# Patient Record
Sex: Female | Born: 1965 | Race: Black or African American | Hispanic: No | Marital: Single | Smoking: Current every day smoker
Health system: Southern US, Community
[De-identification: ages and names within clinical notes are randomized; demographics above are authoritative.]

## PROBLEM LIST (undated history)

## (undated) DIAGNOSIS — I219 Acute myocardial infarction, unspecified: Secondary | ICD-10-CM

## (undated) DIAGNOSIS — G5600 Carpal tunnel syndrome, unspecified upper limb: Secondary | ICD-10-CM

## (undated) DIAGNOSIS — G8929 Other chronic pain: Secondary | ICD-10-CM

## (undated) DIAGNOSIS — M549 Dorsalgia, unspecified: Secondary | ICD-10-CM

## (undated) DIAGNOSIS — R569 Unspecified convulsions: Secondary | ICD-10-CM

## (undated) DIAGNOSIS — K219 Gastro-esophageal reflux disease without esophagitis: Secondary | ICD-10-CM

## (undated) DIAGNOSIS — Z87442 Personal history of urinary calculi: Secondary | ICD-10-CM

## (undated) DIAGNOSIS — D649 Anemia, unspecified: Secondary | ICD-10-CM

## (undated) DIAGNOSIS — I10 Essential (primary) hypertension: Secondary | ICD-10-CM

## (undated) DIAGNOSIS — M1990 Unspecified osteoarthritis, unspecified site: Secondary | ICD-10-CM

## (undated) DIAGNOSIS — D219 Benign neoplasm of connective and other soft tissue, unspecified: Secondary | ICD-10-CM

## (undated) DIAGNOSIS — F17200 Nicotine dependence, unspecified, uncomplicated: Secondary | ICD-10-CM

## (undated) HISTORY — PX: TUBAL LIGATION: SHX77

## (undated) HISTORY — PX: MYOMECTOMY: SHX85

## (undated) HISTORY — PX: ABDOMINAL HYSTERECTOMY: SHX81

---

## 2007-11-20 ENCOUNTER — Emergency Department (HOSPITAL_COMMUNITY): Admission: EM | Admit: 2007-11-20 | Discharge: 2007-11-20 | Payer: Self-pay | Admitting: Emergency Medicine

## 2007-11-26 ENCOUNTER — Inpatient Hospital Stay (HOSPITAL_COMMUNITY): Admission: EM | Admit: 2007-11-26 | Discharge: 2007-11-29 | Payer: Self-pay | Admitting: Emergency Medicine

## 2007-11-26 ENCOUNTER — Ambulatory Visit: Payer: Self-pay | Admitting: Internal Medicine

## 2007-11-28 ENCOUNTER — Ambulatory Visit: Payer: Self-pay | Admitting: Vascular Surgery

## 2007-11-28 ENCOUNTER — Encounter (INDEPENDENT_AMBULATORY_CARE_PROVIDER_SITE_OTHER): Payer: Self-pay | Admitting: *Deleted

## 2007-11-30 ENCOUNTER — Inpatient Hospital Stay (HOSPITAL_COMMUNITY): Admission: EM | Admit: 2007-11-30 | Discharge: 2007-12-06 | Payer: Self-pay | Admitting: Emergency Medicine

## 2007-12-03 ENCOUNTER — Encounter (INDEPENDENT_AMBULATORY_CARE_PROVIDER_SITE_OTHER): Payer: Self-pay | Admitting: Gastroenterology

## 2008-03-31 ENCOUNTER — Emergency Department (HOSPITAL_COMMUNITY): Admission: EM | Admit: 2008-03-31 | Discharge: 2008-03-31 | Payer: Self-pay | Admitting: Emergency Medicine

## 2009-08-20 ENCOUNTER — Encounter: Admission: RE | Admit: 2009-08-20 | Discharge: 2009-08-20 | Payer: Self-pay | Admitting: Internal Medicine

## 2009-10-28 IMAGING — CR DG ABDOMEN ACUTE W/ 1V CHEST
3 series · 3 of 3 positions shown · non-contrast
Comparison: None

CLINICAL DATA: Pain, vomiting

ACUTE ABDOMEN SERIES (ABDOMEN 2 VIEW & CHEST 1 VIEW)

[w chest pa]
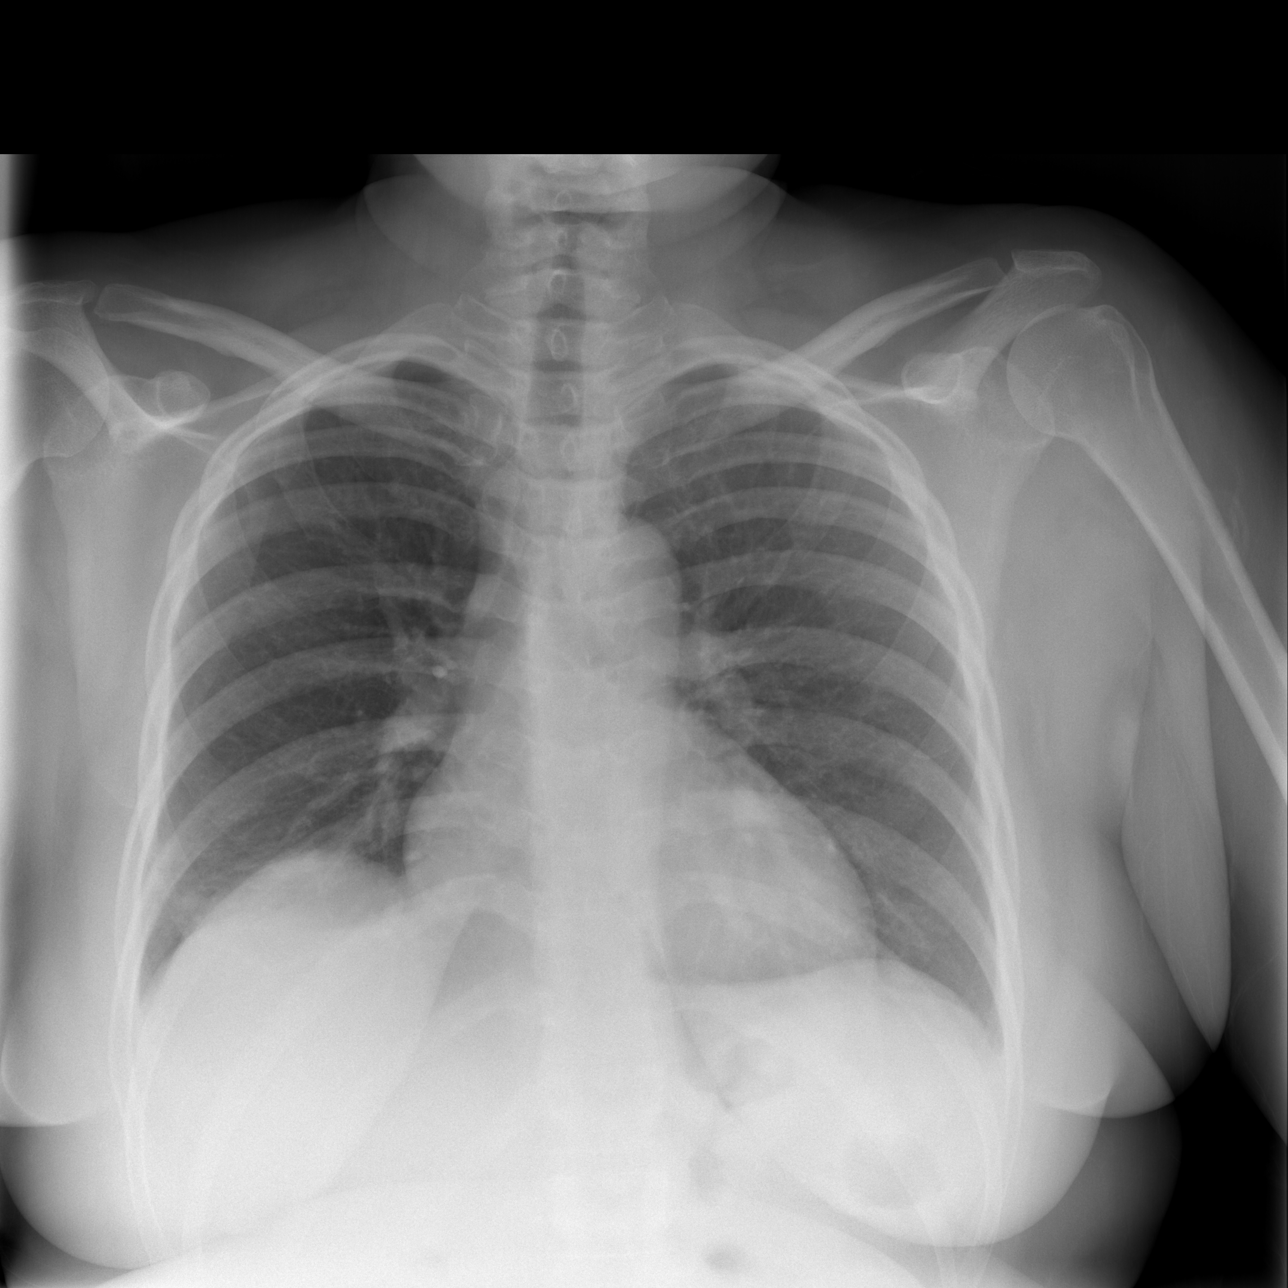

[w abdomen upright *]
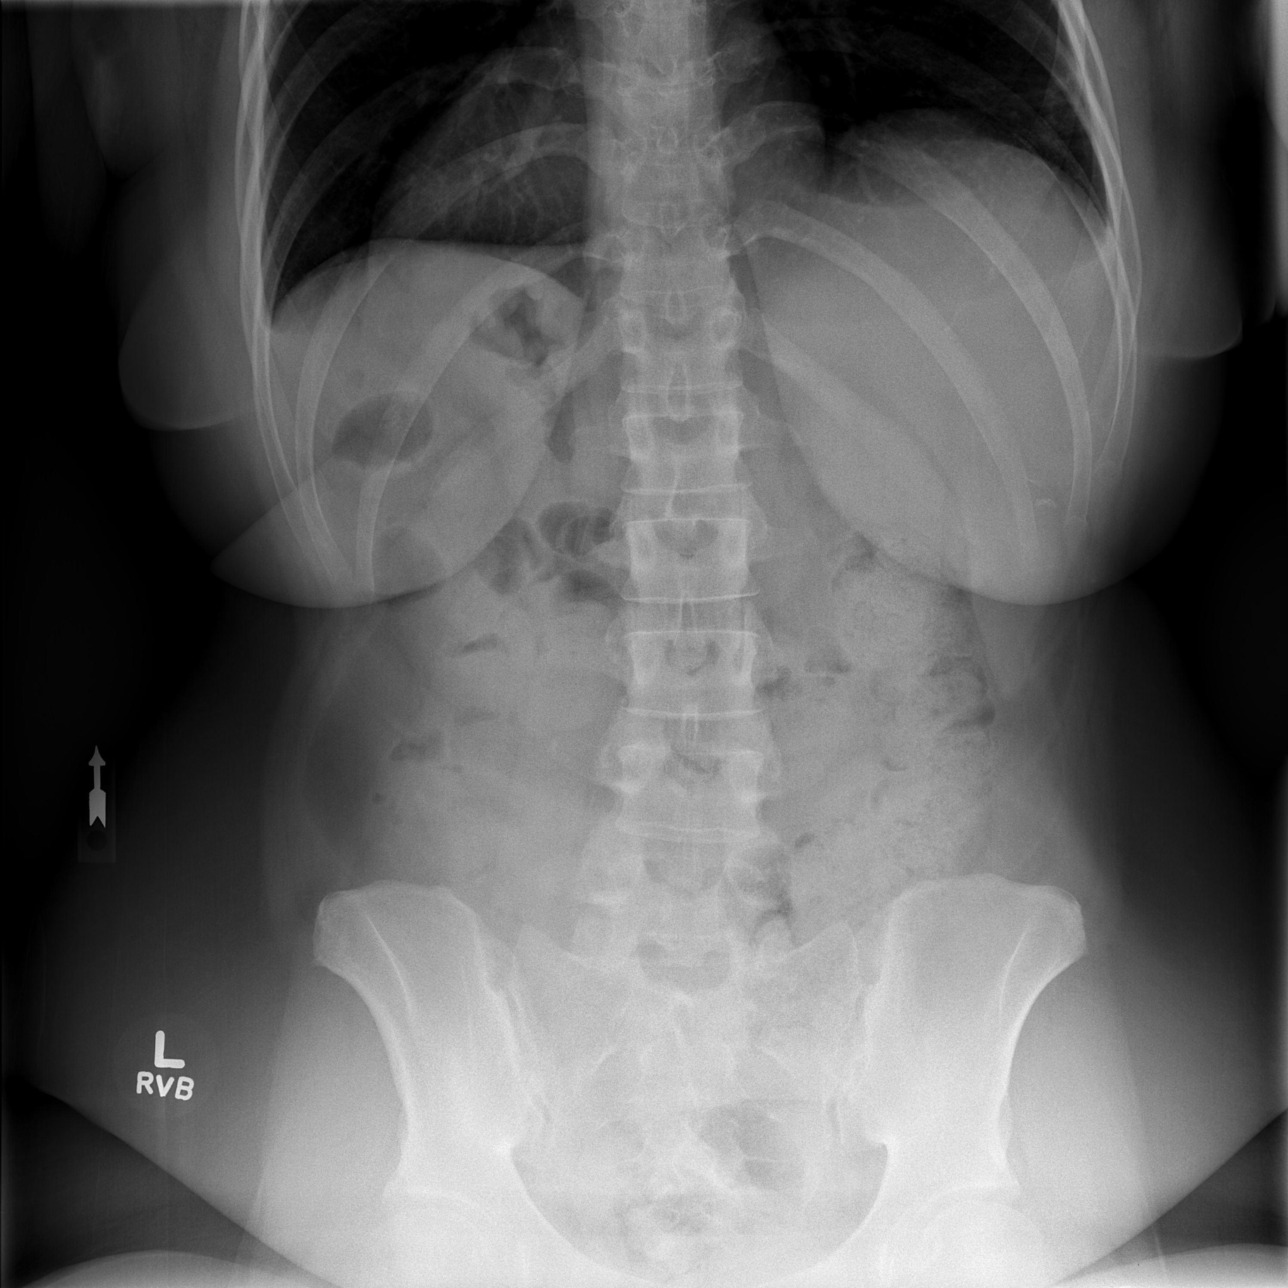

[t abdomen supine *]
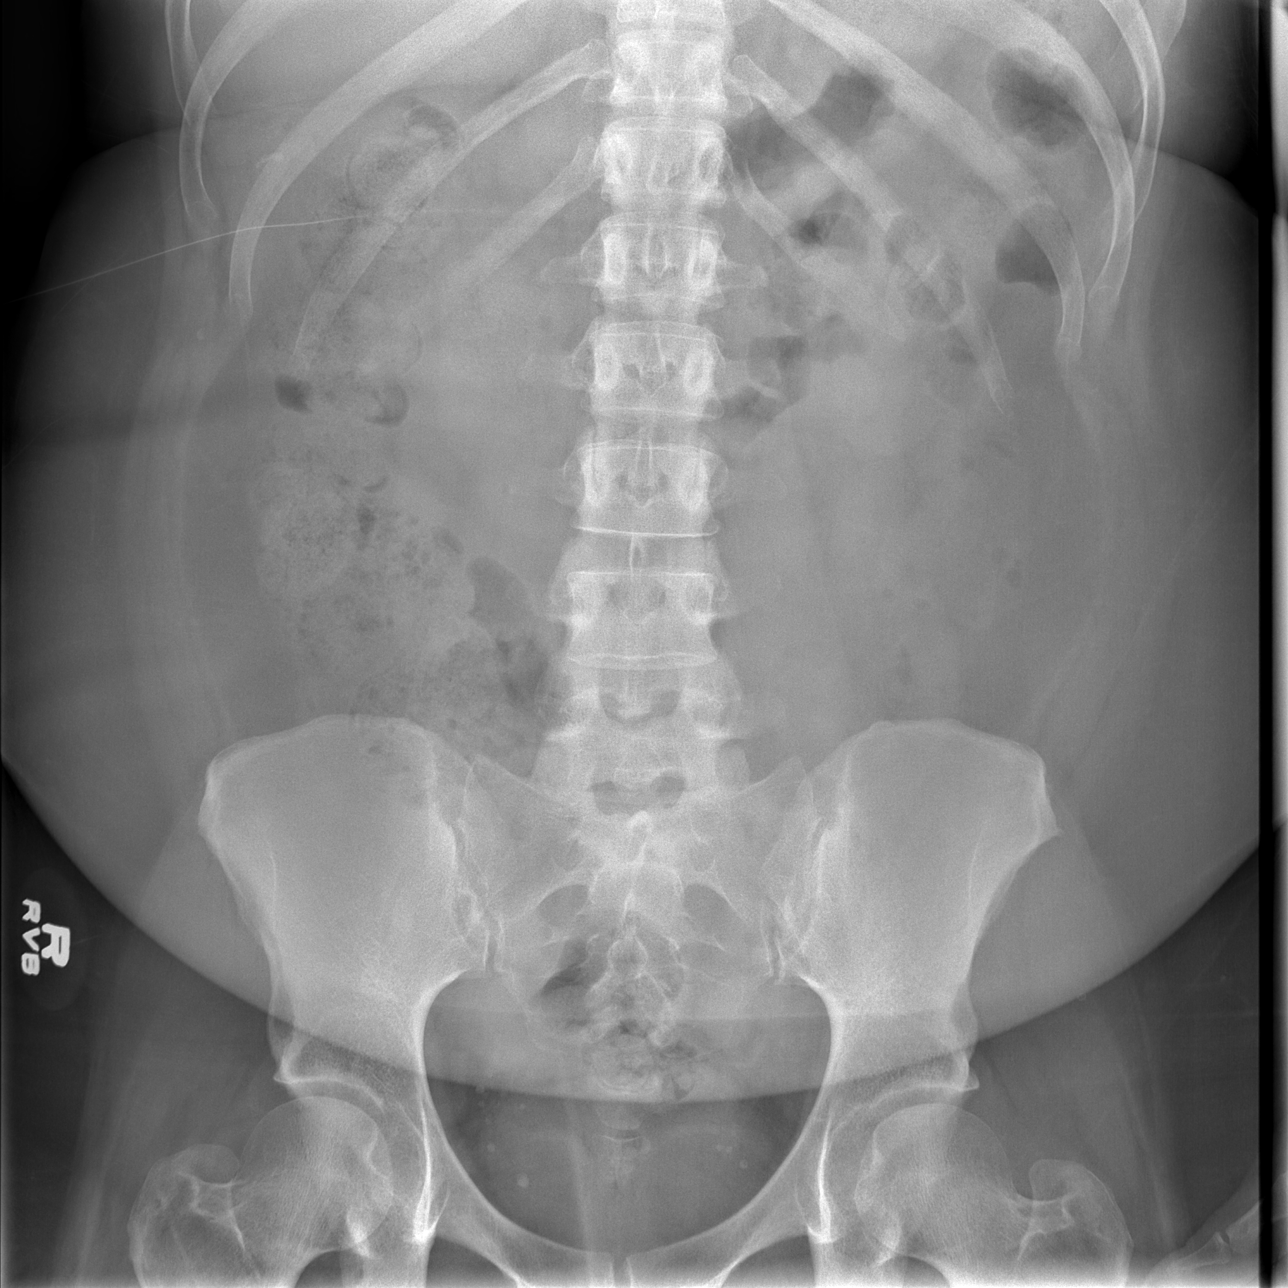

[3 of 3 positions shown; findings below may reference images not displayed]

FINDINGS: There is right lower lobe airspace disease, possibly
early pneumonia.  Left lung is clear.  No effusions.  Heart is
normal size.

There is a nonobstructive bowel gas pattern.  No free air,
organomegaly, or suspicious calcification.

No acute bony abnormality.
IMPRESSION: Right lower lobe opacity, possibly early pneumonia.

No obstruction or free air.

## 2009-10-28 IMAGING — US US ABDOMEN COMPLETE
1 series · 14 of 25 positions shown · non-contrast
Comparison: None

CLINICAL DATA: Vomiting

ABDOMEN ULTRASOUND
TECHNIQUE: Complete abdominal ultrasound examination was performed
including evaluation of the liver, gallbladder, bile ducts,
pancreas, kidneys, spleen, IVC, and abdominal aorta.

[Series 1: unknown · 0.32mm/px · 14 of 69 slices shown]
[im 1/69]
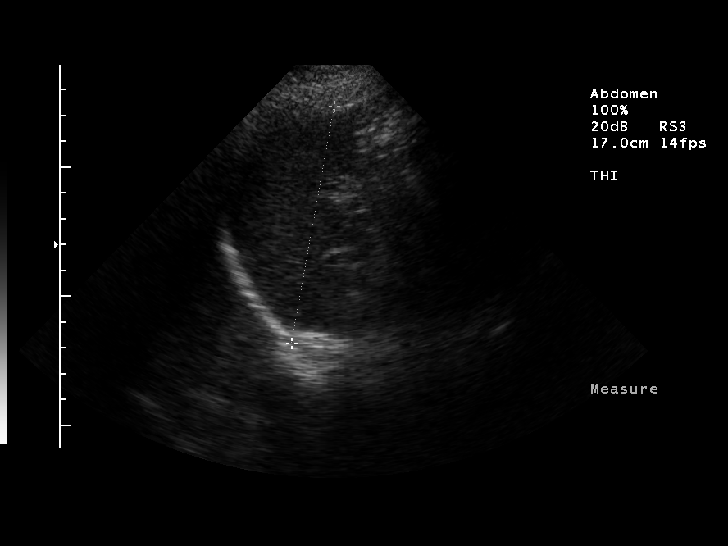
[im 6/69]
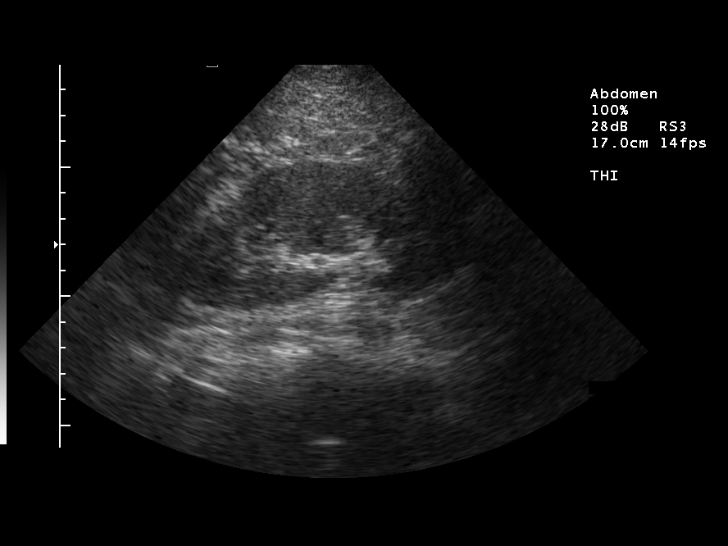
[im 12/69]
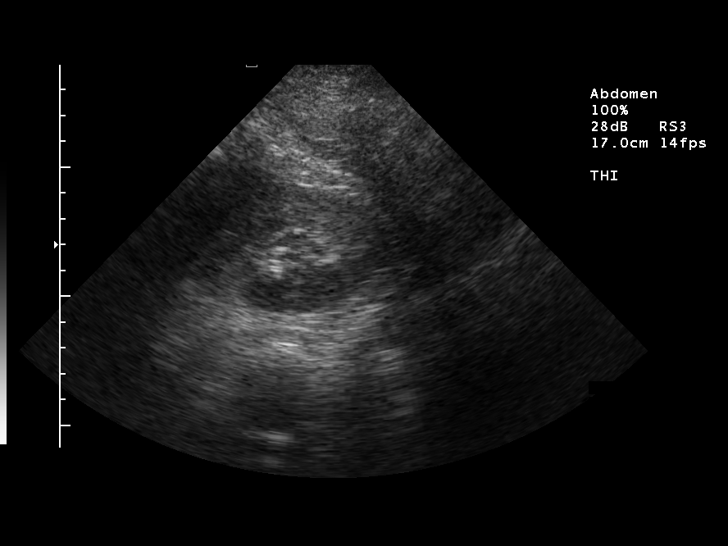
[im 18/69]
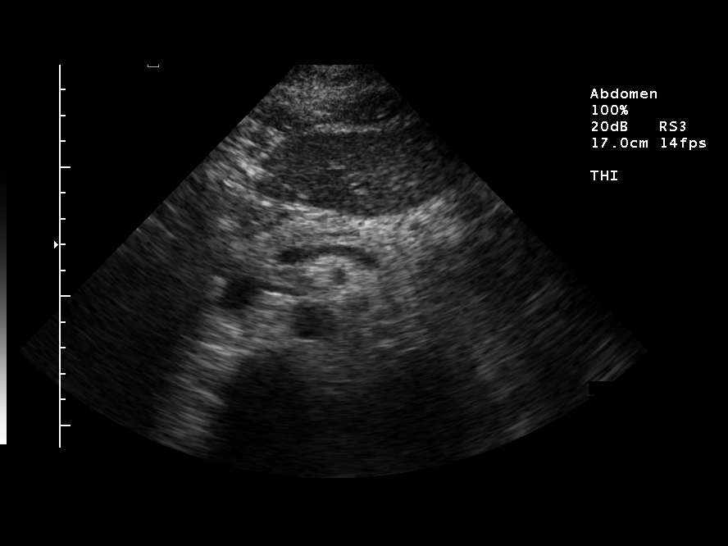
[im 23/69]
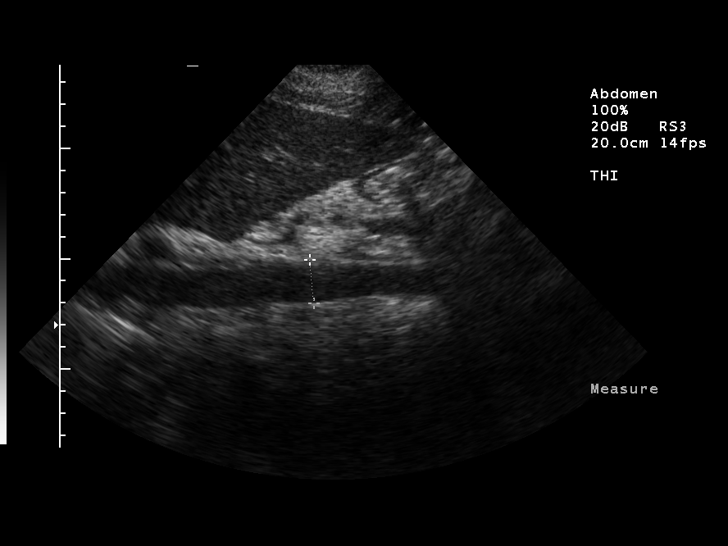
[im 26/69]
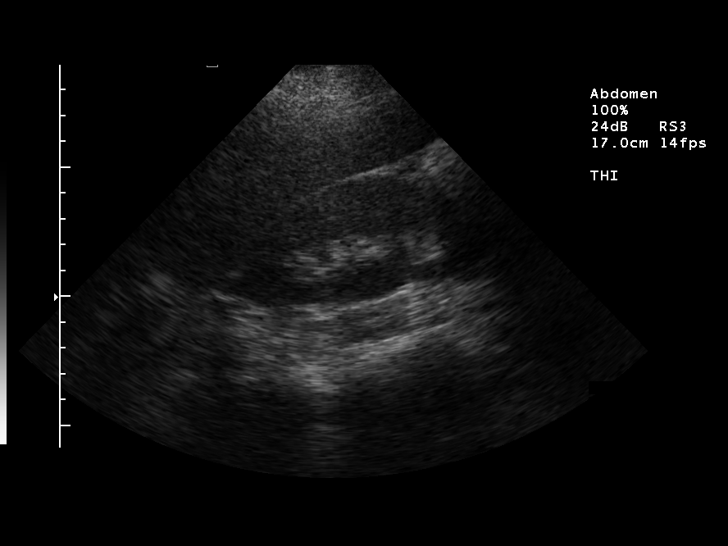
[im 32/69]
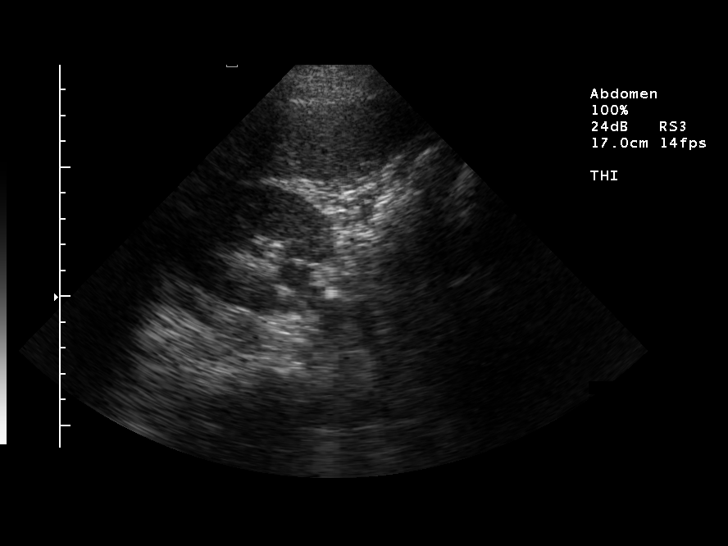
[im 37/69]
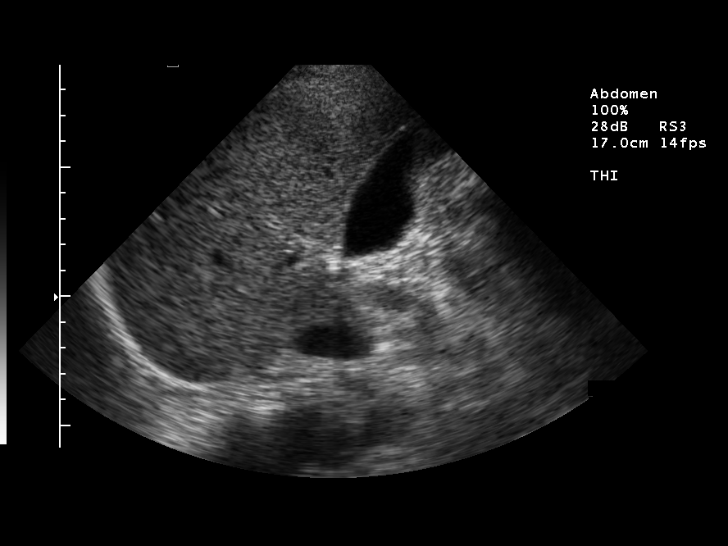
[im 43/69]
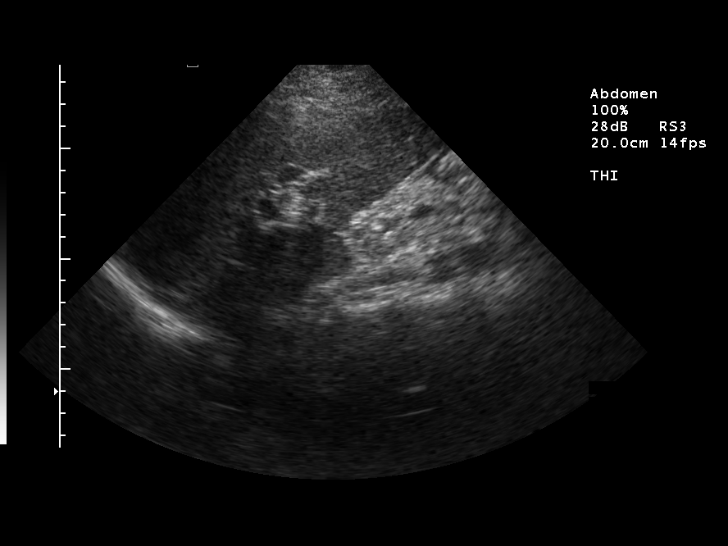
[im 46/69]
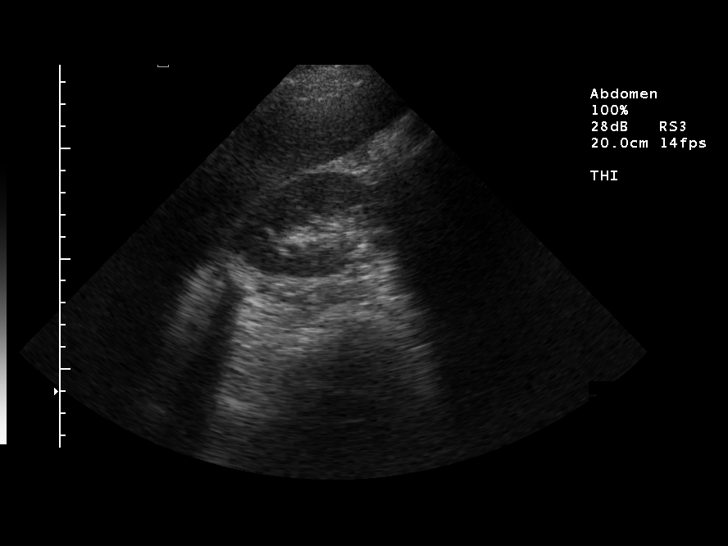
[im 52/69]
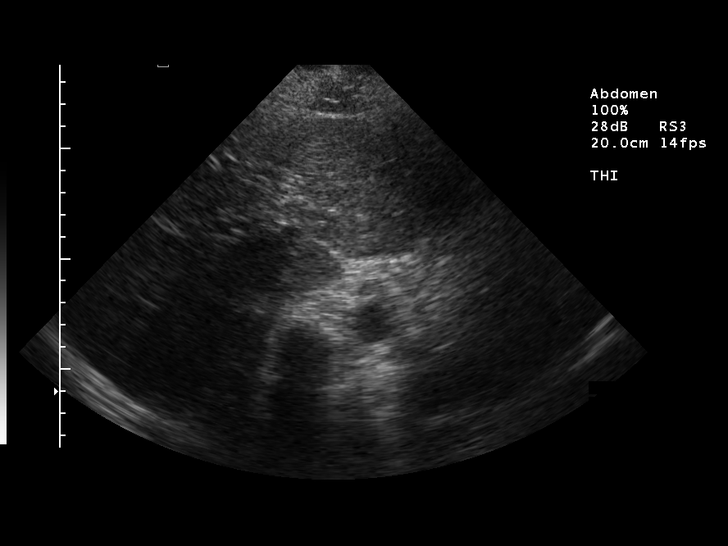
[im 57/69]
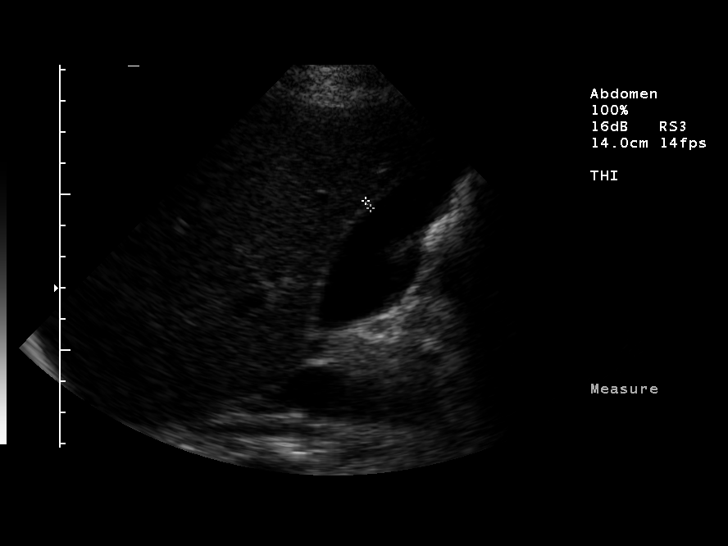
[im 63/69]
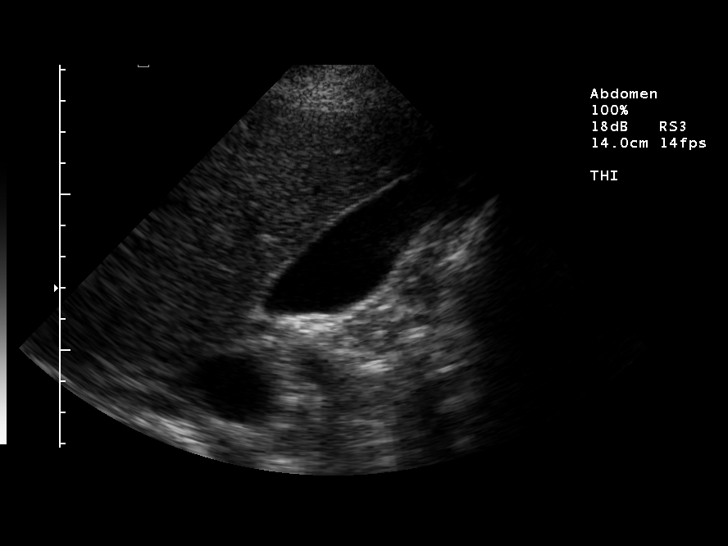
[im 69/69]
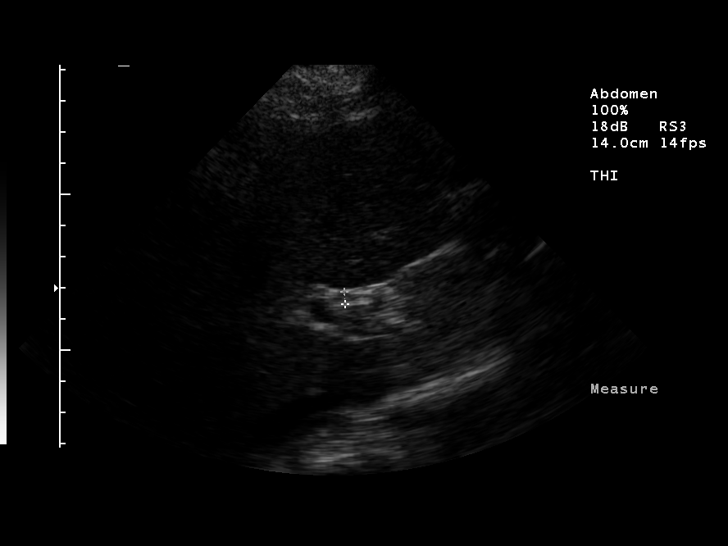

[14 of 25 positions shown; findings below may reference images not displayed]

FINDINGS: Gallbladder:  No gallstones, gallbladder wall thickening,
or pericholecystic fluid.

Common bile duct: Within normal limits in caliber, 4 mm.

Liver:  No focal parenchymal abnormalities.  Within normal limits
in parenchymal echogenicity.

Inferior vena cava:  Visualized portion unremarkable.

Pancreas:  Visualized portion unremarkable.

Spleen:  Within normal limits in size and echogenicity.

Right kidney:  Within normal limits in size and echogenicity. No
evidence of mass or hydronephrosis.

Left kidney:  Within normal limits in size and echogenicity. No
evidence of mass or hydronephrosis.

Abdominal aorta:  Within normal limits in caliber.
IMPRESSION: Unremarkable abdominal ultrasound.

## 2009-11-08 IMAGING — CT CT ABDOMEN W/ CM
1 of 4 series · 12 of 32 positions shown, 18 images · IV contrast (agent unspecified)
Comparison: Plain films of 11/30/2007

CLINICAL DATA: Nausea, vomiting, diarrhea for several days

CT ABDOMEN WITH CONTRAST,CT PELVIS WITH CONTRAST
TECHNIQUE: Multidetector CT imaging of the abdomen was performed
following the standard protocol during bolus administration of
intravenous contrast.,Technique:  Multidetector CT imaging of the
pelvis was performed following the standard protocol during
Contrast: 100 ml [id number]

[Series 2: abd_pel 5.0 b40f st · axial · 0.76mm/px · z∈[-466,-62]mm · 12 of 95 slices shown, 18 images]
[im 7/95  soft-tissue]
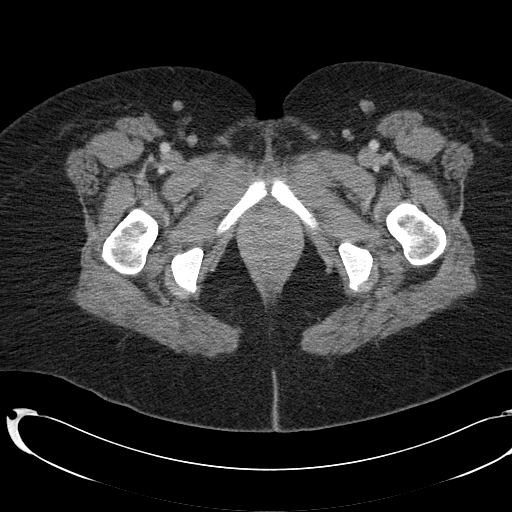
[im 7/95  bone]
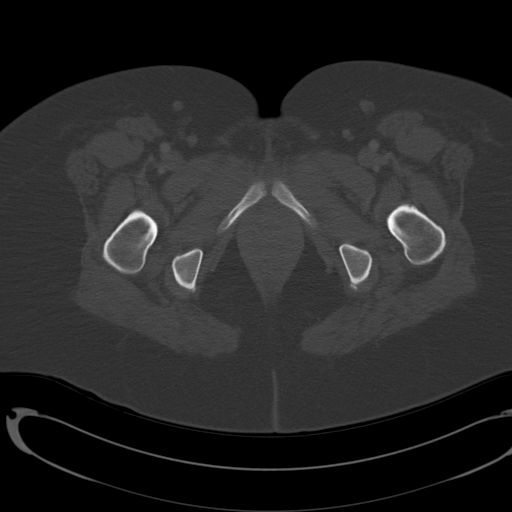
[im 14/95  soft-tissue]
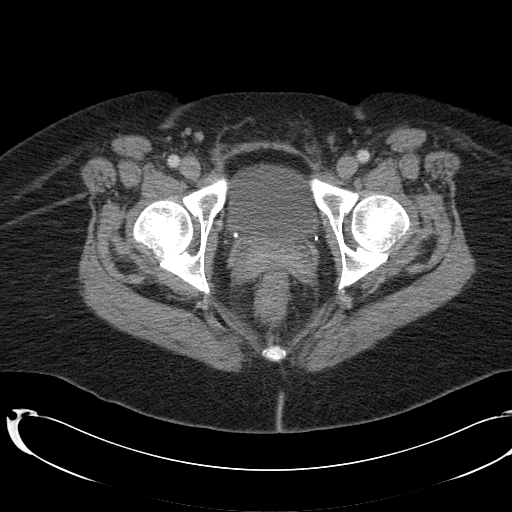
[im 21/95  soft-tissue]
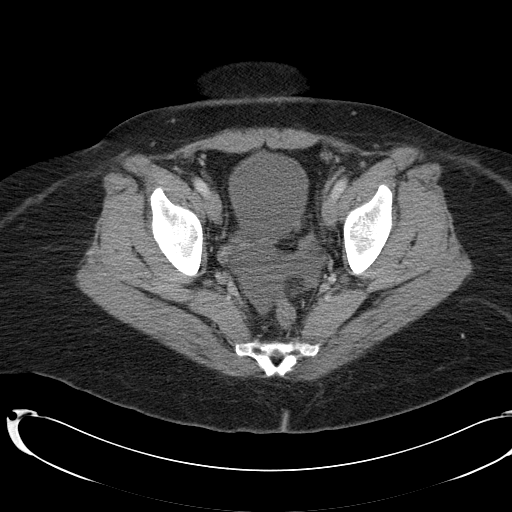
[im 27/95  soft-tissue]
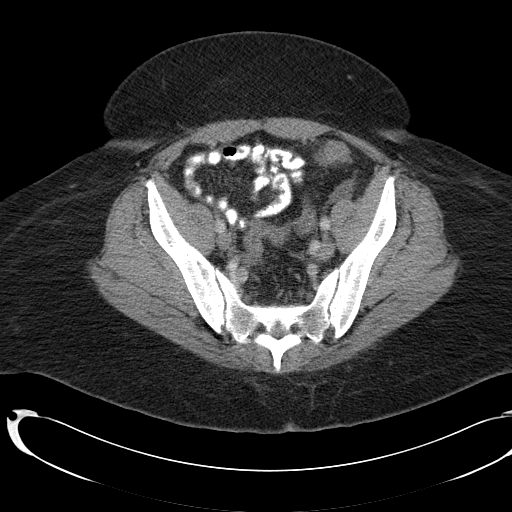
[im 34/95  soft-tissue]
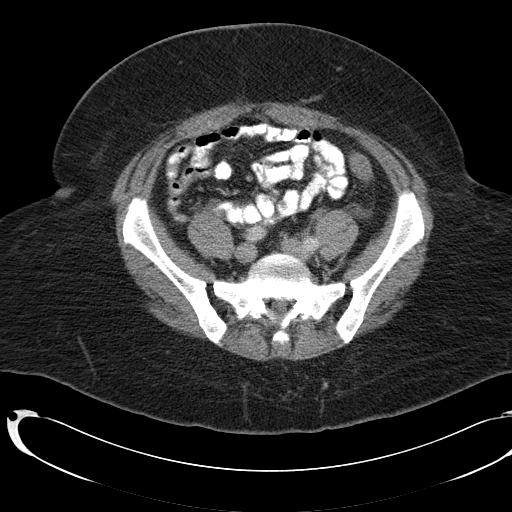
[im 41/95  soft-tissue]
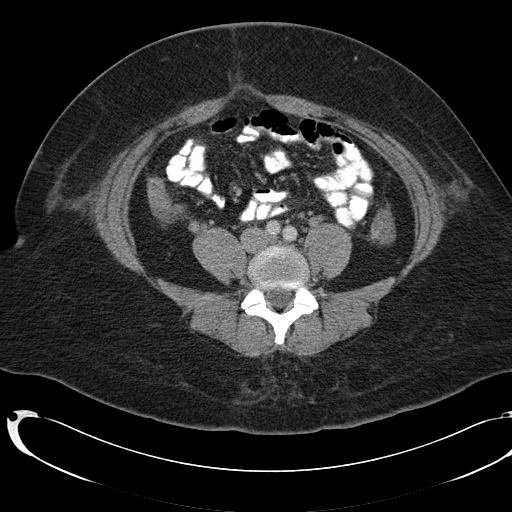
[im 54/95  soft-tissue]
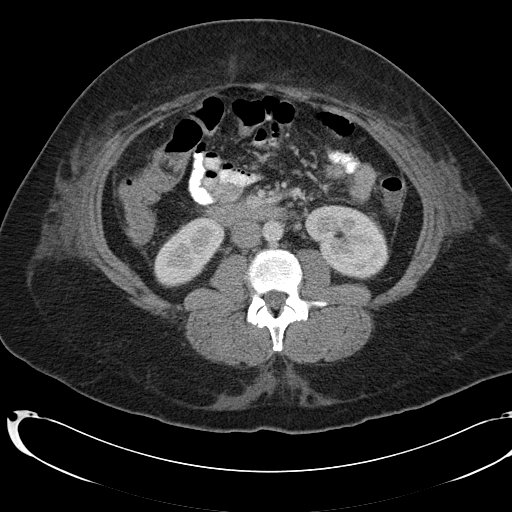
[im 61/95  soft-tissue]
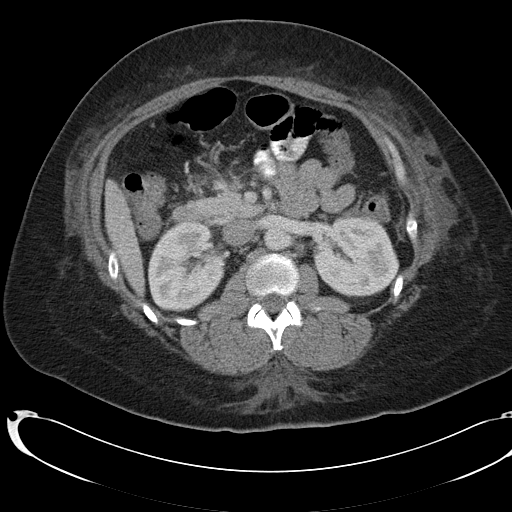
[im 68/95  soft-tissue]
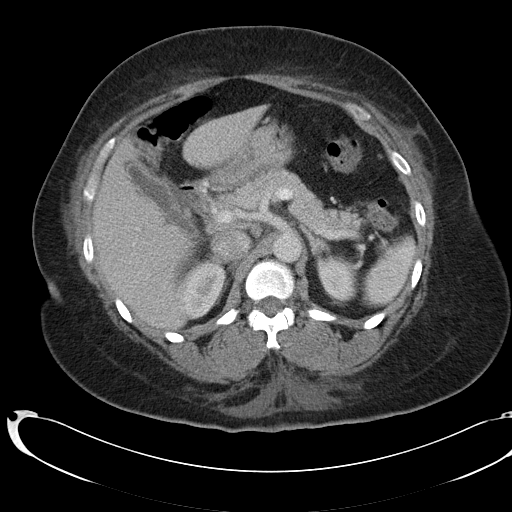
[im 68/95  lung]
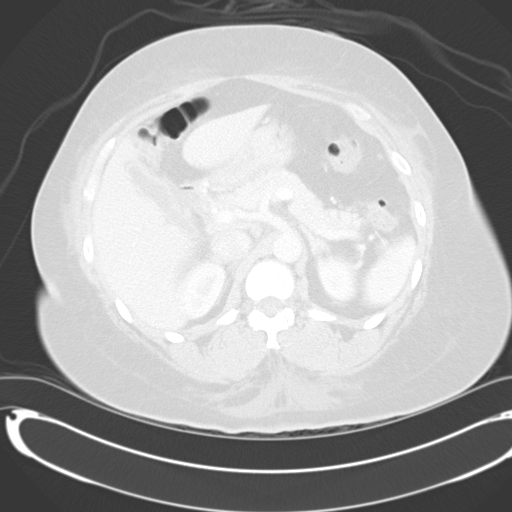
[im 68/95  bone]
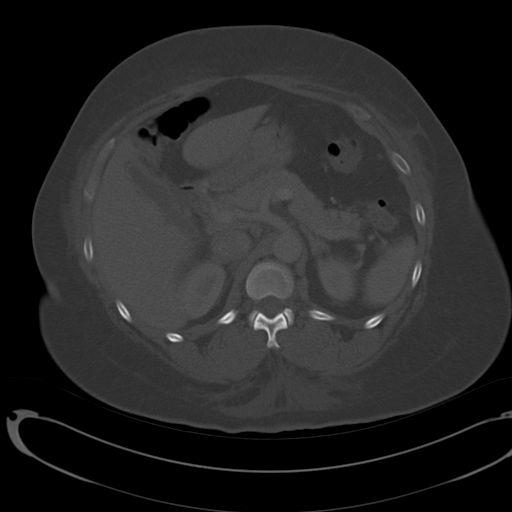
[im 74/95  soft-tissue]
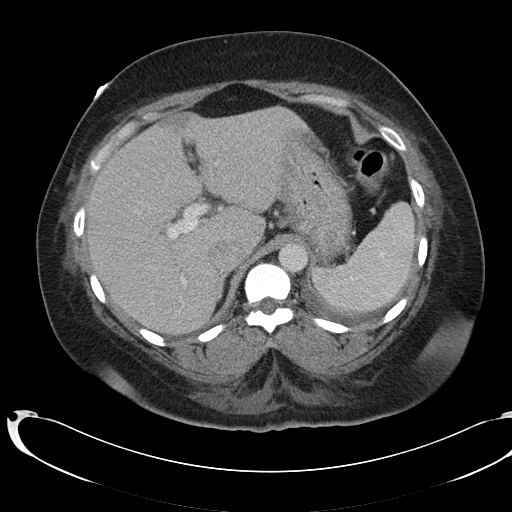
[im 74/95  lung]
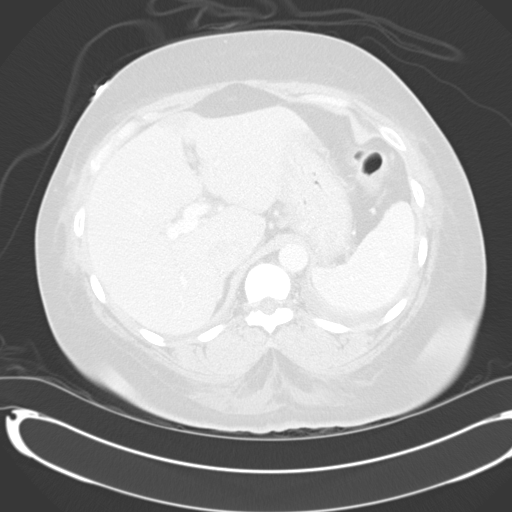
[im 81/95  soft-tissue]
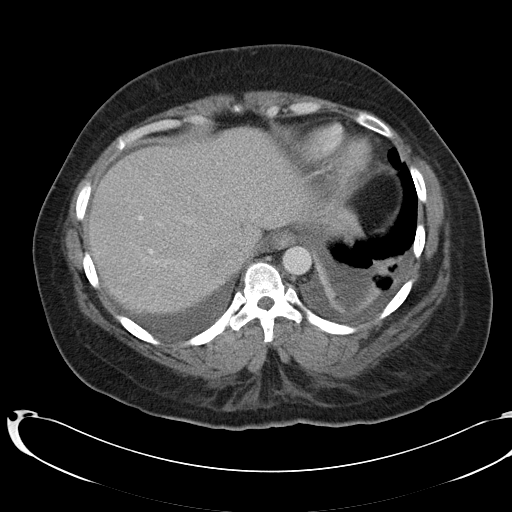
[im 81/95  lung]
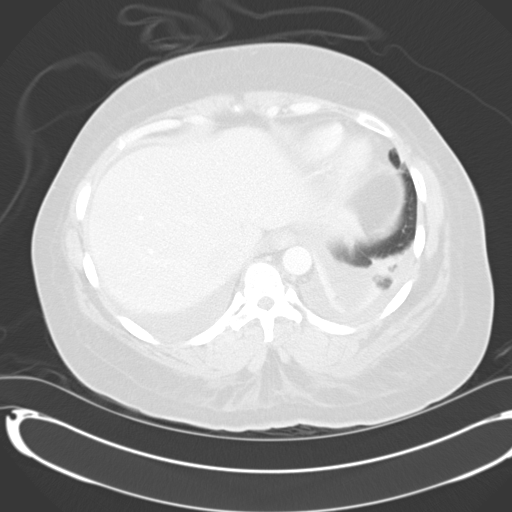
[im 88/95  soft-tissue]
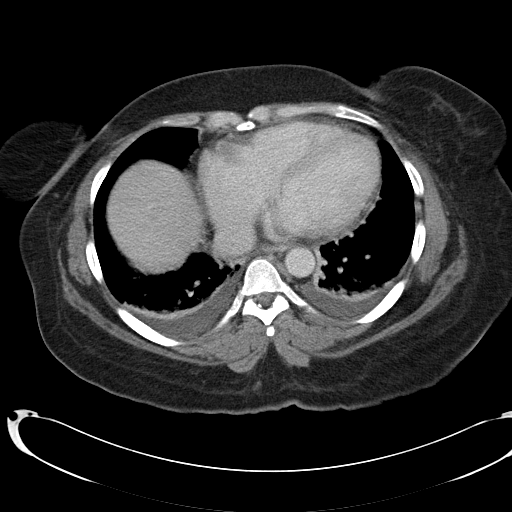
[im 88/95  lung]
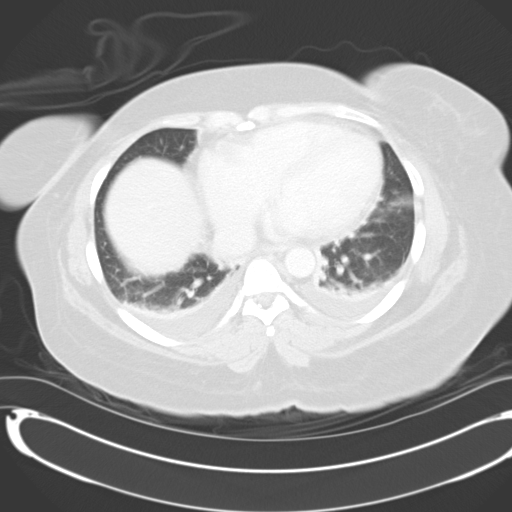

[12 of 32 positions shown; findings below may reference images not displayed]

FINDINGS: There are patchy opacities at both lung bases consistent
with atelectasis, pneumonia, as well as small bilateral pleural
effusions.  The liver enhances with no focal abnormality and no
ductal dilatation is seen.  The gallbladder is somewhat contracted
and elongate, but no calcified gallstones are seen.  The pancreas
is normal in size and the pancreatic duct is not dilated. There is
very slight indistinctness of the fat planes in the region of the
pancreatic clear.  This is nonspecific but could be due to a mild
focal pancreatitis.  Correlation with laboratory values is
recommended.  The adrenal glands and the spleen appear normal.  The
kidneys enhance and on delayed images the pelvocaliceal systems
appear normal. There is a small amount of fluid throughout the
peritoneal cavity, and there is indistinctness of the subcutaneous
soft tissues suggesting a degree of fluid overload.
IMPRESSION: 1.  Opacities in both lung bases with bilateral small pleural
effusions.  Consider atelectasis or pneumonia involving both lower
lobes.
2.  There is a small amount of fluid throughout the peritoneal
cavity with evidence of fluid overload as well.  There is
indistinctness of the fat planes of the pancreatic head and mild
focal pancreatitis cannot be excluded. Correlate with laboratory
values.

CT PELVIS WITH CONTRAST
FINDINGS: There is a moderate amount of free fluid layering
dependently within the pelvis.  The patient appears to have
undergone prior hysterectomy.  Neither ovary is visualized and no
pelvic mass or adenopathy is seen.  Urinary bladder is
unremarkable.  The appendix and terminal ileum appear normal.
IMPRESSION: Moderate amount of free fluid within the pelvis.  No pelvic mass or
adenopathy.  Appendix and terminal ileum appear normal.

## 2010-06-15 ENCOUNTER — Encounter: Payer: Self-pay | Admitting: Internal Medicine

## 2010-09-19 ENCOUNTER — Other Ambulatory Visit: Payer: Self-pay | Admitting: Internal Medicine

## 2010-09-19 DIAGNOSIS — Z1231 Encounter for screening mammogram for malignant neoplasm of breast: Secondary | ICD-10-CM

## 2010-09-25 ENCOUNTER — Ambulatory Visit: Payer: Self-pay

## 2010-10-01 ENCOUNTER — Ambulatory Visit
Admission: RE | Admit: 2010-10-01 | Discharge: 2010-10-01 | Disposition: A | Discharge status: Home / Self Care | Payer: Medicare Other | Source: Ambulatory Visit | Attending: Internal Medicine | Admitting: Internal Medicine

## 2010-10-01 DIAGNOSIS — Z1231 Encounter for screening mammogram for malignant neoplasm of breast: Secondary | ICD-10-CM

## 2010-10-07 NOTE — Op Note (Signed)
[name], [name]               ACCOUNT NO.:  [id number]   MEDICAL RECORD NO.:  [id number]          PATIENT TYPE:  INP   LOCATION:  1445                         FACILITY:  [facility]   PHYSICIAN:  Shirley Friar, MDDATE OF BIRTH:  [birth year]   DATE OF PROCEDURE:  DATE OF DISCHARGE:                               OPERATIVE REPORT   PROCEDURE:  Upper endoscopy.   INDICATIONS:  Nausea, vomiting, diarrhea and abdominal pain.   MEDICINES:  See preceding colonoscopy report.   FINDINGS:  Upper endoscope was inserted through the oropharynx and the  esophagus was intubated, which was normal in its entirety.  Endoscope  was advanced down to the stomach, which revealed normal stomach mucosa.  Retroflexion was done, which revealed a normal proximal stomach.  The  endoscope was straightened and advanced into the duodenal bulb and  second portion of the duodenum, which were both normal.  The endoscope  was withdrawn to confirm the above findings.   ASSESSMENT:  1. Normal upper endoscopy.  2. Suspect symptoms secondary to gastroenteritis.   PLAN:  1. Advance diet.  2. Hold off on small bowel evaluation if symptoms improve.      Shirley Friar, MD  Electronically Signed     VCS/MEDQ  D:  12/03/2007  T:  12/03/2007  Job:  [id number]

## 2010-10-07 NOTE — Discharge Summary (Signed)
[name], [name]               ACCOUNT NO.:  [id number]   MEDICAL RECORD NO.:  [id number]          PATIENT TYPE:  INP   LOCATION:  1232                         FACILITY:  [facility]   PHYSICIAN:  Hind I Elsaid, MD      DATE OF BIRTH:  1965/07/15   DATE OF ADMISSION:  11/30/2007  DATE OF DISCHARGE:  12/06/2007                               DISCHARGE SUMMARY   DISCHARGE DIAGNOSES:  1. Intractable nausea, vomiting and diarrhea felt to be secondary to      acute gastroenteritis, resolved.  2. Hypokalemia.  3. Tonic clonic seizure, in a patient with history of seizures.  4. 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  5. Obesity.  6. Hypertension.   SECONDARY DIAGNOSES:  1. History of chronic back pain.  2. History of carpal tunnel syndrome.  3. History of arthritis.   MEDICATIONS:  1. Carbamazepine 100 mg p.o. daily for 3 days, then carbamazepine 200      mg in the morning, 100 mg at midday and 200 mg at bedtime x4 days,      then 200 mg p.o. t.i.d.  2. Potassium chloride 10 mEq daily.  3. Clonidine 0.1 mg daily.  4. Potassium chloride 10 mEq p.o. daily.   PROCEDURES:  1. CT abdomen and pelvis with small bilateral effusion, consider      abscesses or pneumonia.  Small amount of fluid throughout the      peritoneal cavity with evidence of fluid overload.  Modest amount      of free fluid within the pelvis.  No pelvic mass or adenopathy.  2. Ultrasound of the pelvis negative for adnexal mass.  3. CT head without contrast:  No intracranial finding.  4. EEG done.  Result is pending.  Will be followed by Dr. Sharene Skeans      from [facility].   CONSULTATIONS:  1. Shirley Friar, MD, gastroenterology.  2. Deanna Artis. Sharene Skeans, M.D., [facility].   HISTORY OF PRESENT ILLNESS:  1. Please review the history done by Hind I Elsaid, MD.  In summary,      [name] is a 45 year old African American female who was      recently discharged  on November 29, 2007, after diagnosis of acute      diarrheal illness and hypovolemic shock, status post fluid      resuscitation with pressor and antibiotic with significant      improvement of her situation.  Patient discharged one day and came      back with the same symptoms, mainly nausea, vomiting and diarrhea.      Patient admitted to the hospital for evaluation.  CT abdomen and      pelvis, there is no acute abdominal finding.  Amylase and lipase      within normal limits.  Dr. Charlott Rakes consulted from      gastroenterology with his plan to go ahead with colonoscopy and      EGD, which was done on December 03, 2007.  The result was normal colon  and normal EGD.  The patient most probably has gastroenteritis and      advanced diet recommended.  During hospitalization, patient      continued to have less frequent diarrhea with less frequent bowel      movements but continued to complain of abdominal pain.  Because of      abdominal pain felt to be secondary to gastroenteritis and as we      mentioned, patient had CT abdomen and pelvis that did not show any      evidence of appendicitis or cholecystitis.  Patient's liver      function tests were within normal limits.  Advancing diet under gun      and patient tolerated regular diet very well.  Flagyl and Cipro was      discontinued as the patient has no evidence of infection.   On December 04, 2007, patient noticed to have tonic clonic seizure.  On-call  physician informed.  Patient started on Keppra and transferred to  stepdown.  Noticed patient has history of seizures and was not on any  seizure medications.  Dr. Sharene Skeans from neurology was consulted.  He  recommended to start the patient on carbamazepine.  No other seizure was  noted.  Dr. Sharene Skeans did follow the patient and from his neurological  standpoint, patient can follow up with him as an outpatient and call him  within one week to evaluate how she is tolerating  carbamazepine.  As  mentioned, patient had EEG done today, results are pending.   During hospitalization, patient's vital signs remained stable and has  evidence of hypertension.  For that, clonidine 0.1 mg p.o. was added.   DISPOSITION:  Patient will be discharged home.  Follow with Dr. Sharene Skeans  within one week and will give you an appointment to see [name], M.D., for your primary care physician.  Phone number for both  physicians was given to the patient and instructions for using  carbamazepine was also given to the patient.      Hind Bosie Helper, MD  Electronically Signed     HIE/MEDQ  D:  12/06/2007  T:  12/06/2007  Job:  [id number]

## 2010-10-07 NOTE — Procedures (Signed)
CLINICAL HISTORY:  A 45 year old female with a remote history of  seizure.  She had  generalized tonic-clonic activity on December 04, 2007,  in the hospital when she was admitted for pneumonia, fever,  gastroenteritis for 2 weeks.   CURRENT MEDICATIONS:  Lovenox, potassium, Carafate, Tegretol, Keppra,  Phenergan, Ativan, and Dilaudid.   TECHNICAL COMMENT:  An 18-channel EEG was performed based on standard  international 10-20 system.  An 17-channel demonstrated a normal sinus  rhythm of 66.   Upon awakening, the posterior background activity was low amplitude and  with frequency of 9-10 Hz, reactive to eye opening and closure.  There  was no epileptiform discharge recorded.  Hyperventilation and photic  stimulation was omitted.   The patient was drowsy during recording, but no deeper stage of sleep  was achieved.   CONCLUSION:  This is a normal awake EEG.  There is no evidence of  epileptiform discharge.      Levert Feinstein, MD  Electronically Signed     EA:[id number]  D:  12/06/2007 21:43:40  T:  12/07/2007 07:40:23  Job #:  [id number]

## 2010-10-07 NOTE — Discharge Summary (Signed)
[name], OLDFIELD               ACCOUNT NO.:  [id number]   MEDICAL RECORD NO.:  [id number]          PATIENT TYPE:  INP   LOCATION:  1525                         [facility]ACILITY:  [facility]   PHYSICIAN:  Mobolaji B. Bakare, M.D.DATE OF BIRTH:  11/05/1965   DATE OF ADMISSION:  11/26/2007  DATE OF DISCHARGE:  11/29/2007                               DISCHARGE SUMMARY   PRIMARY CARE PHYSICIAN:  Unassigned.   FINAL DIAGNOSES:  1. Acute diarrheal illness.  2. Hypovolemic shock.  3. Acute renal failure, resolved.  4. Electrolyte abnormalities, corrected.  5. Chest pain felt to be reflux disease.   SECONDARY DIAGNOSES:  1. History of hypertension.  2. History of arthritis.  3. Chronic back pain.  4. Carpal tunnel syndrome.   PROCEDURES:  1. VQ scan showed low probability for pulmonary embolism.  2. PICC line insertion with post PICC line insertion chest x-ray      showing tip of left jugular central venous catheter in the right      subclavian vein, no pneumothorax.  This was readjusted and a follow-      up chest x-ray showed tip of central line in superior vena cava.   BRIEF HISTORY:  Please refer to the admission H&P for full details.  In  brief, Ms. [name] is a 45 year old [place] American female who recently  relocated from [place], [place], to [place], [place].  She  has not settled in with any primary care physician as of yet.  She was  in her usual state of health until about November 20, 2007, when she  presented to the emergency room with nausea, vomiting and abdominal  pain.  She had fever, diarrhea and some respiratory symptoms at that  time.  She had a chest x-ray which showed right lower lobe infiltrate  and she was started on Avelox.  She also had an ultrasound of the  abdomen during that visit.  This was unremarkable.  The patient was  discharged home from the emergency room.  She re-presented to the  emergency room on November 26, 2007, with nausea, vomiting and  nonbloody  diarrhea.  She admitted to history of contact (a  niece had diarrhea as  well).  She was found to be in shock.  She was fluid resuscitated.  Despite 3 liters of IV fluid, MAP remains 55-65.  She was in acute renal  failure with BUN and creatinine of 30 and 2.7.  These were previously  normal 6 days earlier, with BUN of 10 and creatinine of 0.77.   She also complained of chest pain but this was felt to be related to  reflux disease.  She underwent a VQ scan which was negative for PE.  EKG  showed nonspecific ST changes.  Cardiac markers were essentially normal.  The patient was initially admitted to the pulmonary and critical care  unit  and care was provided by critical care physicians.   HOSPITAL COURSE:  She was started on pressors and continued on IV fluid.  She received IV Solu-Medrol and placed on sepsis protocol.  She was also  started  on antibiotic, ceftriaxone for Salmonella and Shigella and IV  Flagyl for C. difficile colitis.  Cultures all came back negative.  Antibiotic has been narrowed to p.o. Flagyl.  Ova and parasite stool  result is pending at the time of dictation.  The patient will be  discharged home on p.o. Flagyl and follow up result of ova and parasites  with her primary care physician.  She was titrated off vasopressors.  Cortisol level was normal;  hence, IV Solu-Cortef was discontinued.  The  patient is currently doing well.  Stool consistency has improved.  Bowel  movement is about two a day.  She was on blood pressure medications  prior to hospitalization.  This has been held temporarily.  I have  instructed her to check her blood pressure and show records to her  primary care physician early next week.   Acute renal failure.  This has resolved with aggressive IV fluid and  vasopressor resuscitation.  BUN and creatinine at the time of discharge  were 13 and 0.96, respectively.  Diovan and hydrochlorothiazide are  currently on hold.   Electrolyte  abnormalities.  She had hypokalemia, hypocalcemia and these  were corrected.   Chest pain.  She had complained of chest pain at the time of  presentation.  Cardiac markers were unremarkable.  EKG showed no acute  changes.  VQ scan showed low probability for PE.  Chest pain has  resolved.  This was felt to be secondary to reflux disease.  The patient  was discharged on Prilosec OTC for two weeks.   DISCHARGE MEDICATIONS:  1. Multivitamin one daily.  2. Soma 350 daily.  3. Lortab 10 mg/500 p.r.n.  4. Flagyl 500 mg t.i.d. for 5 days.  5. Prilosec OTC once daily for 2 weeks.   DISCHARGE INSTRUCTIONS:  1. Follow up with Dr. Mikeal Hawthorne in 5-7 days.  2. Check blood pressure daily and show records to her doctor.  3. Follow up result of stool specimen.      Mobolaji B. Corky Downs, M.D.  Electronically Signed     MBB/MEDQ  D:  11/29/2007  T:  11/29/2007  Job:  [id number]   cc:   Lonia Blood, M.D.
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
## 2010-10-07 NOTE — Op Note (Signed)
NAMEJOLA, CRITZER               ACCOUNT NO.:  [id number]   MEDICAL RECORD NO.:  [id number]          PATIENT TYPE:  INP   LOCATION:  1445                         FACILITY[facility]  [facility]   PHYSICIAN:  Shirley Friar, MDDATE OF BIRTH:  08/06/1965   DATE OF PROCEDURE:  DATE OF DISCHARGE:                               OPERATIVE REPORT   PROCEDURE:  Colonoscopy.   INDICATIONS:  Nausea, vomiting, diarrhea and abdominal pain.   MEDICATIONS:  Fentanyl 125 mcg IV, Versed 12 mg IV, Phenergan 12.5 mg  IV.   FINDINGS:  Rectal exam was normal.  A pediatric Pentax colonoscope was  inserted through an adequately-prepped colon and advanced to the cecum,  where the ileocecal valve and appendiceal orifice were identified.  In  order to reach the cecum, abdominal pressure had to be applied and  repeated loop reduction was necessary.  There was excessive looping in  the proximal colon.  Careful evaluation of the colonic mucosa in the  cecum was unremarkable.  Intubation of the terminal ileum was  unsuccessful despite several attempts.  On careful withdrawal of the  colonoscope, no mucosal abnormalities were seen.  Random biopsies were  taken of the left side of the colon for histology.  Retroflexion in the  rectum was unremarkable.   ASSESSMENT:  1. Normal colonoscopy.  2. Status post biopsies to check for microscopic colitis.   PLAN:  Proceed with upper endoscopy.      Shirley Friar, MD  Electronically Signed     VCS/MEDQ  D:  12/03/2007  T:  12/03/2007  Job:  [id number]

## 2010-10-07 NOTE — H&P (Signed)
[name], [name]               ACCOUNT NO.:  [id number]   MEDICAL RECORD NO.:  [id number]          PATIENT TYPE:  INP   LOCATION:  0102                         FACILITY:  [facility]   PHYSICIAN:  Lauralee Evener, MDDATE OF BIRTH:  Nov 25, 1965   DATE OF ADMISSION:  11/26/2007  DATE OF DISCHARGE:                              HISTORY & PHYSICAL   CHIEF COMPLAINT:  Chest pain.   HISTORY OF PRESENT ILLNESS:  [name] is a 45 year old African  American woman who presented to the emergency department with 2 days of  worsening chest pain and shortness of breath.  She was evaluated in the  [facility] emergency department on November 20, 2007.  She was diagnosed  with a pneumonia and given a prescription of antibiotics.  With that,  she had improvement in her cough and fever.  But she continued to have  approximately two episodes of nausea and vomiting for the last week.  The vomit is yellow to clear.  She has had no hematemesis.  She has also  had diarrhea for the last 3 days.  She had no blood or stools.  With  this, she has become quite dehydrated.  Her niece has had vomiting as  well.  Last night she had a sudden onset of substernal chest pain.  The  chest pain is not radiating.  The pain is worse with deep inspiration.  Pain has been 10/10 intermittent, spells last 10-15 minutes.  Nothing  makes it worse other than shortness of breath.  It is associated with  mild nausea and some sweats.  [name] also complains of bilateral  lower extremity tenderness for the last week.  She feels like her legs  are red but not warm to touch.  She denies lower extremity swelling.  For the last couple days she has had dizziness and mild lightheadedness.  Other complaint is neuropathic pain in her feet her hands.  She denies  any fever since her treatment with antibiotics one week ago.  [name]  does report one episode where she feels she may have aspirated some of  her vomit.  [name] denies a  history of blood clot.  She has had one  miscarriage.   REVIEW OF SYSTEMS:  A comprehensive 10-point review of systems was  obtained and was negative except as per HPI.   PAST MEDICAL HISTORY:  1. Arthritis.  2. Carpal tunnel syndrome.  3. Chronic back pain.  4. Hypertension.  5. Four pregnancies, two live births, one miscarriage, one stillbirth.   SOCIAL HISTORY:  She lives in [place] with her sisters.  She is a 30  pack-year smoker.  Currently smokes one-half pack per day.  She denies  alcohol use.   FAMILY HISTORY:  No clotting history, but multiple miscarriages.  Hypertension, diabetes, arthritis.   ALLERGIES:  ASPIRIN, PENICILLIN.   MEDICATIONS:  1. Diovan/HCT 320/25 daily.  2. Lortab 10/500 as needed.  3. Soma 350 mg daily.  4. Ibuprofen 800 mg as needed.  5. Multivitamin.  6. Colace 100 mg daily.   PHYSICAL EXAMINATION:  VITAL SIGNS:  On admission, temperature 37.9,  blood pressure 82/59, pulse 92, respiratory 28, O2 sat 98% on room air.  GENERAL:  Obese woman in mild discomfort in no acute distress.  HEENT:  Conjunctivae and oropharynx clear.  NECK:  Supple.  No increased JVP.  HEART:  Regular with no murmurs.  LUNGS:  Clear to auscultation bilaterally.  ABDOMEN:  Soft, nontender, positive bowel sounds.  EXTREMITIES:  Warm with no edema.  NEUROLOGICAL:  No focal deficits.  MUSCULOSKELETAL:  With tenderness to palpation in the calves  bilaterally.  SKIN:  No redness or warmth in the lower extremities.   DATA REVIEWED:  1. Chest x-ray November 26, 2007:  No acute cardiopulmonary disease.  Low      lung volumes.  2 . White count 9.7, hemoglobin 12.9, platelets 360.  1. Sodium 140, potassium 2.6, chloride 101, glucose 103, BUN 30,      creatinine 2.7.  2. CK-MB less than one, troponin than 0.05, myoglobin 127.  3. EKG with non-specific ST changes.   ASSESSMENT/PLAN:  1. Chest pain, shortness of breath and hypotension:  There is concern      that [name] may  have a PE that is leading to her hypotension      and causing her chest pain and shortness of breath.  She has been      sedentary over the last week with her pneumonia and nausea,      vomiting, diarrhea.  We will initiate a heparin drip while we      workup for a PE with a VQ scan and lower extremity ultrasounds.  We      will also rule out for cardiac ischemia with three sets of cardiac      enzymes, although, history does not support this.  I also thinks      that aortic resection is unlikely given pattern for symptoms as      well as no evidence of mediastinal widening on the chest x-ray.      For her hypotension, we will continue to bolus her with fluids.      She has had over a liter in the emergency room so far.  We will      give her approximately 4 liters and then reevaluate.  We will admit      her to the step-down unit for closer monitoring.  2. Acute renal insufficiency:  Likely prerenal in nature given the      fact that [name] has had significant nausea, vomiting for a      week with decreased p.o. intake.  We will check urine electrolytes      to calculate her FENA.  We will hydrate her with fluids as above.      We will avoid any nephrotoxin.  3. Hypokalemia:  It is likely secondary to her diarrhea and vomiting.      We will replace her potassium and follow daily.  4. Chronic back pain:  [name] is on a number medications at home      for this.  We will give her Tylenol and oxycodone as needed while      in the hospital to minimize hypotensive effects of stronger      narcotics.  5. Hypertension:  [name] is currently hypotensive.  We will hold      her Diovan/HCT.      Lauralee Evener, MD  Electronically Signed     WT/MEDQ  D:  11/26/2007  T:  11/26/2007  Job:  [id number]

## 2010-10-07 NOTE — H&P (Signed)
NAMEANISIA, LEIJA               ACCOUNT NO.:  [id number]   MEDICAL RECORD NO.:  [id number]          PATIENT TYPE:  EMS   LOCATION:  ED                           FACILITY:  [facility]   PHYSICIAN:  Hind I Elsaid, MD      DATE OF BIRTH:  1966/04/11   DATE OF ADMISSION:  11/30/2007  DATE OF DISCHARGE:                              HISTORY & PHYSICAL   CHIEF COMPLAINT:  Intractable vomiting and diarrhea and abdominal pain.   HISTORY OF PRESENT ILLNESS:  Mrs. Dockstader is a 45 year old African  American female who was recently discharged from [facility] on  November 29, 2007, after being diagnosed with acute diarrheal illness and  hypovolemic shock, status post full resuscitation with pressor and  antibiotic and with significant improvement of her situation.  The  patient was discharged yesterday after complete resolution of her  nausea, vomiting and diarrhea.  The patient here today with the same  symptoms.  The patient after discharge home started complaining of  persistent nausea associated with vomiting which was intractable of any  eaten food.  The patient denies any bloody vomitus.  Condition  associated with lower abdominal pain.  The pain was 7/10, non-radiating.  No relieving factor.  The patient cannot recall any aggravating factor.  Her condition also associated with frequent watery diarrhea.  The  patient noted to have red diarrhea, but she denies any bloody stool.  The patient started to feel weak and lethargic with some tenesmus.  The  patient also admitted low-grade fever last night.  Denies any cough and  denies any shortness of breath.  Denies any chest pain.  The patient  admitted.  These symptoms have been there since November 20, 2007, where she  was evaluated in [facility] emergency room and was diagnosed as  pneumonia and given prescription of antibiotic.  The patient came back  on November 26, 2007, with the same symptoms; nausea, vomiting and diarrhea.  And at that time,  she had stool for C diff which was negative.  Also  having blood and urine culture which were negative.  The patient was  diagnosed as acute viral gastroenteritis and discharged home with Flagyl  to cover empirically for C diff.  No significant improvement since the  patient's discharge.   PAST MEDICAL HISTORY:  1. Arthritis.  2. Carpal tunnel syndrome.  3. Chronic back pain.  4. Hypertension.  5. History of seizure disorder, not on any medications for more than 8      years.   MEDICATIONS:  1. Multivitamin 1 tab daily.  2. Soma 350 daily.  3. Lortab 10 mg/500.  4. Flagyl 500 t.i.d., but the patient did not send the prescription      secondary to severity of her illness.  5. Prilosec OTC once daily for 2 weeks.  6. Her blood pressure medication was on hold secondary to episode of      hypertension and Colace also was on hold secondary to diarrhea.   PAST SURGICAL HISTORY:  1. Hysterectomy.  2. Fibroid removal.  3. C-section.   SOCIAL  HISTORY:  She lives in [place] with her sister.  She is  currently a smoker of cigarettes, 1/2 pack per day.  She denies alcohol  use and she denies any IV drug abuse.   FAMILY HISTORY:  Positive for diabetes and hypertension.   ALLERGIES:  1. ASPIRIN.  2. PENICILLIN.   PHYSICAL EXAMINATION:  VITAL SIGNS:  Temperature is 97.5, blood pressure  153/88, pulse rate 63, respiratory rate 24, satting 97% on room air.  GENERAL APPEARANCE:  An obese woman in mild discomfort.  Oral mucosa  dry, looks dehydrated.  HEENT:  Conjunctivae and oropharynx clear.  NECK:  Supple, no JVD.  HEART:  Regular with no murmurs.  LUNGS:  Clear to auscultation bilaterally.  ABDOMEN:  Soft.  Mild tenderness at the right iliac fossa.  Positive  bowel sounds.  No organomegaly, no rebound tenderness and no guarding.  EXTREMITIES:  Warm without edema.  NEUROLOGIC:  No focal deficits.   LABORATORY DATA:  Liver function tests; bilirubin total 0.5, alkaline   phosphatase 64, AST 28, ALT 34, total protein 6.1, albumin 2.8,  hemoglobin 10.5, hematocrit 31.  White blood cells 8.9 and platelets  381.   ASSESSMENT/PLAN:  1. Recurrent nausea, vomiting, diarrhea and abdominal pain.  Rule out      colitis versus viral gastroenteritis.  The patient already has      random cultures checked on the date of admission last time and was      within normal.  We will admit the patient to telemetry, start the      patient on IV fluid with potassium supplement.  Continue with clear      liquid diet.  Resume Cipro and Flagyl.  Get Clostridium difficile      toxin in the stool, ova and parasite and white blood cell.  Will      get CT abdomen and pelvis with contrast.  Will consult      gastroenterology to evaluate the causes of continuous symptoms.  2. Hypokalemia, replacement.  3. Anemia.  Hemoglobin has remained stable around 10.5 from previous      admission.  The patient denies any gastrointestinal bleeding, but      in any circumstances will get stool for stool guaiac and lactic      acid level.  4. Hypertension.  Will hold blood pressure medications.  Deep venous      thrombosis and gastrointestinal prophylaxis.  Further      recommendations to be addressed as hospital course progresses.      Hind Bosie Helper, MD  Electronically Signed     HIE/MEDQ  D:  11/30/2007  T:  11/30/2007  Job:  [id number]

## 2010-10-07 NOTE — Consult Note (Signed)
[name], [name]               ACCOUNT NO.:  [id number]   MEDICAL RECORD NO.:  [id number]          PATIENT TYPE:  INP   LOCATION:  1445                         FACILITY[facility]   PHYSICIAN:  Shirley Friar, MDDATE OF BIRTH:  25-Feb-1966   DATE OF CONSULTATION:  12/01/2007  DATE OF DISCHARGE:                                 CONSULTATION   REASON FOR CONSULTATION:  Diarrhea, abdominal pain.   HISTORY OF PRESENT ILLNESS:  [name] is a 45 year old black female  who was discharged on November 29, 2007 after a short hospitalization for  acute diarrheal illness associated with hypovolemic shock requiring  pressors and supportive care.  Workup for diarrhea at that time was not  conclusive and, after resolution of her symptoms, she was discharged on  November 29, 2007.  She was readmitted on November 30, 2007 due to intractable  vomiting, continued watery diarrhea, and abdominal pain.  During her  previous hospitalization, she had a negative C. diff toxin x1 and  negative ova and parasite study.  CAT scan done during this time showed  a normal appendix and terminal ileum but did show free fluid within the  pelvis without any identified pelvic mass or adenopathy, There was some  mild indistinctness of the pancreatic head.   She reports that when she has the diarrhea, her abdominal pain does get  better and she reports having 3 watery stools per day.  Prior to several  weeks ago, she says she was having normal formed stools daily.   PAST MEDICAL HISTORY:  Hypertension, history of seizures, chronic back  pain, carpal tunnel syndrome, arthritis.   CURRENT MEDICATIONS:  Cipro, Flagyl, Lovenox, Protonix, K-Dur.   ALLERGIES:  ASPIRIN, PENICILLIN.   FAMILY HISTORY:  Noncontributory.   SOCIAL HISTORY:  Positive tobacco, denies alcohol.   REVIEW OF SYSTEMS:  Negative except as stated above.   PHYSICAL EXAM:  VITAL SIGNS:  Temperature 98.8, pulse 59, blood pressure  137/70, O2 sat is 98% on room  air.  GENERAL:  Alert.  Mild acute distress.  ABDOMEN:  Right lower quadrant tenderness with guarding, otherwise  nontender, soft, no rebound, nondistended, positive bowel sounds.   LABS:  White blood count 8.5, hemoglobin 9.8, platelet count 341,000,  INR 1.3, potassium 2.8, BUN 8, creatinine 0.88, lipase 15, amylase 49.   IMPRESSION:  A 45 year old black female with recent onset of watery  diarrhea, abdominal pain, nausea and vomiting, question inflammatory  bowel disease such as Crohn's disease versus gastroenteritis.  Will plan  to do a  colonoscopy and possible upper endoscopy on December 02, 2007 after MiraLax  prep.  If these tests are unremarkable for source of her symptoms, the  patient may need to have a small-bowel follow-through her CT  enterography to further clarify her small intestinal tract.  I discussed  risks and benefits of the procedure with her and she agrees to proceed.      Shirley Friar, MD  Electronically Signed     VCS/MEDQ  D:  12/01/2007  T:  12/01/2007  Job:  [id number]

## 2011-02-19 LAB — COMPREHENSIVE METABOLIC PANEL
ALT: 263 — ABNORMAL HIGH
Albumin: 2.2 — ABNORMAL LOW
Alkaline Phosphatase: 52
BUN: 8
CO2: 29
CO2: 25
Chloride: 98
Chloride: 111
Creatinine, Ser: 0.77
GFR calc Af Amer: >60
GFR calc non Af Amer: >60
Glucose, Bld: 132 — ABNORMAL HIGH
Glucose, Bld: 84
Potassium: 2.4 — CL
Potassium: 2.8 — ABNORMAL LOW
Total Bilirubin: 0.8

## 2011-02-19 LAB — CBC
HCT: 37
HCT: 28 — ABNORMAL LOW
HCT: 28.8 — ABNORMAL LOW
HCT: 29.5 — ABNORMAL LOW
HCT: 37.7
Hemoglobin: 12.6
Hemoglobin: 10.5 — ABNORMAL LOW
Hemoglobin: 10.7 — ABNORMAL LOW
Hemoglobin: 12.7
Hemoglobin: 9.2 — ABNORMAL LOW
Hemoglobin: 9.8 — ABNORMAL LOW
MCHC: 34
MCHC: 33
MCHC: 33.3
MCHC: 33.3
MCHC: 33.8
MCHC: 34
MCHC: 34
MCHC: 34.2
MCV: 86.2
MCV: 86.5
MCV: 87.1
MCV: 87.5
Platelets: 312
Platelets: 306
Platelets: 310
Platelets: 341
Platelets: 381
RBC: 4.32
RBC: 3.19 — ABNORMAL LOW
RBC: 3.33 — ABNORMAL LOW
RBC: 3.55 — ABNORMAL LOW
RBC: 4.36
RDW: 14
RDW: 12.9
RDW: 13.7
RDW: 14
RDW: 14.1
WBC: 8.1
WBC: 8.5

## 2011-02-19 LAB — BLOOD GAS, ARTERIAL
Acid-base deficit: 0.7
Bicarbonate: 22
TCO2: 20.1
pCO2 arterial: 31.2 — ABNORMAL LOW
pO2, Arterial: 192 — ABNORMAL HIGH

## 2011-02-19 LAB — CLOSTRIDIUM DIFFICILE EIA
C difficile Toxins A+B, EIA: NEGATIVE
C difficile Toxins A+B, EIA: NEGATIVE

## 2011-02-19 LAB — DIFFERENTIAL
Basophils Absolute: 0
Basophils Absolute: 0
Basophils Absolute: 0.1
Basophils Relative: 0
Basophils Relative: 0
Basophils Relative: 1
Eosinophils Relative: 1
Lymphocytes Relative: 5 — ABNORMAL LOW
Lymphocytes Relative: 22
Lymphs Abs: 0.4 — ABNORMAL LOW
Monocytes Absolute: 0.3
Monocytes Absolute: 0.1
Monocytes Absolute: 0.4
Monocytes Absolute: 0.6
Monocytes Relative: 3
Monocytes Relative: 2 — ABNORMAL LOW
Neutro Abs: 7.1
Neutro Abs: 4.8
Neutro Abs: 5.9
Neutro Abs: 7.6
Neutrophils Relative %: 67

## 2011-02-19 LAB — CULTURE, BLOOD (ROUTINE X 2)
Culture: NO GROWTH
Culture: NO GROWTH
Culture: NO GROWTH
Culture: NO GROWTH

## 2011-02-19 LAB — URINALYSIS, ROUTINE W REFLEX MICROSCOPIC
Bilirubin Urine: NEGATIVE
Hgb urine dipstick: NEGATIVE
Hgb urine dipstick: NEGATIVE
Nitrite: NEGATIVE
Nitrite: NEGATIVE
Protein, ur: NEGATIVE
Specific Gravity, Urine: 1.017
Urobilinogen, UA: 1
Urobilinogen, UA: 0.2
Urobilinogen, UA: 1
pH: 7

## 2011-02-19 LAB — BASIC METABOLIC PANEL
BUN: 13
BUN: 23
BUN: 4 — ABNORMAL LOW
CO2: 19
CO2: 21
CO2: 21
CO2: 24
CO2: 25
CO2: 28
Calcium: 7.9 — ABNORMAL LOW
Calcium: 8.2 — ABNORMAL LOW
Chloride: 109
Chloride: 110
Chloride: 116 — ABNORMAL HIGH
Creatinine, Ser: 0.89
Creatinine, Ser: 0.94
Creatinine, Ser: 0.99
Creatinine, Ser: 1.01
Creatinine, Ser: 1.77 — ABNORMAL HIGH
GFR calc Af Amer: >60
GFR calc Af Amer: >60
GFR calc Af Amer: >60
GFR calc non Af Amer: 50 — ABNORMAL LOW
GFR calc non Af Amer: >60
GFR calc non Af Amer: >60
Glucose, Bld: 108 — ABNORMAL HIGH
Glucose, Bld: 123 — ABNORMAL HIGH
Glucose, Bld: 133 — ABNORMAL HIGH
Glucose, Bld: 90
Glucose, Bld: 99
Potassium: 3.6
Potassium: 3.8
Sodium: 140
Sodium: 140
Sodium: 140

## 2011-02-19 LAB — HEPATIC FUNCTION PANEL
ALT: 34
ALT: 47 — ABNORMAL HIGH
AST: 20
AST: 28
Albumin: 2.7 — ABNORMAL LOW
Albumin: 2.8 — ABNORMAL LOW
Alkaline Phosphatase: 83
Bilirubin, Direct: 0.1
Indirect Bilirubin: 0.7
Total Protein: 5 — ABNORMAL LOW
Total Protein: 6.1
Total Protein: 6.4

## 2011-02-19 LAB — RETICULOCYTES
RBC.: 3.67 — ABNORMAL LOW
Retic Count, Absolute: 58.7
Retic Ct Pct: 1.6

## 2011-02-19 LAB — OVA AND PARASITE EXAMINATION: Ova and parasites: NONE SEEN

## 2011-02-19 LAB — CARBOXYHEMOGLOBIN
Carboxyhemoglobin: 1.2
Carboxyhemoglobin: 1.3
Methemoglobin: 1.4
O2 Saturation: 67.9
Total hemoglobin: 9.6 — ABNORMAL LOW
Total hemoglobin: 9.6 — ABNORMAL LOW

## 2011-02-19 LAB — CARDIAC PANEL(CRET KIN+CKTOT+MB+TROPI)
CK, MB: 0.5
Troponin I: 0.01

## 2011-02-19 LAB — POCT I-STAT, CHEM 8
BUN: 13
Calcium, Ion: 1.04 — ABNORMAL LOW
Calcium, Ion: 1.15
Chloride: 101
Chloride: 110
Glucose, Bld: 103 — ABNORMAL HIGH
Glucose, Bld: 94
HCT: 31 — ABNORMAL LOW
HCT: 38
Potassium: 3.1 — ABNORMAL LOW
TCO2: 26

## 2011-02-19 LAB — URINE CULTURE
Colony Count: NO GROWTH
Culture: NO GROWTH
Special Requests: NEGATIVE
Special Requests: NEGATIVE

## 2011-02-19 LAB — DRUGS OF ABUSE SCREEN W/O ALC, ROUTINE URINE
Benzodiazepines.: NEGATIVE
Cocaine Metabolites: NEGATIVE
Creatinine,U: 73.4
Methadone: NEGATIVE
Opiate Screen, Urine: NEGATIVE
Propoxyphene: NEGATIVE

## 2011-02-19 LAB — PROTIME-INR
Prothrombin Time: 16 — ABNORMAL HIGH
Prothrombin Time: 16.1 — ABNORMAL HIGH

## 2011-02-19 LAB — PHOSPHORUS
Phosphorus: 2.1 — ABNORMAL LOW
Phosphorus: 2.3

## 2011-02-19 LAB — CORTISOL: Cortisol, Plasma: 17.1

## 2011-02-19 LAB — HEPATITIS PANEL, ACUTE: Hep A IgM: NEGATIVE

## 2011-02-19 LAB — LIPASE, BLOOD
Lipase: 15
Lipase: 17

## 2011-02-19 LAB — LIPID PANEL
LDL Cholesterol: 89
Triglycerides: 90
VLDL: 18

## 2011-02-19 LAB — MAGNESIUM: Magnesium: 2

## 2011-02-19 LAB — FERRITIN: Ferritin: 114 (ref 10–291)

## 2011-02-19 LAB — FOLATE: Folate: 13.9

## 2011-02-19 LAB — POCT CARDIAC MARKERS
CKMB, poc: <1 — ABNORMAL LOW
Troponin i, poc: <0.05

## 2011-02-19 LAB — LACTIC ACID, PLASMA: Lactic Acid, Venous: 1.4

## 2011-02-19 LAB — UREA NITROGEN, URINE: Urea Nitrogen, Ur: 692

## 2011-02-19 LAB — TSH: TSH: 1.119 (ref 0.350–4.500)

## 2011-09-11 ENCOUNTER — Other Ambulatory Visit: Payer: Self-pay | Admitting: Internal Medicine

## 2011-09-11 DIAGNOSIS — Z1231 Encounter for screening mammogram for malignant neoplasm of breast: Secondary | ICD-10-CM

## 2011-10-05 ENCOUNTER — Ambulatory Visit
Admission: RE | Admit: 2011-10-05 | Discharge: 2011-10-05 | Disposition: A | Discharge status: Home / Self Care | Payer: Medicare Other | Source: Ambulatory Visit | Attending: Internal Medicine | Admitting: Internal Medicine

## 2011-10-05 DIAGNOSIS — Z1231 Encounter for screening mammogram for malignant neoplasm of breast: Secondary | ICD-10-CM

## 2012-07-26 ENCOUNTER — Inpatient Hospital Stay (HOSPITAL_COMMUNITY)
Admission: EM | Admit: 2012-07-26 | Discharge: 2012-07-27 | DRG: 101 | Disposition: A | Discharge status: Home Health | Payer: Medicare Other | Attending: Internal Medicine | Admitting: Internal Medicine

## 2012-07-26 ENCOUNTER — Observation Stay (HOSPITAL_COMMUNITY): Payer: Medicare Other

## 2012-07-26 ENCOUNTER — Inpatient Hospital Stay (HOSPITAL_COMMUNITY): Payer: Medicare Other

## 2012-07-26 ENCOUNTER — Emergency Department (HOSPITAL_COMMUNITY): Payer: Medicare Other

## 2012-07-26 ENCOUNTER — Encounter (HOSPITAL_COMMUNITY): Payer: Self-pay

## 2012-07-26 DIAGNOSIS — F17200 Nicotine dependence, unspecified, uncomplicated: Secondary | ICD-10-CM | POA: Diagnosis present

## 2012-07-26 DIAGNOSIS — M129 Arthropathy, unspecified: Secondary | ICD-10-CM | POA: Diagnosis present

## 2012-07-26 DIAGNOSIS — G40802 Other epilepsy, not intractable, without status epilepticus: Principal | ICD-10-CM | POA: Diagnosis present

## 2012-07-26 DIAGNOSIS — Z91199 Patient's noncompliance with other medical treatment and regimen due to unspecified reason: Secondary | ICD-10-CM

## 2012-07-26 DIAGNOSIS — M1990 Unspecified osteoarthritis, unspecified site: Secondary | ICD-10-CM | POA: Diagnosis present

## 2012-07-26 DIAGNOSIS — Z79899 Other long term (current) drug therapy: Secondary | ICD-10-CM

## 2012-07-26 DIAGNOSIS — Z880 Allergy status to penicillin: Secondary | ICD-10-CM

## 2012-07-26 DIAGNOSIS — Z8249 Family history of ischemic heart disease and other diseases of the circulatory system: Secondary | ICD-10-CM

## 2012-07-26 DIAGNOSIS — D649 Anemia, unspecified: Secondary | ICD-10-CM | POA: Diagnosis present

## 2012-07-26 DIAGNOSIS — R569 Unspecified convulsions: Secondary | ICD-10-CM

## 2012-07-26 DIAGNOSIS — I10 Essential (primary) hypertension: Secondary | ICD-10-CM | POA: Diagnosis present

## 2012-07-26 DIAGNOSIS — Z720 Tobacco use: Secondary | ICD-10-CM | POA: Diagnosis present

## 2012-07-26 DIAGNOSIS — Z9119 Patient's noncompliance with other medical treatment and regimen: Secondary | ICD-10-CM

## 2012-07-26 DIAGNOSIS — R531 Weakness: Secondary | ICD-10-CM

## 2012-07-26 DIAGNOSIS — Z833 Family history of diabetes mellitus: Secondary | ICD-10-CM

## 2012-07-26 DIAGNOSIS — Z886 Allergy status to analgesic agent status: Secondary | ICD-10-CM

## 2012-07-26 HISTORY — DX: Unspecified convulsions: R56.9

## 2012-07-26 HISTORY — DX: Essential (primary) hypertension: I10

## 2012-07-26 HISTORY — DX: Nicotine dependence, unspecified, uncomplicated: F17.200

## 2012-07-26 HISTORY — DX: Unspecified osteoarthritis, unspecified site: M19.90

## 2012-07-26 HISTORY — DX: Other chronic pain: G89.29

## 2012-07-26 HISTORY — DX: Benign neoplasm of connective and other soft tissue, unspecified: D21.9

## 2012-07-26 HISTORY — DX: Carpal tunnel syndrome, unspecified upper limb: G56.00

## 2012-07-26 HISTORY — DX: Dorsalgia, unspecified: M54.9

## 2012-07-26 LAB — PHOSPHORUS: Phosphorus: 3.2 mg/dL (ref 2.3–4.6)

## 2012-07-26 LAB — GLUCOSE, CAPILLARY: Glucose-Capillary: 116 mg/dL — ABNORMAL HIGH (ref 70–99)

## 2012-07-26 LAB — PROTIME-INR
INR: 0.92 (ref 0.00–1.49)
Prothrombin Time: 12.3 seconds (ref 11.6–15.2)

## 2012-07-26 LAB — URINALYSIS, ROUTINE W REFLEX MICROSCOPIC
Glucose, UA: NEGATIVE mg/dL
Hgb urine dipstick: NEGATIVE
Leukocytes, UA: NEGATIVE
Protein, ur: NEGATIVE mg/dL
Specific Gravity, Urine: 1.022 (ref 1.005–1.030)
Urobilinogen, UA: 0.2 mg/dL (ref 0.0–1.0)

## 2012-07-26 LAB — COMPREHENSIVE METABOLIC PANEL
Alkaline Phosphatase: 86 U/L (ref 39–117)
BUN: 16 mg/dL (ref 6–23)
CO2: 28 mEq/L (ref 19–32)
Chloride: 109 mEq/L (ref 96–112)
Creatinine, Ser: 0.83 mg/dL (ref 0.50–1.10)
GFR calc non Af Amer: 83 mL/min — ABNORMAL LOW (ref >90)
Potassium: 4.1 mEq/L (ref 3.5–5.1)
Total Bilirubin: 0.1 mg/dL — ABNORMAL LOW (ref 0.3–1.2)

## 2012-07-26 LAB — RAPID URINE DRUG SCREEN, HOSP PERFORMED
Amphetamines: NOT DETECTED
Barbiturates: NOT DETECTED
Barbiturates: NOT DETECTED
Cocaine: NOT DETECTED
Cocaine: NOT DETECTED
Opiates: POSITIVE — AB
Tetrahydrocannabinol: NOT DETECTED
Tetrahydrocannabinol: NOT DETECTED

## 2012-07-26 LAB — CBC WITH DIFFERENTIAL/PLATELET
HCT: 35.6 % — ABNORMAL LOW (ref 36.0–46.0)
Hemoglobin: 11.8 g/dL — ABNORMAL LOW (ref 12.0–15.0)
Lymphocytes Relative: 42 % (ref 12–46)
Lymphs Abs: 3.2 10*3/uL (ref 0.7–4.0)
Monocytes Absolute: 0.3 10*3/uL (ref 0.1–1.0)
Monocytes Relative: 4 % (ref 3–12)
Neutro Abs: 3.9 10*3/uL (ref 1.7–7.7)
Neutrophils Relative %: 52 % (ref 43–77)
RBC: 4.14 MIL/uL (ref 3.87–5.11)
WBC: 7.5 10*3/uL (ref 4.0–10.5)

## 2012-07-26 LAB — CBC
Hemoglobin: 11.4 g/dL — ABNORMAL LOW (ref 12.0–15.0)
MCHC: 32.5 g/dL (ref 30.0–36.0)
Platelets: 309 10*3/uL (ref 150–400)

## 2012-07-26 LAB — CREATININE, SERUM
Creatinine, Ser: 0.77 mg/dL (ref 0.50–1.10)
GFR calc non Af Amer: >90 mL/min (ref >90)

## 2012-07-26 LAB — VITAMIN B12: Vitamin B-12: 234 pg/mL (ref 211–911)

## 2012-07-26 LAB — FERRITIN: Ferritin: 76 ng/mL (ref 10–291)

## 2012-07-26 LAB — IRON AND TIBC
Iron: 42 ug/dL (ref 42–135)
Saturation Ratios: 16 % — ABNORMAL LOW (ref 20–55)
TIBC: 270 ug/dL (ref 250–470)

## 2012-07-26 LAB — RETICULOCYTES: Retic Ct Pct: 1.1 % (ref 0.4–3.1)

## 2012-07-26 MED ORDER — IRBESARTAN 300 MG PO TABS
300.0000 mg | ORAL_TABLET | Freq: Every day | ORAL | Status: DC
  Administered 2012-07-26 – 2012-07-27 (×2): 300 mg via ORAL
  Filled 2012-07-26 (×3): qty 1

## 2012-07-26 MED ORDER — HYDROCHLOROTHIAZIDE 12.5 MG PO CAPS
12.5000 mg | ORAL_CAPSULE | Freq: Every day | ORAL | Status: DC
  Administered 2012-07-26 – 2012-07-27 (×2): 12.5 mg via ORAL
  Filled 2012-07-26 (×3): qty 1

## 2012-07-26 MED ORDER — ENOXAPARIN SODIUM 40 MG/0.4ML ~~LOC~~ SOLN
40.0000 mg | SUBCUTANEOUS | Status: DC
  Administered 2012-07-26 – 2012-07-27 (×2): 40 mg via SUBCUTANEOUS
  Filled 2012-07-26 (×2): qty 0.4

## 2012-07-26 MED ORDER — ONDANSETRON HCL 4 MG PO TABS: 4.0000 mg | ORAL_TABLET | Freq: Four times a day (QID) | ORAL | Status: DC | PRN

## 2012-07-26 MED ORDER — HYDROCODONE-ACETAMINOPHEN 10-325 MG PO TABS
1.0000 | ORAL_TABLET | Freq: Four times a day (QID) | ORAL | Status: DC | PRN
  Administered 2012-07-26 – 2012-07-27 (×3): 1 via ORAL
  Filled 2012-07-26 (×3): qty 1

## 2012-07-26 MED ORDER — SODIUM CHLORIDE 0.9 % IV SOLN
1000.0000 mg | Freq: Once | INTRAVENOUS | Status: DC
  Filled 2012-07-26: qty 20

## 2012-07-26 MED ORDER — ACETAMINOPHEN 650 MG RE SUPP: 650.0000 mg | Freq: Four times a day (QID) | RECTAL | Status: DC | PRN

## 2012-07-26 MED ORDER — LABETALOL HCL 5 MG/ML IV SOLN: 5.0000 mg | Freq: Once | INTRAVENOUS | Status: DC

## 2012-07-26 MED ORDER — ONDANSETRON HCL 4 MG/2ML IJ SOLN: 4.0000 mg | Freq: Four times a day (QID) | INTRAMUSCULAR | Status: DC | PRN

## 2012-07-26 MED ORDER — CARISOPRODOL 350 MG PO TABS
350.0000 mg | ORAL_TABLET | Freq: Three times a day (TID) | ORAL | Status: DC | PRN
  Administered 2012-07-27: 350 mg via ORAL
  Filled 2012-07-26: qty 1

## 2012-07-26 MED ORDER — LABETALOL HCL 5 MG/ML IV SOLN
10.0000 mg | Freq: Once | INTRAVENOUS | Status: AC
  Administered 2012-07-26: 10 mg via INTRAVENOUS
  Filled 2012-07-26: qty 4

## 2012-07-26 MED ORDER — ACETAMINOPHEN 325 MG PO TABS: 650.0000 mg | ORAL_TABLET | Freq: Four times a day (QID) | ORAL | Status: DC | PRN

## 2012-07-26 MED ORDER — GADOBENATE DIMEGLUMINE 529 MG/ML IV SOLN
20.0000 mL | Freq: Once | INTRAVENOUS | Status: AC
  Administered 2012-07-26: 20 mL via INTRAVENOUS

## 2012-07-26 MED ORDER — LABETALOL HCL 5 MG/ML IV SOLN
10.0000 mg | INTRAVENOUS | Status: DC | PRN
  Filled 2012-07-26: qty 4

## 2012-07-26 MED ORDER — SODIUM CHLORIDE 0.9 % IV SOLN: INTRAVENOUS | Status: DC

## 2012-07-26 MED ORDER — VALSARTAN-HYDROCHLOROTHIAZIDE 320-12.5 MG PO TABS: 1.0000 | ORAL_TABLET | Freq: Every day | ORAL | Status: DC

## 2012-07-26 MED ORDER — SODIUM CHLORIDE 0.9 % IJ SOLN
3.0000 mL | Freq: Two times a day (BID) | INTRAMUSCULAR | Status: DC
  Administered 2012-07-26 – 2012-07-27 (×3): 3 mL via INTRAVENOUS

## 2012-07-26 NOTE — ED Notes (Signed)
Patient back from CT. Neurology at bedside.

## 2012-07-26 NOTE — Consult Note (Addendum)
Reason for Consult:Seizure Referring Physician: Bonk  CC: Seizure  HPI: [name] is an 47 y.o. 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  Over the years her seizures became less frequent and about 13 years ago she was taken off of the Dilantin that she had been maintained on.  Had her last seizure about 5 years ago but otherwise has done well off medications.  Was placed on tegretol at that time but this was discontinued as well due to non-recurrence of seizures.  She reports that she was up late overnight folding clothes.  She began to feel dizzy and then felt her legs go numb.  She yelled for her son and that is the last thing that she remembers.  Her son reports that when he arrived at her mother's side she was about to fall to the ground.  He caught her and lowered her to the bed.  She was extended and shaking.  EMS was called and the patient was brought in for evaluation. Patient reports that this seizure was different than her seizures in the past.  She does not remember having the dizziness or numbness prior to her previous seizures and does not recall having right sided symptoms after with her previous seizures.  EEG in 2009 was normal.   Past Medical History  Diagnosis Date  . Hypertension   . Seizures   . Arthritis   . Chronic back pain   . Carpal tunnel syndrome   . Single stillborn delivery outcome   . Fibroids     Past Surgical History  Procedure Laterality Date  . Tubal ligation    . Abdominal hysterectomy      Family history:  No family history of seizures.  2 children alive and well.    Social History:  reports that she has been smoking Cigarettes.  She has been smoking about 0.50 packs per day. She has never used smokeless tobacco. She reports that she does not drink alcohol or use illicit drugs. Patient is on disability.  Allergies  Allergen Reactions  . Aspirin   . Penicillins     Medications: I have  reviewed the patient's current medications. Prior to Admission:  Diovan, Soma, Hydrocodone  ROS: History obtained from the patient  General ROS: negative for - chills, fatigue, fever, night sweats, weight gain or weight loss Psychological ROS: negative for - behavioral disorder, hallucinations, memory difficulties, mood swings or suicidal ideation Ophthalmic ROS: negative for - blurry vision, double vision, eye pain or loss of vision ENT ROS: negative for - epistaxis, nasal discharge, oral lesions, sore throat, tinnitus or vertigo Allergy and Immunology ROS: negative for - hives or itchy/watery eyes Hematological and Lymphatic ROS: negative for - bleeding problems, bruising or swollen lymph nodes Endocrine ROS: negative for - galactorrhea, hair pattern changes, polydipsia/polyuria or temperature intolerance Respiratory ROS: negative for - cough, hemoptysis, shortness of breath or wheezing Cardiovascular ROS: negative for - chest pain, dyspnea on exertion, edema or irregular heartbeat Gastrointestinal ROS: negative for - abdominal pain, diarrhea, hematemesis, nausea/vomiting or stool incontinence Genito-Urinary ROS: negative for - dysuria, hematuria, incontinence or urinary frequency/urgency Musculoskeletal ROS: negative for - joint swelling or muscular weakness Neurological ROS: as noted in HPI Dermatological ROS: negative for rash and skin lesion changes  Physical Examination: Blood pressure 188/107, pulse 80, temperature 98.6 F (37 C), temperature source Oral, resp. rate 18, SpO2 100.00%.  Neurologic Examination Mental Status: Alert, oriented,  thought content appropriate.  Speech fluent without evidence of aphasia but slurred.  Speech improved as the evaluation continued.  Able to follow 3 step commands without difficulty. Cranial Nerves: II: Discs flat bilaterally; Visual fields grossly normal, pupils equal, round, reactive to light and accommodation III,IV, VI: ptosis not present,  extra-ocular motions intact bilaterally V,VII: smile symmetric, facial light touch sensation normal bilaterally VIII: hearing normal bilaterally IX,X: gag reflex present XI: bilateral shoulder shrug XII: midline tongue extension Motor: Right : Upper extremity   5-/5    Left:     Upper extremity   5/5  Lower extremity   4/5     Lower extremity   5/5 Tone and bulk:normal tone throughout; no atrophy noted Sensory: Pinprick and light touch decreased on the right and splitting the midline across the trunk Deep Tendon Reflexes: 2+ and symmetric throughout Plantars: Right: downgoing   Left: downgoing Cerebellar: normal finger-to-nose and normal heel-to-shin test Gait: Unable to test CV: pulses palpable throughout   Laboratory Studies:   Basic Metabolic Panel: No results found for this basename: NA, K, CL, CO2, GLUCOSE, BUN, CREATININE, CALCIUM, MG, PHOS,  in the last 168 hours  Liver Function Tests: No results found for this basename: AST, ALT, ALKPHOS, BILITOT, PROT, ALBUMIN,  in the last 168 hours No results found for this basename: LIPASE, AMYLASE,  in the last 168 hours No results found for this basename: AMMONIA,  in the last 168 hours  CBC: No results found for this basename: WBC, NEUTROABS, HGB, HCT, MCV, PLT,  in the last 168 hours  Cardiac Enzymes: No results found for this basename: CKTOTAL, CKMB, CKMBINDEX, TROPONINI,  in the last 168 hours  BNP: No components found with this basename: POCBNP,   CBG:  Recent Labs Lab 07/26/12 0318  GLUCAP 116*    Microbiology: Results for orders placed during the hospital encounter of 11/30/07  CULTURE, BLOOD (ROUTINE X 2)     Status: None   Collection Time    11/30/07  6:00 PM      Result Value Range Status   Specimen Description BLOOD LEFT ARM   Final   Special Requests BOTTLES DRAWN AEROBIC ONLY 5CC   Final   Culture NO GROWTH 5 DAYS   Final   Report Status 12/07/2007 FINAL   Final  CULTURE, BLOOD (ROUTINE X 2)      Status: None   Collection Time    11/30/07  6:15 PM      Result Value Range Status   Specimen Description BLOOD LEFT HAND   Final   Special Requests BOTTLES DRAWN AEROBIC AND ANAEROBIC 5CC   Final   Culture NO GROWTH 5 DAYS   Final   Report Status 12/07/2007 FINAL   Final  CLOSTRIDIUM DIFFICILE EIA     Status: None   Collection Time    11/30/07  9:16 PM      Result Value Range Status   Specimen Description STOOL   Final   Special Requests IMMUNE:NORM   Final   C difficile Toxins A+B, EIA NEGATIVE   Final   Report Status 12/06/2007 FINAL   Final  URINE CULTURE     Status: None   Collection Time    12/01/07  2:48 AM      Result Value Range Status   Specimen Description URINE, RANDOM   Final   Special Requests IMMUNE:NORM UT SYMPT:POS   Final   Colony Count NO GROWTH   Final   Culture NO GROWTH  Final   Report Status 12/02/2007 FINAL   Final    Coagulation Studies: No results found for this basename: LABPROT, INR,  in the last 72 hours  Urinalysis: No results found for this basename: COLORURINE, APPERANCEUR, LABSPEC, PHURINE, GLUCOSEU, HGBUR, BILIRUBINUR, KETONESUR, PROTEINUR, UROBILINOGEN, NITRITE, LEUKOCYTESUR,  in the last 168 hours  Lipid Panel:     Component Value Date/Time   CHOL  Value: 124        ATP III CLASSIFICATION:  <200     mg/dL   Desirable  956-213  mg/dL   Borderline High  >=086    mg/dL   High 09/29/8467 6295   TRIG 90 11/27/2007 0345   HDL 17* 11/27/2007 0345   CHOLHDL 7.3 11/27/2007 0345   VLDL 18 11/27/2007 0345   LDLCALC  Value: 89        Total Cholesterol/HDL:CHD Risk Coronary Heart Disease Risk Table                     Men   Women  1/2 Average Risk   3.4   3.3 11/27/2007 0345    HgbA1C:  No results found for this basename: HGBA1C    Urine Drug Screen:     Component Value Date/Time   LABOPIA NEGATIVE 12/01/2007 0247   COCAINSCRNUR NEGATIVE 12/01/2007 0247   LABBENZ NEGATIVE 12/01/2007 0247   AMPHETMU NEGATIVE 12/01/2007 0247    Alcohol Level: No results found  for this basename: ETH,  in the last 168 hours  Imaging: CT head w/o contrast - unremarkable  Assessment/Plan: 47 year old female presenting after having what appears to be a generalized tonic-clonic seizure at home.  On presentation with right sided weakness and speech difficulties that improved as her stay continued in the ED.  Per her report seizures different than they have been in the past.  CT of the head has been reviewed and is unremarkable.  No lab work is yet available.  Patient is markedly hypertensive and reports that she has not taken her BP medication the past couple of days.   With change in seizure, further work up recommended.  Would like to rule out a focal lesion.  BP elevated and a PRES syndrome is on the differential as well.  Seizure was solitary.  Frequency has been sporadic.  Would not start anticonvulsants at this time.  Possibility that seizure may have been provoked by sleep deprivation.    Recommendations: 1.  EEG 2.  MRI of the brain  3.  No anticonvulsants at this time 4.  Magnesium and Phosphorus to be added to lab work 5.  Seizure precautions 6.  Labetalol 10mg  now with maintenance antihypertensive to be restarted.   7.  Ativan prn  Case discussed with Dr. Dyann Kief, MD [facility] [phone] 07/26/2012, 4:14 AM

## 2012-07-26 NOTE — ED Notes (Signed)
Patient transported to CT 

## 2012-07-26 NOTE — Progress Notes (Signed)
Utilization Review Completed.Dowell, Deborah T3/08/2012  

## 2012-07-26 NOTE — Progress Notes (Signed)
[facility] PROGRESS NOTE  [name] GMW:[id number] DOB: February 26, 1966 DOA: 07/26/2012 PCP: No primary provider on file.  Narrative: [name] is a 47 y.o. female history of seizures last seizure was 5 years ago and is off anticonvulsants presents to the ER with complaints of seizures. Patient started feeling like dizziness was about to fall on the floor when patient's son helped her to the floor and after which patient does not remember what happened. As per patient's son patient had jerking and extending movements of the body after which patient was looking confused. Nose no associated tongue bite or incontinence of urine. After the episode in addition to the confusion patient also had some right upper and lower extremity weakness. In the ER CT head was negative. Neurologist on-call Dr. Thad Ranger have seen the patient and at this time no anticonvulsants have been started but patient has been admitted for further management including getting an MRI of the brain and EEG. Patient pressures had been elevated and her patient states that she has not taken her antihypertensives for last few days. Patient otherwise denies any headache chest pain shortness of breath nausea vomiting abdominal pain diarrhea.  Assessment/Plan: 1. Seizures - in a patient with known history of seizures. When necessary Ativan for any seizures. Patient's right-sided weakness is possibly secondary to seizures. MRI negative for acute infarct. EEG pending. Neuro following.  2. Hypertension uncontrolled secondary to noncompliance of medications - patient has been restarted on her medications and has also been placed on when necessary IV labetalol for systolic blood pressure was 160. 3. Tobacco abuse - patient advised to quit smoking. 4. Arthritis - continue home medications. 5. Normocytic normochromic anemia - check anemia panel.  Code Status: Full Family Communication: daughter bedside  Disposition Plan: TBD based on neuro  recs, suspect home 1-2 days.  Consultants:  Neurology  Procedures:  none  Antibiotics:  none  HPI/Subjective: - denies any specific complaints this morning, but appears scared and anxious  Objective: Filed Vitals:   07/26/12 0445 07/26/12 0515 07/26/12 0600 07/26/12 0759  BP: 151/84 157/89 156/92 157/91  Pulse: 79 77 79 87  Temp:    98.1 F (36.7 C)  TempSrc:    Oral  Resp: 16 19 13 20   Weight:    110.9 kg (244 lb 7.8 oz)  SpO2: 100% 99% 98% 96%   No intake or output data in the 24 hours ending 07/26/12 0921 Filed Weights   07/26/12 0759  Weight: 110.9 kg (244 lb 7.8 oz)    Exam:   General:  NAD  Cardiovascular: regular rate and rhythm, without MRG  Respiratory: good air movement, clear to auscultation throughout, no wheezing, ronchi or rales  Abdomen: soft, not tender to palpation, positive bowel sounds  MSK: no peripheral edema  Neuro: CN 2-12 grossly intact, minimal strength decrease on right  Data Reviewed: Basic Metabolic Panel:  Recent Labs Lab 07/26/12 0420  NA 145  K 4.1  CL 109  CO2 28  GLUCOSE 119*  BUN 16  CREATININE 0.83  CALCIUM 9.3  MG 1.8  PHOS 2.8   Liver Function Tests:  Recent Labs Lab 07/26/12 0420  AST 13  ALT 13  ALKPHOS 86  BILITOT 0.1*  PROT 7.1  ALBUMIN 3.2*   CBC:  Recent Labs Lab 07/26/12 0420  WBC 7.5  NEUTROABS 3.9  HGB 11.8*  HCT 35.6*  MCV 86.0  PLT 303   CBG:  Recent Labs Lab 07/26/12 0318  GLUCAP 116*   Studies:  Ct Head Wo Contrast  07/26/2012  *RADIOLOGY REPORT*  Clinical Data: Seizure.  CT HEAD WITHOUT CONTRAST  Technique:  Contiguous axial images were obtained from the base of the skull through the vertex without contrast.  Comparison: CT of the head performed 03/31/2008  Findings: There is no evidence of acute infarction, mass lesion, or intra- or extra-axial hemorrhage on CT.  The posterior fossa, including the cerebellum, brainstem and fourth ventricle, is within normal limits.   The third and lateral ventricles, and basal ganglia are unremarkable in appearance.  The cerebral hemispheres are symmetric in appearance, with normal gray- white differentiation.  No mass effect or midline shift is seen.  There is no evidence of fracture; visualized osseous structures are unremarkable in appearance.  The visualized portions of the orbits are within normal limits.  The paranasal sinuses and mastoid air cells are well-aerated.  No significant soft tissue abnormalities are seen.  IMPRESSION: Unremarkable noncontrast CT of the head.   Original Report Authenticated By: Tonia Ghent, M.D.    Mr Brain Wo Contrast  07/26/2012  *RADIOLOGY REPORT*  Clinical Data: Seizure with right-sided paralysis  MRI HEAD WITHOUT CONTRAST  Technique:  Multiplanar, multiecho pulse sequences of the brain and surrounding structures were obtained according to standard protocol without intravenous contrast.  Comparison: CT head 07/27/2010  Findings: Cerebellar tonsils are low-lying measuring approximately 5.6 mm below the foramen magnum.  Mild impaction of the tonsils is noted.  Negative for hydrocephalus.  Pituitary gland is normal in size.  Small hyperintensity in the deep white matter on the left compatible with chronic ischemia. Chronic lacuna in the left putamen.  Negative for acute infarct.  No other areas of ischemia are seen.  Negative for demyelinating disease.  Brainstem is intact.  Negative for intracranial hemorrhage or mass lesion.  No edema or midline shift.  Hippocampus is symmetric and normal in volume bilaterally.  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  No acute infarct or mass.   Original Report Authenticated By: Janeece Riggers, M.D.     Scheduled Meds: . enoxaparin (LOVENOX) injection  40 mg Subcutaneous Q24H  . hydrochlorothiazide  12.5 mg Oral Daily  . irbesartan  300 mg Oral Daily  . labetalol  5 mg Intravenous Once  . sodium chloride  3 mL  Intravenous Q12H   Continuous Infusions: . sodium chloride      Principal Problem:   Seizures Active Problems:   Other convulsions   Hypertension   Arthritis   Tobacco abuse   Anemia  Pamella Pert, MD [facility] Pager [phone]. If 7 PM - 7 AM, please contact night-coverage at [email or web address], password [facility] 07/26/2012, 9:21 AM  LOS: 0 days

## 2012-07-26 NOTE — Procedures (Signed)
EEG report.  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 Technique: this is a routine scalp EEG performed at the bedside with bipolar and monopolar montages arranged in accordance to the 10/20  system of electrode placement . One channel was dedicated to EKG recording.  The study was performed during wakefulness, drowsiness, and stage 2 sleep. No activating procedures performed during the test.  Description:In the wakeful state, the best background consisted of a medium amplitude, posterior dominant, well sustained, symmetric and reactive 10 Hz rhythm. Drowsiness demonstrated dropout of the alpha rhythm. Stage 2 sleep showed symmetric and synchronous sleep spindles. Frequent, intermittent, sharply contoured left temporal slowing noted. In addition, frequent, intermittent epileptiform discharges seen over the left anterior temporal region.  EKG showed sinus rhythm.  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 No electrographic seizures seen.  Clinical correlation is advised.  Wyatt Portela, MD

## 2012-07-26 NOTE — ED Notes (Signed)
Admitting MD at bedside evaluating patient. Afterwards, patient will be transported to MRI. Report given to floor. Patient will be transported to until upon returning from radiology

## 2012-07-26 NOTE — ED Notes (Signed)
Family at bedside. 

## 2012-07-26 NOTE — Progress Notes (Signed)
Portable adult EEG completed. 

## 2012-07-26 NOTE — ED Notes (Signed)
Patient from home. Presents via EMS for witnessed seizure. Has hx of the same. Last seizure 2009. At EMS arrival, patient unable to speak. Son states that this is normal for patient after having a seizure. En route to hospital, patient regained the ability to speak but struggles to get her words out. Also noted by EMS to have right sided weakness. Patient reported to EMS that she was ambulating to the bathroom and felt a tingling sensation in her legs. Patient hypertensive, 190/120. Last BP 185/108. Has hx of the same. Has not taken medications tonight. Currently, AAOx4.

## 2012-07-26 NOTE — ED Provider Notes (Signed)
History     CSN: [id number]  Arrival date & time 07/26/12  0309   None     Chief Complaint  Patient presents with  . Seizures   history obtained per EMS, patient-patient still appears mildly post ictal, and her son who witnessed her seizure.   HPI [name] is a 47 y.o. female with a history of seizure disorder who last had a seizure about 5 years ago. Patient was taken off seizure medications about 13 years ago, she was previously taking Dilantin. Patient was up late, denies any alcohol or illicit drugs, when she had a seizure. Son describes it as a stiffening, many acts out extensions of the arms, eyes rolling back the head, he helps his mother to the ground gently, she did not hit her head or suffer any injury or fall. Symptoms are severe, resolving, she hasn't taken the medication for them.   No past medical history on file.  No past surgical history on file.  No family history on file.  History  Substance Use Topics  . Smoking status: Current Every Day Smoker -- 0.50 packs/day    Types: Cigarettes  . Smokeless tobacco: Never Used  . Alcohol Use: No    OB History   No data available      Review of Systems At least 10pt or greater review of systems completed and are negative except where specified in the HPI.  Allergies  Review of patient's allergies indicates not on file.  Home Medications  No current outpatient prescriptions on file.  There were no vitals taken for this visit.  Physical Exam  PHYSICAL EXAM: VITAL SIGNS:  . Filed Vitals:   07/26/12 0336 07/26/12 0445 07/26/12 0515 07/26/12 0600  BP:  151/84 157/89 156/92  Pulse:  79 77 79  Temp: 98.6 F (37 C)     TempSrc:      Resp:  16 19 13   SpO2:  100% 99% 98%   CONSTITUTIONAL: Awake, oriented, appears non-toxic, mildly slurred speech HENT: Atraumatic, normocephalic, oral mucosa pink and moist, airway patent. Nares patent without drainage. External ears normal. EYES: Conjunctiva clear, EOMI,  PERRLA NECK: Trachea midline, non-tender, supple CARDIOVASCULAR: Normal heart rate, Normal rhythm, No murmurs, rubs, gallops PULMONARY/CHEST: Clear to auscultation, no rhonchi, wheezes, or rales. Symmetrical breath sounds. CHEST WALL: No lesions. Non-tender. ABDOMINAL: Non-distended, obese, soft, non-tender - no rebound or guarding.  BS normal. NEUROLOGIC: Facial sensation equal to light touch bilaterally.  Good muscle bulk in the masseter muscle and good lateral movement of the jaw.  Facial expressions equal and good strength with smile/frown and puffed cheeks.  Hearing grossly intact to finger rub test.  Uvula, tongue are midline with no deviation. Symmetrical palate elevation.  Trapezius and SCM muscles are 5/5 strength bilaterally.   DTR: Brachioradialis, biceps, patellar, Achilles tendon reflexes 2+ bilaterally.  No clonus. Strength: 5/5 strength flexors and extensors in the upper and lower extremities on the left..  Grip strength, finger adduction/abduction 5/5 in the left as well. Patient is able to intermittently move her right arm with 5 out of 5 strength, she says occasionally feels heavier - when distracted, the patient appears to have 5 out of 5 strength in the right arm.  Patient has residual weakness in the right leg, she can lift it off of the bed but becomes fatigued easily, 4/5 strength Sensation: Sensation intact distally to light touch Gait and Station: Did not walk the patient secondary to patient safety.  EXTREMITIES: No clubbing, cyanosis,  or edema SKIN: Warm, Dry, No erythema, No rash   ED Course  Procedures (including critical care time)  Labs Reviewed  GLUCOSE, CAPILLARY - Abnormal; Notable for the following:    Glucose-Capillary 116 (*)    All other components within normal limits  CBC WITH DIFFERENTIAL - Abnormal; Notable for the following:    Hemoglobin 11.8 (*)    HCT 35.6 (*)    All other components within normal limits  COMPREHENSIVE METABOLIC PANEL -  Abnormal; Notable for the following:    Glucose, Bld 119 (*)    Albumin 3.2 (*)    Total Bilirubin 0.1 (*)    GFR calc non Af Amer 83 (*)    All other components within normal limits  URINE RAPID DRUG SCREEN (HOSP PERFORMED) - Abnormal; Notable for the following:    Opiates POSITIVE (*)    All other components within normal limits  URINALYSIS, ROUTINE W REFLEX MICROSCOPIC - Abnormal; Notable for the following:    APPearance CLOUDY (*)    All other components within normal limits  ETHANOL  PROTIME-INR  APTT  MAGNESIUM  PHOSPHORUS   Ct Head Wo Contrast  07/26/2012  *RADIOLOGY REPORT*  Clinical Data: Seizure.  CT HEAD WITHOUT CONTRAST  Technique:  Contiguous axial images were obtained from the base of the skull through the vertex without contrast.  Comparison: CT of the head performed 03/31/2008  Findings: There is no evidence of acute infarction, mass lesion, or intra- or extra-axial hemorrhage on CT.  The posterior fossa, including the cerebellum, brainstem and fourth ventricle, is within normal limits.  The third and lateral ventricles, and basal ganglia are unremarkable in appearance.  The cerebral hemispheres are symmetric in appearance, with normal gray- white differentiation.  No mass effect or midline shift is seen.  There is no evidence of fracture; visualized osseous structures are unremarkable in appearance.  The visualized portions of the orbits are within normal limits.  The paranasal sinuses and mastoid air cells are well-aerated.  No significant soft tissue abnormalities are seen.  IMPRESSION: Unremarkable noncontrast CT of the head.   Original Report Authenticated By: Tonia Ghent, M.D.      1. Other convulsions   2. Seizure   3. Post-ictal state   4. Right sided weakness   5. Hypertension   6. Tobacco abuse       MDM  [name] is a 47 y.o. female presents after seizure witnessed by her son - patient has been improving since being in the emergency department,  presentation suggests postictal state versus Todd's paralysis versus possible CVA/TIA. Do not think this patient's deficits warrant TPA even that they were secondary to CVA.    CT scan of the head obtained as well as basic labs - alcohol and drug screen obtained as well.  Discussed case with Dr. Thad Ranger, she is evaluated and left a note patient should receive an MRI, EEG, no anticonvulsants at this time advised by neurology.  CT of the head is unremarkable-no acute bleeds, no obvious CVA. MRI of the brain ordered.  Discussed patient with Dr. Toniann Fail - patient stable for admission, telemetry.        Jones Skene, MD 07/26/12 [phone]

## 2012-07-26 NOTE — ED Notes (Signed)
MD at bedside. 

## 2012-07-26 NOTE — ED Notes (Signed)
Report given to Victorino Dike, Charity fundraiser. Patient still in MRI and will be transported to unit upon return.

## 2012-07-26 NOTE — H&P (Signed)
[facility] History and Physical  Hafsa Lohn ZOX:[id number] DOB: [birth year] DOA: 07/26/2012  Referring physician: Dr.Bonk. PCP: No primary provider on file. Dr. Tyson Dense. Specialists: None.  Chief Complaint: Seizures.  HPI: Alaysia Lightle is a 47 y.o. female history of seizures last seizure was 5 years ago and is off anticonvulsants presents to the ER with complaints of seizures. Patient started feeling like dizziness was about to fall on the floor when patient's son helped her to the floor and after which patient does not remember what happened. As per patient's son patient had jerking and extending movements of the body after which patient was looking confused. Nose no associated tongue bite or incontinence of urine. After the episode in addition to the confusion patient also had some right upper and lower extremity weakness. In the ER CT head was negative. Neurologist on-call Dr. Thad Ranger have seen the patient and at this time no anticonvulsants have been started but patient has been admitted for further management including getting an MRI of the brain and EEG. Patient pressures had been elevated and her patient states that she has not taken her antihypertensives for last few days. Patient otherwise denies any headache chest pain shortness of breath nausea vomiting abdominal pain diarrhea.  Review of Systems: As presented in the history of presenting illness nothing else significant.  Past Medical History  Diagnosis Date  . Hypertension   . Seizures   . Arthritis   . Chronic back pain   . Carpal tunnel syndrome   . Single stillborn delivery outcome   . Fibroids    Past Surgical History  Procedure Laterality Date  . Tubal ligation    . Abdominal hysterectomy     Social History:  reports that she has been smoking Cigarettes.  She has been smoking about 0.50 packs per day. She has never used smokeless tobacco. She reports that she does not drink alcohol or use illicit  drugs. Patient was at home. where does patient live--home, ALF, SNF? and with whom if at home? Can do ADLs. Can patient participate in ADLs?  Allergies  Allergen Reactions  . Aspirin Hives and Swelling  . Penicillins Hives and Swelling    Family History  Problem Relation Age of Onset  . Hypertension Mother   . Hypertension Father   . Diabetes Mellitus II Father   . CAD Other     Prior to Admission medications   Medication Sig Start Date End Date Taking? Authorizing Provider  carisoprodol (SOMA) 350 MG tablet Take 350 mg by mouth 3 (three) times daily as needed for muscle spasms.   Yes Historical Provider, MD  HYDROcodone-acetaminophen (NORCO) 10-325 MG per tablet Take 1 tablet by mouth every 6 (six) hours as needed for pain.   Yes Historical Provider, MD  ibuprofen (ADVIL,MOTRIN) 200 MG tablet Take 200 mg by mouth every 6 (six) hours as needed for pain.   Yes Historical Provider, MD  valsartan-hydrochlorothiazide (DIOVAN-HCT) 320-12.5 MG per tablet Take 1 tablet by mouth daily.   Yes Historical Provider, MD   Physical Exam: Filed Vitals:   07/26/12 0336 07/26/12 0445 07/26/12 0515 07/26/12 0600  BP:  151/84 157/89 156/92  Pulse:  79 77 79  Temp: 98.6 F (37 C)     TempSrc:      Resp:  16 19 13   SpO2:  100% 99% 98%     General:  Well-built and nourished.  Eyes: Anicteric no pallor.  ENT: No facial asymmetry. No discharge from the ears eyes nose  and mouth.  Neck: No mass felt. No neck rigidity.  Cardiovascular: S1-S2 heard.  Respiratory: No rhonchi no crepitations.  Abdomen: Soft nontender bowel sounds present.  Skin: No rash.  Musculoskeletal: No effusion or edema.  Psychiatric: Appears normal.  Neurologic: Patient has right upper and lower extremity weakness. The strength in the right upper and lower extremities is 3 x 5 and the left upper and lower extremities is 5 x 5. Tongue is midline no facial asymmetry.  Labs on Admission:  Basic Metabolic  Panel:  Recent Labs Lab 07/26/12 0420  NA 145  K 4.1  CL 109  CO2 28  GLUCOSE 119*  BUN 16  CREATININE 0.83  CALCIUM 9.3  MG 1.8  PHOS 2.8   Liver Function Tests:  Recent Labs Lab 07/26/12 0420  AST 13  ALT 13  ALKPHOS 86  BILITOT 0.1*  PROT 7.1  ALBUMIN 3.2*   No results found for this basename: LIPASE, AMYLASE,  in the last 168 hours No results found for this basename: AMMONIA,  in the last 168 hours CBC:  Recent Labs Lab 07/26/12 0420  WBC 7.5  NEUTROABS 3.9  HGB 11.8*  HCT 35.6*  MCV 86.0  PLT 303   Cardiac Enzymes: No results found for this basename: CKTOTAL, CKMB, CKMBINDEX, TROPONINI,  in the last 168 hours  BNP (last 3 results) No results found for this basename: PROBNP,  in the last 8760 hours CBG:  Recent Labs Lab 07/26/12 0318  GLUCAP 116*    Radiological Exams on Admission: Ct Head Wo Contrast  07/26/2012  *RADIOLOGY REPORT*  Clinical Data: Seizure.  CT HEAD WITHOUT CONTRAST  Technique:  Contiguous axial images were obtained from the base of the skull through the vertex without contrast.  Comparison: CT of the head performed 03/31/2008  Findings: There is no evidence of acute infarction, mass lesion, or intra- or extra-axial hemorrhage on CT.  The posterior fossa, including the cerebellum, brainstem and fourth ventricle, is within normal limits.  The third and lateral ventricles, and basal ganglia are unremarkable in appearance.  The cerebral hemispheres are symmetric in appearance, with normal gray- white differentiation.  No mass effect or midline shift is seen.  There is no evidence of fracture; visualized osseous structures are unremarkable in appearance.  The visualized portions of the orbits are within normal limits.  The paranasal sinuses and mastoid air cells are well-aerated.  No significant soft tissue abnormalities are seen.  IMPRESSION: Unremarkable noncontrast CT of the head.   Original Report Authenticated By: Tonia Ghent, M.D.       Assessment/Plan Principal Problem:   Seizures Active Problems:   Other convulsions   Hypertension   Arthritis   Tobacco abuse   Anemia   1. Seizures - in a patient with known history of seizures. At this time patient is going to get an MRI and EEG of the brain. Check phosphorous of admission levels. Check urine drug screen and further recommendations per neurologist. When necessary Ativan for any seizures. Patient's right-sided weakness is possibly secondary to seizures. MRI is pending to look for any focal lesions. 2. Hypertension uncontrolled secondary to noncompliance of medications - patient has been restarted on her medications and has also been placed on when necessary IV labetalol for systolic blood pressure was 160. 3. Tobacco abuse - patient advised to quit smoking. 4. Arthritis - continue home medications. 5. Normocytic normochromic anemia - check anemia panel.  Dr. Thad Ranger from neurology has consulted.   Code Status: Full  code. Family Communication: Patient's son and family at the bedside.  Disposition Plan: Admit to inpatient.   KAKRAKANDY,ARSHAD N. [facility] Pager [phone].  If 7PM-7AM, please contact night-coverage [email or web address] Password [facility] 07/26/2012, 6:47 AM

## 2012-07-27 DIAGNOSIS — R569 Unspecified convulsions: Secondary | ICD-10-CM

## 2012-07-27 DIAGNOSIS — M6281 Muscle weakness (generalized): Secondary | ICD-10-CM

## 2012-07-27 LAB — CBC
HCT: 34 % — ABNORMAL LOW (ref 36.0–46.0)
MCH: 28.3 pg (ref 26.0–34.0)
MCHC: 34.1 g/dL (ref 30.0–36.0)
MCV: 82.9 fL (ref 78.0–100.0)
Platelets: 291 10*3/uL (ref 150–400)
RDW: 13.8 % (ref 11.5–15.5)
WBC: 5.8 10*3/uL (ref 4.0–10.5)

## 2012-07-27 LAB — BASIC METABOLIC PANEL
BUN: 9 mg/dL (ref 6–23)
CO2: 25 mEq/L (ref 19–32)
Calcium: 9.3 mg/dL (ref 8.4–10.5)
Creatinine, Ser: 0.74 mg/dL (ref 0.50–1.10)
Glucose, Bld: 92 mg/dL (ref 70–99)

## 2012-07-27 MED ORDER — LEVETIRACETAM 500 MG PO TABS: 500.0000 mg | ORAL_TABLET | Freq: Two times a day (BID) | ORAL | Status: DC

## 2012-07-27 MED ORDER — LEVETIRACETAM 500 MG PO TABS: 500.0000 mg | ORAL_TABLET | Freq: Two times a day (BID) | ORAL | Status: AC

## 2012-07-27 MED ORDER — VITAMIN B-12 500 MCG PO TABS: 1000.0000 ug | ORAL_TABLET | Freq: Every day | ORAL | Status: DC

## 2012-07-27 MED ORDER — LEVETIRACETAM 500 MG PO TABS
500.0000 mg | ORAL_TABLET | Freq: Two times a day (BID) | ORAL | Status: DC
  Administered 2012-07-27: 500 mg via ORAL
  Filled 2012-07-27 (×2): qty 1

## 2012-07-27 NOTE — Discharge Summary (Addendum)
Physician Discharge Summary  Hubert Derstine MRN: [id number] DOB/AGE: 1966/03/01 47 y.o.  PCP: No primary provider on file.   Admit date: 07/26/2012 Discharge date: 07/27/2012  Discharge Diagnoses:      Seizures Active Problems:   Other convulsions   Hypertension   Arthritis   Tobacco abuse   Anemia     Medication List    TAKE these medications       carisoprodol 350 MG tablet  Commonly known as:  SOMA  Take 350 mg by mouth 3 (three) times daily as needed for muscle spasms.     HYDROcodone-acetaminophen 10-325 MG per tablet  Commonly known as:  NORCO  Take 1 tablet by mouth every 6 (six) hours as needed for pain.     ibuprofen 200 MG tablet  Commonly known as:  ADVIL,MOTRIN  Take 200 mg by mouth every 6 (six) hours as needed for pain.     levETIRAcetam 500 MG tablet  Commonly known as:  KEPPRA  Take 1 tablet (500 mg total) by mouth 2 (two) times daily.     valsartan-hydrochlorothiazide 320-12.5 MG per tablet  Commonly known as:  DIOVAN-HCT  Take 1 tablet by mouth daily.       vitamin B 12,1000 mg daily by mouth daily, sublingual    Discharge Condition: Stable Disposition:    Consults: Neurology   Significant Diagnostic Studies: Ct Head Wo Contrast  07/26/2012  *RADIOLOGY REPORT*  Clinical Data: Seizure.  CT HEAD WITHOUT CONTRAST  Technique:  Contiguous axial images were obtained from the base of the skull through the vertex without contrast.  Comparison: CT of the head performed 03/31/2008  Findings: There is no evidence of acute infarction, mass lesion, or intra- or extra-axial hemorrhage on CT.  The posterior fossa, including the cerebellum, brainstem and fourth ventricle, is within normal limits.  The third and lateral ventricles, and basal ganglia are unremarkable in appearance.  The cerebral hemispheres are symmetric in appearance, with normal gray- white differentiation.  No mass effect or midline shift is seen.  There is no evidence of fracture;  visualized osseous structures are unremarkable in appearance.  The visualized portions of the orbits are within normal limits.  The paranasal sinuses and mastoid air cells are well-aerated.  No significant soft tissue abnormalities are seen.  IMPRESSION: Unremarkable noncontrast CT of the head.   Original Report Authenticated By: Tonia Ghent, M.D.    Mr Brain Wo Contrast  07/26/2012  *RADIOLOGY REPORT*  Clinical Data: Seizure with right-sided paralysis  MRI HEAD WITHOUT CONTRAST  Technique:  Multiplanar, multiecho pulse sequences of the brain and surrounding structures were obtained according to standard protocol without intravenous contrast.  Comparison: CT head 07/27/2010  Findings: Cerebellar tonsils are low-lying measuring approximately 5.6 mm below the foramen magnum.  Mild impaction of the tonsils is noted.  Negative for hydrocephalus.  Pituitary gland is normal in size.  Small hyperintensity in the deep white matter on the left compatible with chronic ischemia. Chronic lacuna in the left putamen.  Negative for acute infarct.  No other areas of ischemia are seen.  Negative for demyelinating disease.  Brainstem is intact.  Negative for intracranial hemorrhage or mass lesion.  No edema or midline shift.  Hippocampus is symmetric and normal in volume bilaterally.  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  No acute infarct or mass.   Original Report Authenticated By: Janeece Riggers, M.D.    Mr Brain  W Contrast  07/27/2012  *RADIOLOGY REPORT*  Clinical Data: History of seizures from head trauma as child. Recent seizure.  High blood pressure.  MRI HEAD WITH CONTRAST  Technique:  Multiplanar, multiecho pulse sequences of the brain and surrounding structures were obtained according to standard protocol with intravenous contrast  Contrast: 20mL MULTIHANCE GADOBENATE DIMEGLUMINE 529 MG/ML IV SOLN  Comparison: Unenhanced MRI the brain 07/26/2012.  Head CT  07/26/2012.  Findings: No intracranial mass or abnormal enhancement.  Cerebellar tonsils slightly low-lying.  Sella partially empty. This may be normal although also described in patients with pseudotumor cerebri.  Remainder of findings as described on noncontrast enhanced MR performed same date.  IMPRESSION: No intracranial mass or abnormal enhancement.  Please see above.   Original Report Authenticated By: Lacy Duverney, M.D.       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 No electrographic seizures seen.   Microbiology: No results found for this or any previous visit (from the past 240 hour(s)).   Labs: Results for orders placed during the hospital encounter of 07/26/12 (from the past 48 hour(s))  GLUCOSE, CAPILLARY     Status: Abnormal   Collection Time    07/26/12  3:18 AM      Result Value Range   Glucose-Capillary 116 (*) 70 - 99 mg/dL  URINALYSIS, ROUTINE W REFLEX MICROSCOPIC     Status: Abnormal   Collection Time    07/26/12  4:15 AM      Result Value Range   Color, Urine YELLOW  YELLOW   APPearance CLOUDY (*) CLEAR   Specific Gravity, Urine 1.022  1.005 - 1.030   pH 7.0  5.0 - 8.0   Glucose, UA NEGATIVE  NEGATIVE mg/dL   Hgb urine dipstick NEGATIVE  NEGATIVE   Bilirubin Urine NEGATIVE  NEGATIVE   Ketones, ur NEGATIVE  NEGATIVE mg/dL   Protein, ur NEGATIVE  NEGATIVE mg/dL   Urobilinogen, UA 0.2  0.0 - 1.0 mg/dL   Nitrite NEGATIVE  NEGATIVE   Leukocytes, UA NEGATIVE  NEGATIVE   Comment: MICROSCOPIC NOT DONE ON URINES WITH NEGATIVE PROTEIN, BLOOD, LEUKOCYTES, NITRITE, OR GLUCOSE <1000 mg/dL.  URINE RAPID DRUG SCREEN (HOSP PERFORMED)     Status: Abnormal   Collection Time    07/26/12  4:16 AM      Result Value Range   Opiates POSITIVE (*) NONE DETECTED   Cocaine NONE DETECTED  NONE DETECTED   Benzodiazepines NONE DETECTED  NONE DETECTED   Amphetamines  NONE DETECTED  NONE DETECTED   Tetrahydrocannabinol NONE DETECTED  NONE DETECTED   Barbiturates NONE DETECTED  NONE DETECTED   Comment:            DRUG SCREEN FOR MEDICAL PURPOSES     ONLY.  IF CONFIRMATION IS NEEDED     FOR ANY PURPOSE, NOTIFY LAB     WITHIN 5 DAYS.                LOWEST DETECTABLE LIMITS     FOR URINE DRUG SCREEN     Drug Class       Cutoff (ng/mL)     Amphetamine      1000     Barbiturate      200     Benzodiazepine   200     Tricyclics       300     Opiates  300     Cocaine          300     THC              50  CBC WITH DIFFERENTIAL     Status: Abnormal   Collection Time    07/26/12  4:20 AM      Result Value Range   WBC 7.5  4.0 - 10.5 K/uL   RBC 4.14  3.87 - 5.11 MIL/uL   Hemoglobin 11.8 (*) 12.0 - 15.0 g/dL   HCT 19.1 (*) 47.8 - 29.5 %   MCV 86.0  78.0 - 100.0 fL   MCH 28.5  26.0 - 34.0 pg   MCHC 33.1  30.0 - 36.0 g/dL   RDW 62.1  30.8 - 65.7 %   Platelets 303  150 - 400 K/uL   Neutrophils Relative 52  43 - 77 %   Neutro Abs 3.9  1.7 - 7.7 K/uL   Lymphocytes Relative 42  12 - 46 %   Lymphs Abs 3.2  0.7 - 4.0 K/uL   Monocytes Relative 4  3 - 12 %   Monocytes Absolute 0.3  0.1 - 1.0 K/uL   Eosinophils Relative 1  0 - 5 %   Eosinophils Absolute 0.1  0.0 - 0.7 K/uL   Basophils Relative 0  0 - 1 %   Basophils Absolute 0.0  0.0 - 0.1 K/uL  COMPREHENSIVE METABOLIC PANEL     Status: Abnormal   Collection Time    07/26/12  4:20 AM      Result Value Range   Sodium 145  135 - 145 mEq/L   Potassium 4.1  3.5 - 5.1 mEq/L   Chloride 109  96 - 112 mEq/L   CO2 28  19 - 32 mEq/L   Glucose, Bld 119 (*) 70 - 99 mg/dL   BUN 16  6 - 23 mg/dL   Creatinine, Ser 8.46  0.50 - 1.10 mg/dL   Calcium 9.3  8.4 - 96.2 mg/dL   Total Protein 7.1  6.0 - 8.3 g/dL   Albumin 3.2 (*) 3.5 - 5.2 g/dL   AST 13  0 - 37 U/L   ALT 13  0 - 35 U/L   Alkaline Phosphatase 86  39 - 117 U/L   Total Bilirubin 0.1 (*) 0.3 - 1.2 mg/dL   GFR calc non Af Amer 83 (*) >90 mL/min    GFR calc Af Amer >90  >90 mL/min   Comment:            The eGFR has been calculated     using the CKD EPI equation.     This calculation has not been     validated in all clinical     situations.     eGFR's persistently     <90 mL/min signify     possible Chronic Kidney Disease.  ETHANOL     Status: None   Collection Time    07/26/12  4:20 AM      Result Value Range   Alcohol, Ethyl (B) <11  0 - 11 mg/dL   Comment:            LOWEST DETECTABLE LIMIT FOR     SERUM ALCOHOL IS 11 mg/dL     FOR MEDICAL PURPOSES ONLY  PROTIME-INR     Status: None   Collection Time    07/26/12  4:20 AM      Result Value Range  Prothrombin Time 12.3  11.6 - 15.2 seconds   INR 0.92  0.00 - 1.49  APTT     Status: None   Collection Time    07/26/12  4:20 AM      Result Value Range   aPTT 34  24 - 37 seconds  MAGNESIUM     Status: None   Collection Time    07/26/12  4:20 AM      Result Value Range   Magnesium 1.8  1.5 - 2.5 mg/dL  PHOSPHORUS     Status: None   Collection Time    07/26/12  4:20 AM      Result Value Range   Phosphorus 2.8  2.3 - 4.6 mg/dL  VITAMIN Z61     Status: None   Collection Time    07/26/12  8:50 AM      Result Value Range   Vitamin B-12 234  211 - 911 pg/mL  FOLATE     Status: None   Collection Time    07/26/12  8:50 AM      Result Value Range   Folate 6.9     Comment: (NOTE)     Reference Ranges            Deficient:       0.4 - 3.3 ng/mL            Indeterminate:   3.4 - 5.4 ng/mL            Normal:              > 5.4 ng/mL  IRON AND TIBC     Status: Abnormal   Collection Time    07/26/12  8:50 AM      Result Value Range   Iron 42  42 - 135 ug/dL   TIBC 096  045 - 409 ug/dL   Saturation Ratios 16 (*) 20 - 55 %   UIBC 228  125 - 400 ug/dL  FERRITIN     Status: None   Collection Time    07/26/12  8:50 AM      Result Value Range   Ferritin 76  10 - 291 ng/mL  RETICULOCYTES     Status: None   Collection Time    07/26/12  8:50 AM      Result Value Range    Retic Ct Pct 1.1  0.4 - 3.1 %   RBC. 4.08  3.87 - 5.11 MIL/uL   Retic Count, Manual 44.9  19.0 - 186.0 K/uL  CBC     Status: Abnormal   Collection Time    07/26/12  8:50 AM      Result Value Range   WBC 7.7  4.0 - 10.5 K/uL   RBC 4.08  3.87 - 5.11 MIL/uL   Hemoglobin 11.4 (*) 12.0 - 15.0 g/dL   HCT 81.1 (*) 91.4 - 78.2 %   MCV 86.0  78.0 - 100.0 fL   MCH 27.9  26.0 - 34.0 pg   MCHC 32.5  30.0 - 36.0 g/dL   RDW 95.6  21.3 - 08.6 %   Platelets 309  150 - 400 K/uL  CREATININE, SERUM     Status: None   Collection Time    07/26/12  8:50 AM      Result Value Range   Creatinine, Ser 0.77  0.50 - 1.10 mg/dL   GFR calc non Af Amer >90  >90 mL/min   GFR calc Af Amer >90  >90  mL/min   Comment:            The eGFR has been calculated     using the CKD EPI equation.     This calculation has not been     validated in all clinical     situations.     eGFR's persistently     <90 mL/min signify     possible Chronic Kidney Disease.  MAGNESIUM     Status: None   Collection Time    07/26/12  8:50 AM      Result Value Range   Magnesium 1.7  1.5 - 2.5 mg/dL  PHOSPHORUS     Status: None   Collection Time    07/26/12  8:50 AM      Result Value Range   Phosphorus 3.2  2.3 - 4.6 mg/dL  URINE RAPID DRUG SCREEN (HOSP PERFORMED)     Status: Abnormal   Collection Time    07/26/12  7:19 PM      Result Value Range   Opiates POSITIVE (*) NONE DETECTED   Cocaine NONE DETECTED  NONE DETECTED   Benzodiazepines NONE DETECTED  NONE DETECTED   Amphetamines NONE DETECTED  NONE DETECTED   Tetrahydrocannabinol NONE DETECTED  NONE DETECTED   Barbiturates NONE DETECTED  NONE DETECTED   Comment:            DRUG SCREEN FOR MEDICAL PURPOSES     ONLY.  IF CONFIRMATION IS NEEDED     FOR ANY PURPOSE, NOTIFY LAB     WITHIN 5 DAYS.                LOWEST DETECTABLE LIMITS     FOR URINE DRUG SCREEN     Drug Class       Cutoff (ng/mL)     Amphetamine      1000     Barbiturate      200     Benzodiazepine    200     Tricyclics       300     Opiates          300     Cocaine          300     THC              50  BASIC METABOLIC PANEL     Status: None   Collection Time    07/27/12  5:35 AM      Result Value Range   Sodium 142  135 - 145 mEq/L   Potassium 3.5  3.5 - 5.1 mEq/L   Chloride 104  96 - 112 mEq/L   CO2 25  19 - 32 mEq/L   Glucose, Bld 92  70 - 99 mg/dL   BUN 9  6 - 23 mg/dL   Creatinine, Ser 1.61  0.50 - 1.10 mg/dL   Calcium 9.3  8.4 - 09.6 mg/dL   GFR calc non Af Amer >90  >90 mL/min   GFR calc Af Amer >90  >90 mL/min   Comment:            The eGFR has been calculated     using the CKD EPI equation.     This calculation has not been     validated in all clinical     situations.     eGFR's persistently     <90 mL/min signify     possible Chronic Kidney Disease.  CBC  Status: Abnormal   Collection Time    07/27/12  5:35 AM      Result Value Range   WBC 5.8  4.0 - 10.5 K/uL   RBC 4.10  3.87 - 5.11 MIL/uL   Hemoglobin 11.6 (*) 12.0 - 15.0 g/dL   HCT 62.1 (*) 30.8 - 65.7 %   MCV 82.9  78.0 - 100.0 fL   MCH 28.3  26.0 - 34.0 pg   MCHC 34.1  30.0 - 36.0 g/dL   RDW 84.6  96.2 - 95.2 %   Platelets 291  150 - 400 K/uL     HPI :Janashia Parco is a 47 y.o. female history of seizures last seizure was 5 years ago and is off anticonvulsants presents to the ER with complaints of seizures. Patient started feeling like dizziness was about to fall on the floor when patient's son helped her to the floor and after which patient does not remember what happened. As per patient's son patient had jerking and extending movements of the body after which patient was looking confused. Nose no associated tongue bite or incontinence of urine. After the episode in addition to the confusion patient also had some right upper and lower extremity weakness. In the ER CT head was negative. Neurologist on-call Dr. Thad Ranger have seen the patient and at this time no anticonvulsants have been started but  patient has been admitted for further management including getting an MRI of the brain and EEG. Patient pressures had been elevated and her patient states that she has not taken her antihypertensives for last few days. Patient otherwise denies any headache chest pain shortness of breath nausea vomiting abdominal pain diarrhea   HOSPITAL COURSE:  , Seizure disorder Abnormal EEG, started on Keppra Neurology consult No further neurodiagnostic testing indicated  Hypertension, continue outpatient medications Tobacco use patient advised to quit smoking Normocytic anemia-anemia panel shows low iron saturation B12 was also mildly low therefore we'll start the patient on sublingual B12,  Discharge Exam: * Blood pressure 137/96, pulse 89, temperature 98.3 F (36.8 C), temperature source Oral, resp. rate 18, height 5\' 4"  (1.626 m), weight 110.9 kg (244 lb 7.8 oz), SpO2 91.00%.  General: Well-built and nourished.  Eyes: Anicteric no pallor.  ENT: No facial asymmetry. No discharge from the ears eyes nose and mouth.  Neck: No mass felt. No neck rigidity.  Cardiovascular: S1-S2 heard.  Respiratory: No rhonchi no crepitations.  Abdomen: Soft nontender bowel sounds present.  Skin: No rash.  Musculoskeletal: No effusion or edema.  Psychiatric: Appears normal.  Neurologic: Patient has right upper and lower extremity weakness. The strength in the right upper and lower extremities is 3 x 5 and the left upper and lower extremities is 5 x 5. Tongue is midline no facial asymmetry.          Follow-up Information   Follow up with Primary care provider. Schedule an appointment as soon as possible for a visit in 1 week.      SignedRicharda Overlie 07/27/2012, 1:42 PM

## 2012-07-27 NOTE — Progress Notes (Signed)
Subjective: No recurrence of seizure activity. No new complaints. Strength of right extremities improving. History of seizures since 47 years old. Patient's last seizure was in 2009.  Objective: Current vital signs: BP 143/92  Pulse 70  Temp(Src) 98.9 F (37.2 C) (Oral)  Resp 18  Ht 5\' 4"  (1.626 m)  Wt 110.9 kg (244 lb 7.8 oz)  BMI 41.95 kg/m2  SpO2 96%  Neurologic Exam: Alert and in no acute distress. Affect somewhat depressed, mental status otherwise unremarkable. Oriented x3. Extraocular movements and visual fields were normal. No facial weakness. Normal speech and palatal movement. Equivocal weakness of right upper extremity with poor effort with manual testing. Normal strength of left upper extremity as well as both lower extremities with manual testing. Coordination of right upper extremity was equivocally impaired as patient's tremor was nonphysiologic and inconsistent.  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 Lab Results: Results for orders placed during the hospital encounter of 07/26/12 (from the past 48 hour(s))  GLUCOSE, CAPILLARY     Status: Abnormal   Collection Time    07/26/12  3:18 AM      Result Value Range   Glucose-Capillary 116 (*) 70 - 99 mg/dL  URINALYSIS, ROUTINE W REFLEX MICROSCOPIC     Status: Abnormal   Collection Time    07/26/12  4:15 AM      Result Value Range   Color, Urine YELLOW  YELLOW   APPearance CLOUDY (*) CLEAR   Specific Gravity, Urine 1.022  1.005 - 1.030   pH 7.0  5.0 - 8.0   Glucose, UA NEGATIVE  NEGATIVE mg/dL   Hgb urine dipstick NEGATIVE  NEGATIVE   Bilirubin Urine NEGATIVE  NEGATIVE   Ketones, ur NEGATIVE  NEGATIVE mg/dL   Protein, ur NEGATIVE  NEGATIVE mg/dL   Urobilinogen, UA 0.2  0.0 - 1.0 mg/dL   Nitrite NEGATIVE  NEGATIVE   Leukocytes, UA NEGATIVE   NEGATIVE   Comment: MICROSCOPIC NOT DONE ON URINES WITH NEGATIVE PROTEIN, BLOOD, LEUKOCYTES, NITRITE, OR GLUCOSE <1000 mg/dL.  URINE RAPID DRUG SCREEN (HOSP PERFORMED)     Status: Abnormal   Collection Time    07/26/12  4:16 AM      Result Value Range   Opiates POSITIVE (*) NONE DETECTED   Cocaine NONE DETECTED  NONE DETECTED   Benzodiazepines NONE DETECTED  NONE DETECTED   Amphetamines NONE DETECTED  NONE DETECTED   Tetrahydrocannabinol NONE DETECTED  NONE DETECTED   Barbiturates NONE DETECTED  NONE DETECTED   Comment:            DRUG SCREEN FOR MEDICAL PURPOSES     ONLY.  IF CONFIRMATION IS NEEDED     FOR ANY PURPOSE, NOTIFY LAB     WITHIN 5 DAYS.                LOWEST DETECTABLE LIMITS     FOR URINE DRUG SCREEN     Drug Class       Cutoff (ng/mL)     Amphetamine      1000     Barbiturate      200     Benzodiazepine   200     Tricyclics       300     Opiates          300     Cocaine  300     THC              50  CBC WITH DIFFERENTIAL     Status: Abnormal   Collection Time    07/26/12  4:20 AM      Result Value Range   WBC 7.5  4.0 - 10.5 K/uL   RBC 4.14  3.87 - 5.11 MIL/uL   Hemoglobin 11.8 (*) 12.0 - 15.0 g/dL   HCT 14.7 (*) 82.9 - 56.2 %   MCV 86.0  78.0 - 100.0 fL   MCH 28.5  26.0 - 34.0 pg   MCHC 33.1  30.0 - 36.0 g/dL   RDW 13.0  86.5 - 78.4 %   Platelets 303  150 - 400 K/uL   Neutrophils Relative 52  43 - 77 %   Neutro Abs 3.9  1.7 - 7.7 K/uL   Lymphocytes Relative 42  12 - 46 %   Lymphs Abs 3.2  0.7 - 4.0 K/uL   Monocytes Relative 4  3 - 12 %   Monocytes Absolute 0.3  0.1 - 1.0 K/uL   Eosinophils Relative 1  0 - 5 %   Eosinophils Absolute 0.1  0.0 - 0.7 K/uL   Basophils Relative 0  0 - 1 %   Basophils Absolute 0.0  0.0 - 0.1 K/uL  COMPREHENSIVE METABOLIC PANEL     Status: Abnormal   Collection Time    07/26/12  4:20 AM      Result Value Range   Sodium 145  135 - 145 mEq/L   Potassium 4.1  3.5 - 5.1 mEq/L   Chloride 109  96 - 112 mEq/L   CO2  28  19 - 32 mEq/L   Glucose, Bld 119 (*) 70 - 99 mg/dL   BUN 16  6 - 23 mg/dL   Creatinine, Ser 6.96  0.50 - 1.10 mg/dL   Calcium 9.3  8.4 - 29.5 mg/dL   Total Protein 7.1  6.0 - 8.3 g/dL   Albumin 3.2 (*) 3.5 - 5.2 g/dL   AST 13  0 - 37 U/L   ALT 13  0 - 35 U/L   Alkaline Phosphatase 86  39 - 117 U/L   Total Bilirubin 0.1 (*) 0.3 - 1.2 mg/dL   GFR calc non Af Amer 83 (*) >90 mL/min   GFR calc Af Amer >90  >90 mL/min   Comment:            The eGFR has been calculated     using the CKD EPI equation.     This calculation has not been     validated in all clinical     situations.     eGFR's persistently     <90 mL/min signify     possible Chronic Kidney Disease.  ETHANOL     Status: None   Collection Time    07/26/12  4:20 AM      Result Value Range   Alcohol, Ethyl (B) <11  0 - 11 mg/dL   Comment:            LOWEST DETECTABLE LIMIT FOR     SERUM ALCOHOL IS 11 mg/dL     FOR MEDICAL PURPOSES ONLY  PROTIME-INR     Status: None   Collection Time    07/26/12  4:20 AM      Result Value Range   Prothrombin Time 12.3  11.6 - 15.2 seconds   INR 0.92  0.00 -  1.49  APTT     Status: None   Collection Time    07/26/12  4:20 AM      Result Value Range   aPTT 34  24 - 37 seconds  MAGNESIUM     Status: None   Collection Time    07/26/12  4:20 AM      Result Value Range   Magnesium 1.8  1.5 - 2.5 mg/dL  PHOSPHORUS     Status: None   Collection Time    07/26/12  4:20 AM      Result Value Range   Phosphorus 2.8  2.3 - 4.6 mg/dL  VITAMIN X91     Status: None   Collection Time    07/26/12  8:50 AM      Result Value Range   Vitamin B-12 234  211 - 911 pg/mL  FOLATE     Status: None   Collection Time    07/26/12  8:50 AM      Result Value Range   Folate 6.9     Comment: (NOTE)     Reference Ranges            Deficient:       0.4 - 3.3 ng/mL            Indeterminate:   3.4 - 5.4 ng/mL            Normal:              > 5.4 ng/mL  IRON AND TIBC     Status: Abnormal    Collection Time    07/26/12  8:50 AM      Result Value Range   Iron 42  42 - 135 ug/dL   TIBC 478  295 - 621 ug/dL   Saturation Ratios 16 (*) 20 - 55 %   UIBC 228  125 - 400 ug/dL  FERRITIN     Status: None   Collection Time    07/26/12  8:50 AM      Result Value Range   Ferritin 76  10 - 291 ng/mL  RETICULOCYTES     Status: None   Collection Time    07/26/12  8:50 AM      Result Value Range   Retic Ct Pct 1.1  0.4 - 3.1 %   RBC. 4.08  3.87 - 5.11 MIL/uL   Retic Count, Manual 44.9  19.0 - 186.0 K/uL  CBC     Status: Abnormal   Collection Time    07/26/12  8:50 AM      Result Value Range   WBC 7.7  4.0 - 10.5 K/uL   RBC 4.08  3.87 - 5.11 MIL/uL   Hemoglobin 11.4 (*) 12.0 - 15.0 g/dL   HCT 30.8 (*) 65.7 - 84.6 %   MCV 86.0  78.0 - 100.0 fL   MCH 27.9  26.0 - 34.0 pg   MCHC 32.5  30.0 - 36.0 g/dL   RDW 96.2  95.2 - 84.1 %   Platelets 309  150 - 400 K/uL  CREATININE, SERUM     Status: None   Collection Time    07/26/12  8:50 AM      Result Value Range   Creatinine, Ser 0.77  0.50 - 1.10 mg/dL   GFR calc non Af Amer >90  >90 mL/min   GFR calc Af Amer >90  >90 mL/min   Comment:  The eGFR has been calculated     using the CKD EPI equation.     This calculation has not been     validated in all clinical     situations.     eGFR's persistently     <90 mL/min signify     possible Chronic Kidney Disease.  MAGNESIUM     Status: None   Collection Time    07/26/12  8:50 AM      Result Value Range   Magnesium 1.7  1.5 - 2.5 mg/dL  PHOSPHORUS     Status: None   Collection Time    07/26/12  8:50 AM      Result Value Range   Phosphorus 3.2  2.3 - 4.6 mg/dL  URINE RAPID DRUG SCREEN (HOSP PERFORMED)     Status: Abnormal   Collection Time    07/26/12  7:19 PM      Result Value Range   Opiates POSITIVE (*) NONE DETECTED   Cocaine NONE DETECTED  NONE DETECTED   Benzodiazepines NONE DETECTED  NONE DETECTED   Amphetamines NONE DETECTED  NONE DETECTED    Tetrahydrocannabinol NONE DETECTED  NONE DETECTED   Barbiturates NONE DETECTED  NONE DETECTED   Comment:            DRUG SCREEN FOR MEDICAL PURPOSES     ONLY.  IF CONFIRMATION IS NEEDED     FOR ANY PURPOSE, NOTIFY LAB     WITHIN 5 DAYS.                LOWEST DETECTABLE LIMITS     FOR URINE DRUG SCREEN     Drug Class       Cutoff (ng/mL)     Amphetamine      1000     Barbiturate      200     Benzodiazepine   200     Tricyclics       300     Opiates          300     Cocaine          300     THC              50  BASIC METABOLIC PANEL     Status: None   Collection Time    07/27/12  5:35 AM      Result Value Range   Sodium 142  135 - 145 mEq/L   Potassium 3.5  3.5 - 5.1 mEq/L   Chloride 104  96 - 112 mEq/L   CO2 25  19 - 32 mEq/L   Glucose, Bld 92  70 - 99 mg/dL   BUN 9  6 - 23 mg/dL   Creatinine, Ser 9.60  0.50 - 1.10 mg/dL   Calcium 9.3  8.4 - 45.4 mg/dL   GFR calc non Af Amer >90  >90 mL/min   GFR calc Af Amer >90  >90 mL/min   Comment:            The eGFR has been calculated     using the CKD EPI equation.     This calculation has not been     validated in all clinical     situations.     eGFR's persistently     <90 mL/min signify     possible Chronic Kidney Disease.  CBC     Status: Abnormal   Collection Time    07/27/12  5:35 AM  Result Value Range   WBC 5.8  4.0 - 10.5 K/uL   RBC 4.10  3.87 - 5.11 MIL/uL   Hemoglobin 11.6 (*) 12.0 - 15.0 g/dL   HCT 96.0 (*) 45.4 - 09.8 %   MCV 82.9  78.0 - 100.0 fL   MCH 28.3  26.0 - 34.0 pg   MCHC 34.1  30.0 - 36.0 g/dL   RDW 11.9  14.7 - 82.9 %   Platelets 291  150 - 400 K/uL    Studies/Results: Ct Head Wo Contrast  07/26/2012  *RADIOLOGY REPORT*  Clinical Data: Seizure.  CT HEAD WITHOUT CONTRAST  Technique:  Contiguous axial images were obtained from the base of the skull through the vertex without contrast.  Comparison: CT of the head performed 03/31/2008  Findings: There is no evidence of acute infarction, mass  lesion, or intra- or extra-axial hemorrhage on CT.  The posterior fossa, including the cerebellum, brainstem and fourth ventricle, is within normal limits.  The third and lateral ventricles, and basal ganglia are unremarkable in appearance.  The cerebral hemispheres are symmetric in appearance, with normal gray- white differentiation.  No mass effect or midline shift is seen.  There is no evidence of fracture; visualized osseous structures are unremarkable in appearance.  The visualized portions of the orbits are within normal limits.  The paranasal sinuses and mastoid air cells are well-aerated.  No significant soft tissue abnormalities are seen.  IMPRESSION: Unremarkable noncontrast CT of the head.   Original Report Authenticated By: Tonia Ghent, M.D.    Mr Brain Wo Contrast  07/26/2012  *RADIOLOGY REPORT*  Clinical Data: Seizure with right-sided paralysis  MRI HEAD WITHOUT CONTRAST  Technique:  Multiplanar, multiecho pulse sequences of the brain and surrounding structures were obtained according to standard protocol without intravenous contrast.  Comparison: CT head 07/27/2010  Findings: Cerebellar tonsils are low-lying measuring approximately 5.6 mm below the foramen magnum.  Mild impaction of the tonsils is noted.  Negative for hydrocephalus.  Pituitary gland is normal in size.  Small hyperintensity in the deep white matter on the left compatible with chronic ischemia. Chronic lacuna in the left putamen.  Negative for acute infarct.  No other areas of ischemia are seen.  Negative for demyelinating disease.  Brainstem is intact.  Negative for intracranial hemorrhage or mass lesion.  No edema or midline shift.  Hippocampus is symmetric and normal in volume bilaterally.  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  No acute infarct or mass.   Original Report Authenticated By: Janeece Riggers, M.D.    Mr Laqueta Jean Contrast  07/27/2012  *RADIOLOGY REPORT*   Clinical Data: History of seizures from head trauma as child. Recent seizure.  High blood pressure.  MRI HEAD WITH CONTRAST  Technique:  Multiplanar, multiecho pulse sequences of the brain and surrounding structures were obtained according to standard protocol with intravenous contrast  Contrast: 20mL MULTIHANCE GADOBENATE DIMEGLUMINE 529 MG/ML IV SOLN  Comparison: Unenhanced MRI the brain 07/26/2012.  Head CT 07/26/2012.  Findings: No intracranial mass or abnormal enhancement.  Cerebellar tonsils slightly low-lying.  Sella partially empty. This may be normal although also described in patients with pseudotumor cerebri.  Remainder of findings as described on noncontrast enhanced MR performed same date.  IMPRESSION: No intracranial mass or abnormal enhancement.  Please see above.   Original Report Authenticated By: Lacy Duverney, M.D.     Medications:  Scheduled: . enoxaparin (LOVENOX) injection  40 mg  Subcutaneous Q24H  . hydrochlorothiazide  12.5 mg Oral Daily  . irbesartan  300 mg Oral Daily  . labetalol  5 mg Intravenous Once  . levETIRAcetam  500 mg Oral BID  . levETIRAcetam  500 mg Oral BID  . 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 Todd's paralysis involving right extremities resolving. No acute changes seen on MRI of the brain. EEG was abnormal with evidence of left temporal seizure focus.  I will start this patient on Keppra for anti-seizure maintenance, 500 mg twice a day. Physical therapy intervention recommended regarding Todd's paralysis. To further neurodiagnostic studies are indicated at this point.  C.R. Roseanne Reno, MD [facility] [phone]  07/27/2012  10:02 AM

## 2012-07-27 NOTE — Care Management Note (Signed)
    Page 1 of 1   07/28/2012     1:59:23 PM   CARE MANAGEMENT NOTE 07/28/2012  Patient:  [name], [name]   Account Number:  [id number]  Date Initiated:  07/27/2012  Documentation initiated by:  AMERSON,JULIE  Subjective/Objective Assessment:   PT ADM WITH SEIZURE ON 07/26/12; PMH OF EPILEPSY.  PTA, PT LIVES AT HOME WITH FAMILY AND IS INDEPENDENT.     Action/Plan:   WILL FOLLOW FOR HOME NEEDS AS PT PROGRESSES.   Anticipated DC Date:  07/27/2012   Anticipated DC Plan:  HOME W HOME HEALTH SERVICES      DC Planning Services  CM consult      [facility] Choice  HOME HEALTH   Choice offered to / List presented to:  C-1 Patient        HH arranged  HH-2 PT  HH-3 OT      [facility] agency  [facility]   Status of service:  Completed, signed off Medicare Important Message given?   (If response is "NO", the following Medicare IM given date fields will be blank) Date Medicare IM given:   Date Additional Medicare IM given:    Discharge Disposition:  HOME W HOME HEALTH SERVICES  Per UR Regulation:  Reviewed for med. necessity/level of care/duration of stay  If discussed at Long Length of Stay Meetings, dates discussed:    Comments:  07/27/12 Rosalita Chessman [phone] PT FOR DC HOME TODAY.  PT NEEDS HH FOLLOW UP.  REFERRAL TO [facility], PER PT CHOICE.  START OF CARE 24-48H POST DC DATE.

## 2013-01-14 ENCOUNTER — Encounter (HOSPITAL_COMMUNITY): Payer: Self-pay

## 2013-01-14 ENCOUNTER — Emergency Department (HOSPITAL_COMMUNITY): Payer: Medicare Other

## 2013-01-14 ENCOUNTER — Emergency Department (HOSPITAL_COMMUNITY)
Admission: EM | Admit: 2013-01-14 | Discharge: 2013-01-14 | Disposition: A | Discharge status: Home / Self Care | Payer: Medicare Other | Attending: Emergency Medicine | Admitting: Emergency Medicine

## 2013-01-14 DIAGNOSIS — M549 Dorsalgia, unspecified: Secondary | ICD-10-CM | POA: Insufficient documentation

## 2013-01-14 DIAGNOSIS — I10 Essential (primary) hypertension: Secondary | ICD-10-CM | POA: Insufficient documentation

## 2013-01-14 DIAGNOSIS — Z8669 Personal history of other diseases of the nervous system and sense organs: Secondary | ICD-10-CM | POA: Insufficient documentation

## 2013-01-14 DIAGNOSIS — Z3202 Encounter for pregnancy test, result negative: Secondary | ICD-10-CM | POA: Insufficient documentation

## 2013-01-14 DIAGNOSIS — Z8742 Personal history of other diseases of the female genital tract: Secondary | ICD-10-CM | POA: Insufficient documentation

## 2013-01-14 DIAGNOSIS — Z880 Allergy status to penicillin: Secondary | ICD-10-CM | POA: Insufficient documentation

## 2013-01-14 DIAGNOSIS — F17200 Nicotine dependence, unspecified, uncomplicated: Secondary | ICD-10-CM | POA: Insufficient documentation

## 2013-01-14 DIAGNOSIS — R569 Unspecified convulsions: Secondary | ICD-10-CM

## 2013-01-14 DIAGNOSIS — G8929 Other chronic pain: Secondary | ICD-10-CM | POA: Insufficient documentation

## 2013-01-14 DIAGNOSIS — R4789 Other speech disturbances: Secondary | ICD-10-CM | POA: Insufficient documentation

## 2013-01-14 DIAGNOSIS — R5381 Other malaise: Secondary | ICD-10-CM | POA: Insufficient documentation

## 2013-01-14 DIAGNOSIS — G40909 Epilepsy, unspecified, not intractable, without status epilepticus: Secondary | ICD-10-CM | POA: Insufficient documentation

## 2013-01-14 DIAGNOSIS — E669 Obesity, unspecified: Secondary | ICD-10-CM | POA: Insufficient documentation

## 2013-01-14 DIAGNOSIS — Z79899 Other long term (current) drug therapy: Secondary | ICD-10-CM | POA: Insufficient documentation

## 2013-01-14 DIAGNOSIS — M129 Arthropathy, unspecified: Secondary | ICD-10-CM | POA: Insufficient documentation

## 2013-01-14 LAB — URINALYSIS, ROUTINE W REFLEX MICROSCOPIC
Bilirubin Urine: NEGATIVE
Ketones, ur: NEGATIVE mg/dL
Nitrite: NEGATIVE
Protein, ur: NEGATIVE mg/dL
Urobilinogen, UA: 0.2 mg/dL (ref 0.0–1.0)

## 2013-01-14 LAB — COMPREHENSIVE METABOLIC PANEL
CO2: 25 mEq/L (ref 19–32)
Calcium: 8.9 mg/dL (ref 8.4–10.5)
Creatinine, Ser: 0.71 mg/dL (ref 0.50–1.10)
GFR calc Af Amer: >90 mL/min (ref >90)
GFR calc non Af Amer: >90 mL/min (ref >90)
Glucose, Bld: 102 mg/dL — ABNORMAL HIGH (ref 70–99)

## 2013-01-14 LAB — APTT: aPTT: 30 seconds (ref 24–37)

## 2013-01-14 LAB — RAPID URINE DRUG SCREEN, HOSP PERFORMED
Amphetamines: NOT DETECTED
Cocaine: NOT DETECTED
Opiates: POSITIVE — AB

## 2013-01-14 LAB — CBC
Hemoglobin: 12.1 g/dL (ref 12.0–15.0)
MCH: 28.5 pg (ref 26.0–34.0)
MCHC: 33.7 g/dL (ref 30.0–36.0)
MCV: 84.7 fL (ref 78.0–100.0)
Platelets: 325 10*3/uL (ref 150–400)
RBC: 4.24 MIL/uL (ref 3.87–5.11)

## 2013-01-14 LAB — PROTIME-INR: INR: 1.02 (ref 0.00–1.49)

## 2013-01-14 MED ORDER — HYDROCODONE-ACETAMINOPHEN 5-325 MG PO TABS
2.0000 | ORAL_TABLET | Freq: Once | ORAL | Status: AC
  Administered 2013-01-14: 2 via ORAL
  Filled 2013-01-14: qty 2

## 2013-01-14 NOTE — ED Notes (Signed)
Pt with hx of stroke 03/14.  Pt was attending wedding and has residual problems with ambulation.  Pt went to car to wait for family member to drive her down to basement of church.  Family didn't go to car for 10 to 15 min at which time pt was sitting in hot car.  When family went out to car, pt was attempting to get out of car.  Family noted that pt had slurred speech.  Upon EMS arrival, pt speech clear.  Grips equal.  No other symptoms.

## 2013-01-14 NOTE — ED Provider Notes (Signed)
Medical screening examination/treatment/procedure(s) were performed by non-physician practitioner and as supervising physician I was immediately available for consultation/collaboration.  Everson Mott R. Lashanda Storlie, MD 01/14/13 [id number] 

## 2013-01-14 NOTE — ED Provider Notes (Signed)
CSN: [id number]     Arrival date & time 01/14/13  1734 History     First MD Initiated Contact with Patient 01/14/13 1746     Chief Complaint  Patient presents with  . Transient Ischemic Attack   (Consider location/radiation/quality/duration/timing/severity/associated sxs/prior Treatment) The history is provided by the patient and medical records. No language interpreter was used.    [name] is a 47 y.o. 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  Patient was sitting in a hot car when she became weak and had an aura. Patient states she smelled the same smells she does before she has a seizure. She states her last seizure was in March of 2014 and prior to that was 5 years before. Patient states after seizure in March she began to take Keppra. EMS reports that bystanders noted generalized weakness and slurred speech which had resolved by the time they arrived. Patient states she feels 90% back to normal her speech still feels low. Patient reports she took off her medications as directed this morning, and has been since discharge.  EMS reports no focal neuro deficit and no slurred speech on exam. Patient states her history of seizures is usually grand mal but someone to just to her that she might of had a [place] seizure today.    Past Medical History  Diagnosis Date  . Hypertension   . Seizures   . Arthritis   . Chronic back pain   . Single stillborn delivery outcome   . Fibroids   . Carpal tunnel syndrome     chronoic muscle spasms  & back pain  . Smoker    Past Surgical History  Procedure Laterality Date  . Tubal ligation    . Abdominal hysterectomy     Family  History  Problem Relation Age of Onset  . Hypertension Mother   . Hypertension Father   . Diabetes Mellitus II Father   . CAD Other    History  Substance Use Topics  . Smoking status: Current Every Day Smoker -- 0.50 packs/day for 30 years    Types: Cigarettes  . Smokeless tobacco: Never Used  . Alcohol Use: No   OB History   Grav Para Term Preterm Abortions TAB SAB Ect Mult Living                 Review of Systems  Constitutional: Negative for fever, diaphoresis, appetite change, fatigue and unexpected weight change.  HENT: Negative for mouth sores and neck stiffness.   Eyes: Negative for visual disturbance.  Respiratory: Negative for cough, chest tightness, shortness of breath and wheezing.   Cardiovascular: Negative for chest pain.  Gastrointestinal: Negative for nausea, vomiting, abdominal pain, diarrhea and constipation.  Endocrine: Negative for polydipsia, polyphagia and polyuria.  Genitourinary: Negative for dysuria, urgency, frequency and hematuria.  Musculoskeletal: Negative for back pain.  Skin: Negative for rash.  Allergic/Immunologic: Negative for immunocompromised state.  Neurological: Positive for speech difficulty and weakness. Negative for syncope, light-headedness and headaches.       Aura  Hematological: Does not bruise/bleed easily.  Psychiatric/Behavioral: Negative for sleep disturbance. The patient is not nervous/anxious.     Allergies  Aspirin and Penicillins  Home Medications   Current  Outpatient Rx  Name  Route  Sig  Dispense  Refill  . carisoprodol (SOMA) 350 MG tablet   Oral   Take 350 mg by mouth 3 (three) times daily as needed for muscle spasms.         Marland Kitchen HYDROcodone-acetaminophen (NORCO) 10-325 MG per tablet   Oral   Take 1 tablet by mouth every 6 (six) hours as needed for pain.         Marland Kitchen ibuprofen (ADVIL,MOTRIN) 200 MG tablet   Oral   Take 200 mg by mouth every 6 (six) hours as needed for pain.         Marland Kitchen levETIRAcetam  (KEPPRA) 500 MG tablet   Oral   Take 1 tablet (500 mg total) by mouth 2 (two) times daily.   60 tablet   3   . valsartan-hydrochlorothiazide (DIOVAN-HCT) 320-12.5 MG per tablet   Oral   Take 1 tablet by mouth daily.         . vitamin B-12 (CYANOCOBALAMIN) 500 MCG tablet   Oral   Take 2 tablets (1,000 mcg total) by mouth daily.   30 tablet   10     Can also prescribe sublingual B12 available    BP 162/95  Pulse 73  Temp(Src) 98 F (36.7 C) (Oral)  Resp 25  Ht 5\' 5"  (1.651 m)  Wt 233 lb (105.688 kg)  BMI 38.77 kg/m2  SpO2 100% Physical Exam  Nursing note and vitals reviewed. Constitutional: She is oriented to person, place, and time. She appears well-developed and well-nourished. No distress.  Awake, alert, nontoxic appearance  HENT:  Head: Normocephalic and atraumatic.  Nose: Nose normal.  Mouth/Throat: Uvula is midline, oropharynx is clear and moist and mucous membranes are normal. Mucous membranes are not dry. No edematous. No oropharyngeal exudate, posterior oropharyngeal edema, posterior oropharyngeal erythema or tonsillar abscesses.  Eyes: Conjunctivae and EOM are normal. Pupils are equal, round, and reactive to light. Right eye exhibits no discharge. Left eye exhibits no discharge. No scleral icterus.  Neck: Normal range of motion. Neck supple.  Cardiovascular: Normal rate, regular rhythm, S1 normal, S2 normal, normal heart sounds and intact distal pulses.   No murmur heard. Pulses:      Radial pulses are 2+ on the right side, and 2+ on the left side.       Dorsalis pedis pulses are 2+ on the right side, and 2+ on the left side.       Posterior tibial pulses are 2+ on the right side, and 2+ on the left side.  Capillary refill less than 3  Pulmonary/Chest: Effort normal and breath sounds normal. No accessory muscle usage. Not tachypneic. No respiratory distress. She has no decreased breath sounds. She has no wheezes. She has no rhonchi. She has no rales. She  exhibits no tenderness and no bony tenderness.  Abdominal: Soft. Bowel sounds are normal. She exhibits no mass. There is no tenderness. There is no rigidity, no rebound and no guarding.  Obese  Musculoskeletal: Normal range of motion. She exhibits no edema.  Lymphadenopathy:    She has no cervical adenopathy.  Neurological: She is alert and oriented to person, place, and time. She has normal reflexes. No cranial nerve deficit. She exhibits normal muscle tone. Coordination normal. GCS eye subscore is 4. GCS verbal subscore is 5. GCS motor subscore is 6.  Reflex Scores:      Tricep reflexes are 2+ on the right side and 2+ on the left side.  Bicep reflexes are 2+ on the right side and 2+ on the left side.      Brachioradialis reflexes are 2+ on the right side and 2+ on the left side.      Patellar reflexes are 2+ on the right side and 2+ on the left side.      Achilles reflexes are 2+ on the right side and 2+ on the left side. Speech is clear and goal oriented, follows commands Major Cranial nerves without deficit, no facial droop Normal strength in upper and lower extremities bilaterally including dorsiflexion and plantar flexion, strong and equal grip strength Sensation normal to light and sharp touch Moves extremities without ataxia, coordination intact Normal finger to nose and rapid alternating movements Neg romberg, no pronator drift Normal gait and balance   Skin: Skin is warm and dry. She is not diaphoretic.  Psychiatric: She has a normal mood and affect.    ED Course   Procedures (including critical care time)  Labs Reviewed  URINE RAPID DRUG SCREEN (HOSP PERFORMED) - Abnormal; Notable for the following:    Opiates POSITIVE (*)    All other components within normal limits  CBC - Abnormal; Notable for the following:    HCT 35.9 (*)    All other components within normal limits  COMPREHENSIVE METABOLIC PANEL - Abnormal; Notable for the following:    Potassium 3.4 (*)     Glucose, Bld 102 (*)    Albumin 3.2 (*)    Total Bilirubin 0.1 (*)    All other components within normal limits  URINALYSIS, ROUTINE W REFLEX MICROSCOPIC - Abnormal; Notable for the following:    Specific Gravity, Urine 1.037 (*)    All other components within normal limits  PROTIME-INR  APTT  PREGNANCY, URINE  POCT I-STAT TROPONIN I   ECG:  Date: 01/14/2013  Rate: 80  Rhythm: normal sinus rhythm  QRS Axis: normal  Intervals: normal  ST/T Wave abnormalities: normal  Conduction Disutrbances:none  Narrative Interpretation: Nonischemic ECG, unchanged from 07/27/2011  Old EKG Reviewed: unchanged    Ct Head Wo Contrast  01/14/2013   *RADIOLOGY REPORT*  Clinical Data: Syncope.  Possible seizure.  Amnesia to the event. Tinnitus.  CT HEAD WITHOUT CONTRAST  Technique:  Contiguous axial images were obtained from the base of the skull through the vertex without contrast.  Comparison: MRI brain 07/26/2012.  CT head 07/26/2012, 03/31/2008, 12/05/2007.  Findings: Tiny lacunar strokes or dilated perivascular spaces in the left basal ganglia, unchanged from the prior MR.  No new intracranial abnormality.  Ventricular system normal in size appearance for age.  No mass lesion.  No midline shift.  No acute hemorrhage or hematoma.  No extra-axial fluid collections.  No evidence of acute infarction.  No skull fracture or other focal osseous abnormality involving the skull.  Visualized paranasal sinuses, bilateral mastoid air cells, and bilateral middle ear cavities well-aerated.  IMPRESSION:  1.  No acute intracranial abnormality. 2.  Stable tiny old lacunar strokes versus dilated perivascular spaces in the left basal ganglia.   Original Report Authenticated By: Hulan Saas, M.D.   1. Partial seizure     MDM  Judene Companion presents with slurred speech questionable seizure activity. Patient reports history of CVA in March for record review appears the patient had a grand mal seizure at that time was  diagnosed with Todd paralysis afterwards. She's made a full recovery and has no right-sided weakness prior to today's episode.  Normal neurologic exam on arrival.  Stroke  scale negative.  Will obtain labs and imaging.  Patient alert, oriented, nontoxic, nonseptic appearing at this time.  9:54 PM Discussed with Dr. Amada Jupiter of neurology.  He states that since patient had possible staring spell, normal aura and history of Todd paralysis recommend increase in her Keppra to 750 mg twice a day. Patient has remained neurologically intact here in the department. She walks without difficulty and without change in gait.  We'll discharge home with neurology followup.  I have also discussed reasons to return immediately to the ER.  Patient expresses understanding and agrees with plan.    Dahlia Client Ricard Faulkner, PA-C 01/14/13 2201  Dierdre Forth, PA-C 01/14/13 2202

## 2013-01-14 NOTE — ED Notes (Signed)
Pt ambulated in hall. Pt states that she did not feel dizzy or lightheaded during the walk. Pt did not become short of breath. Very minimal assistance needed.

## 2013-06-17 ENCOUNTER — Emergency Department (HOSPITAL_COMMUNITY)
Admission: EM | Admit: 2013-06-17 | Discharge: 2013-06-18 | Disposition: A | Discharge status: Home / Self Care | Payer: Medicare Other | Attending: Emergency Medicine | Admitting: Emergency Medicine

## 2013-06-17 ENCOUNTER — Encounter (HOSPITAL_COMMUNITY): Payer: Self-pay | Admitting: Emergency Medicine

## 2013-06-17 DIAGNOSIS — G8929 Other chronic pain: Secondary | ICD-10-CM | POA: Insufficient documentation

## 2013-06-17 DIAGNOSIS — Z79899 Other long term (current) drug therapy: Secondary | ICD-10-CM | POA: Insufficient documentation

## 2013-06-17 DIAGNOSIS — I10 Essential (primary) hypertension: Secondary | ICD-10-CM | POA: Insufficient documentation

## 2013-06-17 DIAGNOSIS — M549 Dorsalgia, unspecified: Secondary | ICD-10-CM

## 2013-06-17 DIAGNOSIS — IMO0002 Reserved for concepts with insufficient information to code with codable children: Secondary | ICD-10-CM | POA: Insufficient documentation

## 2013-06-17 DIAGNOSIS — G40909 Epilepsy, unspecified, not intractable, without status epilepticus: Secondary | ICD-10-CM | POA: Insufficient documentation

## 2013-06-17 DIAGNOSIS — M129 Arthropathy, unspecified: Secondary | ICD-10-CM | POA: Insufficient documentation

## 2013-06-17 DIAGNOSIS — E669 Obesity, unspecified: Secondary | ICD-10-CM | POA: Insufficient documentation

## 2013-06-17 DIAGNOSIS — F17200 Nicotine dependence, unspecified, uncomplicated: Secondary | ICD-10-CM | POA: Insufficient documentation

## 2013-06-17 DIAGNOSIS — G5600 Carpal tunnel syndrome, unspecified upper limb: Secondary | ICD-10-CM | POA: Insufficient documentation

## 2013-06-17 DIAGNOSIS — Z880 Allergy status to penicillin: Secondary | ICD-10-CM | POA: Insufficient documentation

## 2013-06-17 MED ORDER — HYDROMORPHONE HCL PF 1 MG/ML IJ SOLN
1.0000 mg | Freq: Once | INTRAMUSCULAR | Status: AC
  Administered 2013-06-17: 1 mg via INTRAMUSCULAR
  Filled 2013-06-17: qty 1

## 2013-06-17 MED ORDER — DIAZEPAM 5 MG/ML IJ SOLN
5.0000 mg | Freq: Once | INTRAMUSCULAR | Status: AC
  Administered 2013-06-17: 5 mg via INTRAMUSCULAR
  Filled 2013-06-17: qty 2

## 2013-06-17 NOTE — ED Provider Notes (Signed)
CSN: [id number]     Arrival date & time 06/17/13  2120 History   First MD Initiated Contact with Patient 06/17/13 2304     Chief Complaint  Patient presents with  . Back Pain  . Leg Pain   (Consider location/radiation/quality/duration/timing/severity/associated sxs/prior Treatment) HPI Comments: Pt is a 48 y/o female with a PMHx of chronic back pain who presents to the ED complaining of an exacerbation of of her back pain over the past 4 days. Pt states she has an appointment with the pain clinic next week, she ran out of her pain medications about 1 week ago. No new injury or trauma, states this is the same pain she usually experiences. Pain located across lower back, radiating down bilateral legs, worse going from laying to sitting or sitting to standing. Denies numbness or tingling. Denies loss of control of bowels or bladder or saddle anesthesia. No fever, chills or night sweats.  Patient is a 48 y.o. female presenting with back pain and leg pain. The history is provided by the patient.  Back Pain Associated symptoms: leg pain   Leg Pain Associated symptoms: back pain     Past Medical History  Diagnosis Date  . Hypertension   . Seizures   . Arthritis   . Chronic back pain   . Single stillborn delivery outcome   . Fibroids   . Carpal tunnel syndrome     chronoic muscle spasms  & back pain  . Smoker    Past Surgical History  Procedure Laterality Date  . Tubal ligation    . Abdominal hysterectomy     Family History  Problem Relation Age of Onset  . Hypertension Mother   . Hypertension Father   . Diabetes Mellitus II Father   . CAD Other    History  Substance Use Topics  . Smoking status: Current Every Day Smoker -- 0.50 packs/day for 30 years    Types: Cigarettes  . Smokeless tobacco: Never Used  . Alcohol Use: No   OB History   Grav Para Term Preterm Abortions TAB SAB Ect Mult Living                 Review of Systems  Musculoskeletal: Positive for back pain.    All other systems reviewed and are negative.    Allergies  Aspirin and Penicillins  Home Medications   Current Outpatient Rx  Name  Route  Sig  Dispense  Refill  . carisoprodol (SOMA) 350 MG tablet   Oral   Take 350 mg by mouth 3 (three) times daily as needed for muscle spasms.         Marland Kitchen HYDROcodone-acetaminophen (NORCO) 10-325 MG per tablet   Oral   Take 1 tablet by mouth every 6 (six) hours as needed for pain.         Marland Kitchen ibuprofen (ADVIL,MOTRIN) 200 MG tablet   Oral   Take 200 mg by mouth every 6 (six) hours as needed for pain.         Marland Kitchen levETIRAcetam (KEPPRA) 500 MG tablet   Oral   Take 1 tablet (500 mg total) by mouth 2 (two) times daily.   60 tablet   3   . valsartan-hydrochlorothiazide (DIOVAN-HCT) 320-12.5 MG per tablet   Oral   Take 1 tablet by mouth daily.          BP 173/113  Pulse 80  Temp(Src) 98.3 F (36.8 C) (Oral)  Resp 18  SpO2 99% Physical Exam  Nursing note and vitals reviewed. Constitutional: She is oriented to person, place, and time. She appears well-developed and well-nourished. No distress.  Obese, tearful.  HENT:  Head: Normocephalic and atraumatic.  Mouth/Throat: Oropharynx is clear and moist.  Eyes: Conjunctivae are normal.  Neck: Normal range of motion. Neck supple.  Cardiovascular: Normal rate, regular rhythm and normal heart sounds.   Pulmonary/Chest: Effort normal and breath sounds normal.  Abdominal: Soft. Bowel sounds are normal. There is no tenderness.  Musculoskeletal: Normal range of motion. She exhibits no edema.       Back:  Neurological: She is alert and oriented to person, place, and time.  Strength LE 5/5 and equal bilateral. Sensation intact.  Skin: Skin is warm and dry. She is not diaphoretic.  Psychiatric: She has a normal mood and affect. Her behavior is normal.    ED Course  Procedures (including critical care time) Labs Review Labs Reviewed - No data to display Imaging Review No results  found.  EKG Interpretation   None       MDM   1. Chronic back pain    Pt presenting with acute on chronic back pain. No new injury or trauma. Ran out of her pain medication, has f/u with pain clinic next week. She appears in NAD, hypertensive in triage 173/113, decreased to 163/94 after pain control. Pt reports great improvement of her pain after dilaudid and valium. No red flags concerning patient's back pain. No s/s of central cord compression or cauda equina. Lower extremities are neurovascularly intact and patient is ambulating without difficulty. She is stable for discharge. Aware she cannot be d/c with prescription for narcotic medications as she has a contract with pain clinic. Return precautions given. Patient states understanding of treatment care plan and is agreeable.     Illene Labrador, PA-C 06/18/13 [phone]

## 2013-06-17 NOTE — ED Notes (Signed)
Pt reports chronic back pain that has increased over the last 3-4 days with shooting pain to bilateral legs. Pt states that her PCP could not see her until Friday, but the pain is unbearable and she could not wait that long. Pt a&o x4, tearful in triage.

## 2013-06-18 NOTE — ED Notes (Signed)
Pt able to ambulate steadily with cane

## 2013-06-18 NOTE — ED Provider Notes (Signed)
Medical screening examination/treatment/procedure(s) were performed by non-physician practitioner and as supervising physician I was immediately available for consultation/collaboration.    Teressa Lower, MD 06/18/13 [phone]

## 2013-06-18 NOTE — Discharge Instructions (Signed)
Follow up with the pain clinic as scheduled. Rest your back, avoid heavy lifting or hard physical activity.  Chronic Back Pain  When back pain lasts longer than 3 months, it is called chronic back pain.People with chronic back pain often go through certain periods that are more intense (flare-ups).  CAUSES Chronic back pain can be caused by wear and tear (degeneration) on different structures in your back. These structures include:  The bones of your spine (vertebrae) and the joints surrounding your spinal cord and nerve roots (facets).  The strong, fibrous tissues that connect your vertebrae (ligaments). Degeneration of these structures may result in pressure on your nerves. This can lead to constant pain. HOME CARE INSTRUCTIONS  Avoid bending, heavy lifting, prolonged sitting, and activities which make the problem worse.  Take brief periods of rest throughout the day to reduce your pain. Lying down or standing usually is better than sitting while you are resting.  Take over-the-counter or prescription medicines only as directed by your caregiver. SEEK IMMEDIATE MEDICAL CARE IF:   You have weakness or numbness in one of your legs or feet.  You have trouble controlling your bladder or bowels.  You have nausea, vomiting, abdominal pain, shortness of breath, or fainting. Document Released: 06/18/2004 Document Revised: 08/03/2011 Document Reviewed: 04/25/2011 [facility] Patient Information 2014 [place], [place].  Back Pain, Adult Low back pain is very common. About 1 in 5 people have back pain.The cause of low back pain is rarely dangerous. The pain often gets better over time.About half of people with a sudden onset of back pain feel better in just 2 weeks. About 8 in 10 people feel better by 6 weeks.  CAUSES Some common causes of back pain include:  Strain of the muscles or ligaments supporting the spine.  Wear and tear (degeneration) of the spinal discs.  Arthritis.  Direct  injury to the back. DIAGNOSIS Most of the time, the direct cause of low back pain is not known.However, back pain can be treated effectively even when the exact cause of the pain is unknown.Answering your caregiver's questions about your overall health and symptoms is one of the most accurate ways to make sure the cause of your pain is not dangerous. If your caregiver needs more information, he or she may order lab work or imaging tests (X-rays or MRIs).However, even if imaging tests show changes in your back, this usually does not require surgery. HOME CARE INSTRUCTIONS For many people, back pain returns.Since low back pain is rarely dangerous, it is often a condition that people can learn to [facility] their own.   Remain active. It is stressful on the back to sit or stand in one place. Do not sit, drive, or stand in one place for more than 30 minutes at a time. Take short walks on level surfaces as soon as pain allows.Try to increase the length of time you walk each day.  Do not stay in bed.Resting more than 1 or 2 days can delay your recovery.  Do not avoid exercise or work.Your body is made to move.It is not dangerous to be active, even though your back may hurt.Your back will likely heal faster if you return to being active before your pain is gone.  Pay attention to your body when you bend and lift. Many people have less discomfortwhen lifting if they bend their knees, keep the load close to their bodies,and avoid twisting. Often, the most comfortable positions are those that put less stress on your recovering  back.  Find a comfortable position to sleep. Use a firm mattress and lie on your side with your knees slightly bent. If you lie on your back, put a pillow under your knees.  Only take over-the-counter or prescription medicines as directed by your caregiver. Over-the-counter medicines to reduce pain and inflammation are often the most helpful.Your caregiver may prescribe  muscle relaxant drugs.These medicines help dull your pain so you can more quickly return to your normal activities and healthy exercise.  Put ice on the injured area.  Put ice in a plastic bag.  Place a towel between your skin and the bag.  Leave the ice on for 15-20 minutes, 03-04 times a day for the first 2 to 3 days. After that, ice and heat may be alternated to reduce pain and spasms.  Ask your caregiver about trying back exercises and gentle massage. This may be of some benefit.  Avoid feeling anxious or stressed.Stress increases muscle tension and can worsen back pain.It is important to recognize when you are anxious or stressed and learn ways to manage it.Exercise is a great option. SEEK MEDICAL CARE IF:  You have pain that is not relieved with rest or medicine.  You have pain that does not improve in 1 week.  You have new symptoms.  You are generally not feeling well. SEEK IMMEDIATE MEDICAL CARE IF:   You have pain that radiates from your back into your legs.  You develop new bowel or bladder control problems.  You have unusual weakness or numbness in your arms or legs.  You develop nausea or vomiting.  You develop abdominal pain.  You feel faint. Document Released: 05/11/2005 Document Revised: 11/10/2011 Document Reviewed: 09/29/2010 [facility] Patient Information 2014 [place], [place].

## 2013-07-06 ENCOUNTER — Other Ambulatory Visit: Payer: Self-pay

## 2013-07-06 DIAGNOSIS — Z1231 Encounter for screening mammogram for malignant neoplasm of breast: Secondary | ICD-10-CM

## 2013-07-20 ENCOUNTER — Ambulatory Visit: Payer: Medicare Other

## 2013-08-03 ENCOUNTER — Ambulatory Visit
Admission: RE | Admit: 2013-08-03 | Discharge: 2013-08-03 | Disposition: A | Discharge status: Home / Self Care | Payer: Medicare Other | Source: Ambulatory Visit

## 2013-08-03 DIAGNOSIS — Z1231 Encounter for screening mammogram for malignant neoplasm of breast: Secondary | ICD-10-CM

## 2014-02-23 ENCOUNTER — Emergency Department (HOSPITAL_COMMUNITY)
Admission: EM | Admit: 2014-02-23 | Discharge: 2014-02-24 | Disposition: A | Discharge status: Home / Self Care | Payer: Medicare Other | Attending: Emergency Medicine | Admitting: Emergency Medicine

## 2014-02-23 ENCOUNTER — Encounter (HOSPITAL_COMMUNITY): Payer: Self-pay | Admitting: Emergency Medicine

## 2014-02-23 DIAGNOSIS — S39012A Strain of muscle, fascia and tendon of lower back, initial encounter: Secondary | ICD-10-CM | POA: Insufficient documentation

## 2014-02-23 DIAGNOSIS — Z880 Allergy status to penicillin: Secondary | ICD-10-CM | POA: Diagnosis not present

## 2014-02-23 DIAGNOSIS — Z8669 Personal history of other diseases of the nervous system and sense organs: Secondary | ICD-10-CM | POA: Insufficient documentation

## 2014-02-23 DIAGNOSIS — X58XXXA Exposure to other specified factors, initial encounter: Secondary | ICD-10-CM | POA: Diagnosis not present

## 2014-02-23 DIAGNOSIS — E669 Obesity, unspecified: Secondary | ICD-10-CM | POA: Diagnosis not present

## 2014-02-23 DIAGNOSIS — Z8742 Personal history of other diseases of the female genital tract: Secondary | ICD-10-CM | POA: Insufficient documentation

## 2014-02-23 DIAGNOSIS — Z79899 Other long term (current) drug therapy: Secondary | ICD-10-CM | POA: Insufficient documentation

## 2014-02-23 DIAGNOSIS — M1990 Unspecified osteoarthritis, unspecified site: Secondary | ICD-10-CM | POA: Insufficient documentation

## 2014-02-23 DIAGNOSIS — Y929 Unspecified place or not applicable: Secondary | ICD-10-CM | POA: Insufficient documentation

## 2014-02-23 DIAGNOSIS — Y939 Activity, unspecified: Secondary | ICD-10-CM | POA: Insufficient documentation

## 2014-02-23 DIAGNOSIS — Z720 Tobacco use: Secondary | ICD-10-CM | POA: Insufficient documentation

## 2014-02-23 DIAGNOSIS — G40909 Epilepsy, unspecified, not intractable, without status epilepticus: Secondary | ICD-10-CM | POA: Diagnosis not present

## 2014-02-23 DIAGNOSIS — I10 Essential (primary) hypertension: Secondary | ICD-10-CM | POA: Diagnosis not present

## 2014-02-23 DIAGNOSIS — M545 Low back pain: Secondary | ICD-10-CM | POA: Diagnosis present

## 2014-02-23 DIAGNOSIS — G8929 Other chronic pain: Secondary | ICD-10-CM | POA: Diagnosis not present

## 2014-02-23 MED ORDER — KETOROLAC TROMETHAMINE 60 MG/2ML IM SOLN
60.0000 mg | Freq: Once | INTRAMUSCULAR | Status: AC
  Administered 2014-02-24: 60 mg via INTRAMUSCULAR
  Filled 2014-02-23: qty 2

## 2014-02-23 MED ORDER — HYDROMORPHONE HCL 2 MG/ML IJ SOLN
2.0000 mg | Freq: Once | INTRAMUSCULAR | Status: AC
  Administered 2014-02-24: 2 mg via INTRAMUSCULAR
  Filled 2014-02-23: qty 1

## 2014-02-23 NOTE — ED Provider Notes (Signed)
CSN: [id number]     Arrival date & time 02/23/14  2336 History   First MD Initiated Contact with Patient 02/23/14 2339     Chief Complaint  Patient presents with  . Back Pain     (Consider location/radiation/quality/duration/timing/severity/associated sxs/prior Treatment) Patient is a 48 y.o. female presenting with back pain.  Back Pain Associated symptoms: no abdominal pain, no dysuria, no fever, no headaches, no numbness and no weakness    Patient with a history of chronic back pain presents with 2 days of worsening left-sided low back pain. She states the pain started upon waking 2 days ago. The pain is and slow and progressive. It is worse with movement. Denies any focal weakness or numbness. She's had no urinary or bowel incontinence. She denies any fevers or chills. She has no abdominal pain, nausea or vomiting. No known trauma or heavy lifting. Past Medical History  Diagnosis Date  . Hypertension   . Seizures   . Arthritis   . Chronic back pain   . Single stillborn delivery outcome   . Fibroids   . Carpal tunnel syndrome     chronoic muscle spasms  & back pain  . Smoker    Past Surgical History  Procedure Laterality Date  . Tubal ligation    . Abdominal hysterectomy     Family History  Problem Relation Age of Onset  . Hypertension Mother   . Hypertension Father   . Diabetes Mellitus II Father   . CAD Other    History  Substance Use Topics  . Smoking status: Current Every Day Smoker -- 0.50 packs/day for 30 years    Types: Cigarettes  . Smokeless tobacco: Never Used  . Alcohol Use: No   OB History   Grav Para Term Preterm Abortions TAB SAB Ect Mult Living                 Review of Systems  Constitutional: Negative for fever and chills.  Gastrointestinal: Negative for nausea, vomiting and abdominal pain.  Genitourinary: Negative for dysuria and flank pain.  Musculoskeletal: Positive for back pain and myalgias. Negative for neck pain and neck stiffness.   Skin: Negative for rash and wound.  Neurological: Negative for dizziness, weakness, light-headedness, numbness and headaches.  All other systems reviewed and are negative.     Allergies  Aspirin and Penicillins  Home Medications   Prior to Admission medications   Medication Sig Start Date End Date Taking? Authorizing Provider  carisoprodol (SOMA) 350 MG tablet Take 350 mg by mouth 3 (three) times daily as needed for muscle spasms.    Historical Provider, MD  HYDROcodone-acetaminophen (NORCO) 10-325 MG per tablet Take 1 tablet by mouth every 6 (six) hours as needed for pain.    Historical Provider, MD  ibuprofen (ADVIL,MOTRIN) 200 MG tablet Take 200 mg by mouth every 6 (six) hours as needed for pain.    Historical Provider, MD  levETIRAcetam (KEPPRA) 500 MG tablet Take 1 tablet (500 mg total) by mouth 2 (two) times daily. 07/27/12   Reyne Dumas, MD  valsartan-hydrochlorothiazide (DIOVAN-HCT) 320-12.5 MG per tablet Take 1 tablet by mouth daily.    Historical Provider, MD   BP 192/102  Pulse 85  Temp(Src) 98.3 F (36.8 C) (Oral)  Resp 18  Ht 5\' 5"  (1.651 m)  Wt 233 lb (105.688 kg)  BMI 38.77 kg/m2  SpO2 95% Physical Exam  Nursing note and vitals reviewed. Constitutional: She is oriented to person, place, and time. She appears  well-developed and well-nourished. No distress.  Obese  HENT:  Head: Normocephalic and atraumatic.  Mouth/Throat: Oropharynx is clear and moist.  Eyes: EOM are normal. Pupils are equal, round, and reactive to light.  Neck: Normal range of motion. Neck supple.  Cardiovascular: Normal rate and regular rhythm.   Pulmonary/Chest: Effort normal and breath sounds normal. No respiratory distress. She has no wheezes. She has no rales.  Abdominal: Soft. Bowel sounds are normal. She exhibits no distension and no mass. There is no tenderness. There is no rebound and no guarding.  Musculoskeletal: Normal range of motion. She exhibits tenderness. She exhibits no edema.   To palpation in the left paraspinal lumbar region. No flank tenderness. No midline thoracic or lumbar tenderness.  Neurological: She is alert and oriented to person, place, and time.  Patient is alert and oriented x3 with clear, goal oriented speech. Patient has 5/5 motor in all extremities. Sensation is intact to light touch.   Skin: Skin is warm and dry. No rash noted. No erythema.  Psychiatric: She has a normal mood and affect. Her behavior is normal.    ED Course  Procedures (including critical care time) Labs Review Labs Reviewed  URINALYSIS, ROUTINE W REFLEX MICROSCOPIC    Imaging Review No results found.   EKG Interpretation None      MDM   Final diagnoses:  None   No red flag signs or symptoms. Patient with a normal neurologic exam. Ice followup with her primary Dr. Return precautions given.     Julianne Rice, MD 02/24/14 [phone]

## 2014-02-23 NOTE — ED Notes (Signed)
Pt presents with left side back pain. Pt denies any recent trauma or fall. Pt has a hx of back pain, worsened last night and is now painful with any type of movement. Pt reported to EMS that she tried to take some hydrocodone with no relief. Per EMS, pt has referred pain in her left shoulder when pushing on her right lower quadrant.

## 2014-02-23 NOTE — ED Notes (Signed)
Bed: WA09 Expected date: 02/23/14 Expected time: 11:25 PM Means of arrival: Ambulance Comments: 48 yo F  Back pain

## 2014-02-24 DIAGNOSIS — S39012A Strain of muscle, fascia and tendon of lower back, initial encounter: Secondary | ICD-10-CM | POA: Diagnosis not present

## 2014-02-24 LAB — URINALYSIS, ROUTINE W REFLEX MICROSCOPIC
BILIRUBIN URINE: NEGATIVE
Glucose, UA: NEGATIVE mg/dL
HGB URINE DIPSTICK: NEGATIVE
Ketones, ur: NEGATIVE mg/dL
Leukocytes, UA: NEGATIVE
Nitrite: NEGATIVE
PROTEIN: NEGATIVE mg/dL
Specific Gravity, Urine: 1.022 (ref 1.005–1.030)
UROBILINOGEN UA: 0.2 mg/dL (ref 0.0–1.0)
pH: 6 (ref 5.0–8.0)

## 2014-02-24 MED ORDER — DIAZEPAM 5 MG PO TABS: 5.0000 mg | ORAL_TABLET | Freq: Three times a day (TID) | ORAL | Status: DC | PRN

## 2014-02-24 NOTE — Discharge Instructions (Signed)

## 2014-09-20 ENCOUNTER — Other Ambulatory Visit: Payer: Self-pay

## 2014-09-20 DIAGNOSIS — Z1231 Encounter for screening mammogram for malignant neoplasm of breast: Secondary | ICD-10-CM

## 2014-10-03 ENCOUNTER — Ambulatory Visit
Admission: RE | Admit: 2014-10-03 | Discharge: 2014-10-03 | Disposition: A | Discharge status: Home / Self Care | Payer: Medicare Other | Source: Ambulatory Visit

## 2014-10-03 DIAGNOSIS — Z1231 Encounter for screening mammogram for malignant neoplasm of breast: Secondary | ICD-10-CM

## 2015-01-31 ENCOUNTER — Other Ambulatory Visit: Payer: Self-pay | Admitting: Pain Medicine

## 2015-01-31 DIAGNOSIS — M545 Low back pain: Secondary | ICD-10-CM

## 2015-02-21 ENCOUNTER — Ambulatory Visit
Admission: RE | Admit: 2015-02-21 | Discharge: 2015-02-21 | Disposition: A | Discharge status: Home / Self Care | Payer: Medicare Other | Source: Ambulatory Visit | Attending: Pain Medicine | Admitting: Pain Medicine

## 2015-02-21 DIAGNOSIS — M545 Low back pain: Secondary | ICD-10-CM

## 2015-09-06 ENCOUNTER — Other Ambulatory Visit: Payer: Self-pay

## 2015-09-06 DIAGNOSIS — Z1231 Encounter for screening mammogram for malignant neoplasm of breast: Secondary | ICD-10-CM

## 2015-10-07 ENCOUNTER — Ambulatory Visit
Admission: RE | Admit: 2015-10-07 | Discharge: 2015-10-07 | Disposition: A | Discharge status: Home / Self Care | Payer: Medicare Other | Source: Ambulatory Visit

## 2015-10-07 DIAGNOSIS — Z1231 Encounter for screening mammogram for malignant neoplasm of breast: Secondary | ICD-10-CM

## 2015-11-21 ENCOUNTER — Encounter (HOSPITAL_COMMUNITY): Payer: Self-pay | Admitting: *Deleted

## 2015-11-21 ENCOUNTER — Emergency Department (HOSPITAL_COMMUNITY)
Admission: EM | Admit: 2015-11-21 | Discharge: 2015-11-21 | Disposition: A | Discharge status: Home / Self Care | Payer: Medicare Other | Attending: Emergency Medicine | Admitting: Emergency Medicine

## 2015-11-21 DIAGNOSIS — Z79899 Other long term (current) drug therapy: Secondary | ICD-10-CM | POA: Diagnosis not present

## 2015-11-21 DIAGNOSIS — Z791 Long term (current) use of non-steroidal anti-inflammatories (NSAID): Secondary | ICD-10-CM | POA: Diagnosis not present

## 2015-11-21 DIAGNOSIS — M1990 Unspecified osteoarthritis, unspecified site: Secondary | ICD-10-CM | POA: Diagnosis not present

## 2015-11-21 DIAGNOSIS — I10 Essential (primary) hypertension: Secondary | ICD-10-CM | POA: Diagnosis not present

## 2015-11-21 DIAGNOSIS — F17210 Nicotine dependence, cigarettes, uncomplicated: Secondary | ICD-10-CM | POA: Insufficient documentation

## 2015-11-21 DIAGNOSIS — N390 Urinary tract infection, site not specified: Secondary | ICD-10-CM

## 2015-11-21 DIAGNOSIS — R197 Diarrhea, unspecified: Secondary | ICD-10-CM | POA: Diagnosis not present

## 2015-11-21 DIAGNOSIS — Z8669 Personal history of other diseases of the nervous system and sense organs: Secondary | ICD-10-CM | POA: Insufficient documentation

## 2015-11-21 DIAGNOSIS — R112 Nausea with vomiting, unspecified: Secondary | ICD-10-CM | POA: Diagnosis not present

## 2015-11-21 DIAGNOSIS — R531 Weakness: Secondary | ICD-10-CM | POA: Diagnosis present

## 2015-11-21 DIAGNOSIS — R110 Nausea: Secondary | ICD-10-CM

## 2015-11-21 LAB — COMPREHENSIVE METABOLIC PANEL
ALT: 21 U/L (ref 14–54)
ANION GAP: 9 (ref 5–15)
AST: 15 U/L (ref 15–41)
Albumin: 3.5 g/dL (ref 3.5–5.0)
Alkaline Phosphatase: 77 U/L (ref 38–126)
BILIRUBIN TOTAL: 0.7 mg/dL (ref 0.3–1.2)
BUN: 13 mg/dL (ref 6–20)
CHLORIDE: 107 mmol/L (ref 101–111)
CO2: 24 mmol/L (ref 22–32)
Calcium: 8.9 mg/dL (ref 8.9–10.3)
Creatinine, Ser: 0.84 mg/dL (ref 0.44–1.00)
GFR calc Af Amer: >60 mL/min (ref >60)
Glucose, Bld: 112 mg/dL — ABNORMAL HIGH (ref 65–99)
POTASSIUM: 3.2 mmol/L — AB (ref 3.5–5.1)
Sodium: 140 mmol/L (ref 135–145)
TOTAL PROTEIN: 7.6 g/dL (ref 6.5–8.1)

## 2015-11-21 LAB — URINALYSIS, ROUTINE W REFLEX MICROSCOPIC
Glucose, UA: NEGATIVE mg/dL
HGB URINE DIPSTICK: NEGATIVE
KETONES UR: NEGATIVE mg/dL
NITRITE: NEGATIVE
PROTEIN: NEGATIVE mg/dL
Specific Gravity, Urine: 1.028 (ref 1.005–1.030)
pH: 5.5 (ref 5.0–8.0)

## 2015-11-21 LAB — URINE MICROSCOPIC-ADD ON

## 2015-11-21 LAB — CBC WITH DIFFERENTIAL/PLATELET
BASOS ABS: 0 10*3/uL (ref 0.0–0.1)
BASOS PCT: 0 %
EOS PCT: 0 %
Eosinophils Absolute: 0 10*3/uL (ref 0.0–0.7)
HEMATOCRIT: 36.7 % (ref 36.0–46.0)
Hemoglobin: 12.8 g/dL (ref 12.0–15.0)
LYMPHS PCT: 33 %
Lymphs Abs: 2.6 10*3/uL (ref 0.7–4.0)
MCH: 29 pg (ref 26.0–34.0)
MCHC: 34.9 g/dL (ref 30.0–36.0)
MCV: 83.2 fL (ref 78.0–100.0)
MONOS PCT: 6 %
Monocytes Absolute: 0.5 10*3/uL (ref 0.1–1.0)
NEUTROS PCT: 61 %
Neutro Abs: 4.8 10*3/uL (ref 1.7–7.7)
PLATELETS: 362 10*3/uL (ref 150–400)
RBC: 4.41 MIL/uL (ref 3.87–5.11)
RDW: 13.7 % (ref 11.5–15.5)
WBC: 7.9 10*3/uL (ref 4.0–10.5)

## 2015-11-21 LAB — LIPASE, BLOOD: Lipase: 20 U/L (ref 11–51)

## 2015-11-21 MED ORDER — SCOPOLAMINE HBR 0.4 MG/ML IJ SOLN
0.4000 mg | Freq: Once | INTRAMUSCULAR | Status: DC
  Filled 2015-11-21: qty 1

## 2015-11-21 MED ORDER — CIPROFLOXACIN HCL 500 MG PO TABS
500.0000 mg | ORAL_TABLET | Freq: Once | ORAL | Status: AC
  Administered 2015-11-21: 500 mg via ORAL
  Filled 2015-11-21: qty 1

## 2015-11-21 MED ORDER — PROMETHAZINE HCL 25 MG/ML IJ SOLN
12.5000 mg | Freq: Once | INTRAMUSCULAR | Status: AC
  Administered 2015-11-21: 12.5 mg via INTRAVENOUS
  Filled 2015-11-21: qty 1

## 2015-11-21 MED ORDER — ONDANSETRON HCL 4 MG/2ML IJ SOLN
4.0000 mg | Freq: Once | INTRAMUSCULAR | Status: AC
  Administered 2015-11-21: 4 mg via INTRAVENOUS
  Filled 2015-11-21: qty 2

## 2015-11-21 MED ORDER — HYDROCODONE-ACETAMINOPHEN 5-325 MG PO TABS: 2.0000 | ORAL_TABLET | ORAL | Status: DC | PRN

## 2015-11-21 MED ORDER — CIPROFLOXACIN HCL 500 MG PO TABS: 500.0000 mg | ORAL_TABLET | Freq: Two times a day (BID) | ORAL | Status: DC

## 2015-11-21 MED ORDER — DIPHENOXYLATE-ATROPINE 2.5-0.025 MG PO TABS
2.0000 | ORAL_TABLET | Freq: Once | ORAL | Status: AC
  Administered 2015-11-21: 2 via ORAL
  Filled 2015-11-21: qty 2

## 2015-11-21 MED ORDER — SODIUM CHLORIDE 0.9 % IV BOLUS (SEPSIS)
2000.0000 mL | Freq: Once | INTRAVENOUS | Status: AC
  Administered 2015-11-21: 2000 mL via INTRAVENOUS

## 2015-11-21 MED ORDER — ONDANSETRON 4 MG PO TBDP: 4.0000 mg | ORAL_TABLET | Freq: Three times a day (TID) | ORAL | Status: DC | PRN

## 2015-11-21 MED ORDER — MORPHINE SULFATE (PF) 4 MG/ML IV SOLN
4.0000 mg | INTRAVENOUS | Status: DC | PRN
  Administered 2015-11-21 (×2): 4 mg via INTRAVENOUS
  Filled 2015-11-21 (×2): qty 1

## 2015-11-21 MED ORDER — SCOPOLAMINE 1 MG/3DAYS TD PT72
1.0000 | MEDICATED_PATCH | TRANSDERMAL | Status: DC
  Administered 2015-11-21: 1.5 mg via TRANSDERMAL
  Filled 2015-11-21: qty 1

## 2015-11-21 NOTE — ED Notes (Signed)
Per EMS, pt felt weak in her extremities when entering pain clinic which led to her lower herself to the groun. Pt denies denies loss of consciousness. Pt states she has had n/v/d for the past 3 days. Pt given 4mg  zofran, 234ml NS en route to hospital. Pt states nausea is better.

## 2015-11-21 NOTE — Discharge Instructions (Signed)

## 2015-11-21 NOTE — ED Provider Notes (Signed)
CSN: [id number]     Arrival date & time 11/21/15  1600 History   First MD Initiated Contact with Patient 11/21/15 1626     Chief Complaint  Patient presents with  . Weakness      HPI  Patient presents for evaluation of weakness and near syncope. States she's had nausea vomiting diarrhea for the last 2-3 days has no abdominal cramping. Was walking into her pain management physicians clinic today for a planned epidural steroid injection. She began to feel lightheaded and tingly in her extremities. She felt near syncopal and was helped to sit by her son who accompanied her. She did not syncope. She did not fall. Given Zofran, and fluids by paramedics and feels better with her nausea was still feels weak and dizzy.  Or chills. No significant abdominal pain. No dysuria. She hematuria. Heme-negative nonbilious emesis. No blood pus or abuse in her stools. No associated illnesses with any family members or contacts. No travel.  Past Medical History  Diagnosis Date  . Hypertension   . Seizures ([place])   . Arthritis   . Chronic back pain   . Single stillborn delivery outcome   . Fibroids   . Carpal tunnel syndrome     chronoic muscle spasms  & back pain  . Smoker    Past Surgical History  Procedure Laterality Date  . Tubal ligation    . Abdominal hysterectomy     Family History  Problem Relation Age of Onset  . Hypertension Mother   . Hypertension Father   . Diabetes Mellitus II Father   . CAD Other    Social History  Substance Use Topics  . Smoking status: Current Every Day Smoker -- 0.50 packs/day for 30 years    Types: Cigarettes  . Smokeless tobacco: Never Used  . Alcohol Use: No   OB History    No data available     Review of Systems  Constitutional: Negative for fever, chills, diaphoresis, appetite change and fatigue.  HENT: Negative for mouth sores, sore throat and trouble swallowing.   Eyes: Negative for visual disturbance.  Respiratory: Negative for cough, chest  tightness, shortness of breath and wheezing.   Cardiovascular: Negative for chest pain.  Gastrointestinal: Positive for nausea, vomiting, abdominal pain and diarrhea. Negative for abdominal distention.  Endocrine: Negative for polydipsia, polyphagia and polyuria.  Genitourinary: Negative for dysuria, frequency and hematuria.  Musculoskeletal: Negative for gait problem.  Skin: Negative for color change, pallor and rash.  Neurological: Negative for dizziness, syncope, light-headedness and headaches.  Hematological: Does not bruise/bleed easily.  Psychiatric/Behavioral: Negative for behavioral problems and confusion.      Allergies  Aspirin and Penicillins  Home Medications   Prior to Admission medications   Medication Sig Start Date End Date Taking? Authorizing Provider  carisoprodol (SOMA) 350 MG tablet Take 350 mg by mouth 3 (three) times daily.    Yes Historical Provider, MD  cyanocobalamin (,VITAMIN B-12,) 1000 MCG/ML injection Inject 1,000 mcg into the muscle every 30 (thirty) days.   Yes Historical Provider, MD  docusate sodium (COLACE) 100 MG capsule Take 100 mg by mouth daily as needed for mild constipation.   Yes Historical Provider, MD  hydrochlorothiazide (HYDRODIURIL) 12.5 MG tablet Take 12.5 mg by mouth daily. 10/09/15  Yes Historical Provider, MD  ibuprofen (ADVIL,MOTRIN) 800 MG tablet Take 800 mg by mouth every 8 (eight) hours as needed for mild pain.   Yes Historical Provider, MD  levETIRAcetam (KEPPRA) 500 MG tablet Take 1  tablet (500 mg total) by mouth 2 (two) times daily. 07/27/12  Yes Reyne Dumas, MD  simvastatin (ZOCOR) 10 MG tablet Take 10 mg by mouth at bedtime. 10/24/15  Yes Historical Provider, MD  valsartan (DIOVAN) 320 MG tablet Take 320 mg by mouth daily.   Yes Historical Provider, MD  ciprofloxacin (CIPRO) 500 MG tablet Take 1 tablet (500 mg total) by mouth every 12 (twelve) hours. 11/21/15   Tanna Furry, MD  HYDROcodone-acetaminophen (NORCO/VICODIN) 5-325 MG tablet  Take 2 tablets by mouth every 4 (four) hours as needed. 11/21/15   Tanna Furry, MD  ondansetron (ZOFRAN ODT) 4 MG disintegrating tablet Take 1 tablet (4 mg total) by mouth every 8 (eight) hours as needed for nausea. 11/21/15   Tanna Furry, MD   BP 149/97 mmHg  Pulse 92  Temp(Src) 98.6 F (37 C) (Oral)  Resp 21  Ht 5\' 5"  (1.651 m)  Wt 232 lb (105.235 kg)  BMI 38.61 kg/m2  SpO2 98% Physical Exam  Constitutional: She is oriented to person, place, and time. She appears well-developed and well-nourished. No distress.  HENT:  Head: Normocephalic.  Eyes: Conjunctivae are normal. Pupils are equal, round, and reactive to light. No scleral icterus.  Neck: Normal range of motion. Neck supple. No thyromegaly present.  Cardiovascular: Normal rate and regular rhythm.  Exam reveals no gallop and no friction rub.   No murmur heard. Pulmonary/Chest: Effort normal and breath sounds normal. No respiratory distress. She has no wheezes. She has no rales.  Abdominal: Soft. Bowel sounds are normal. She exhibits no distension. There is no tenderness. There is no rebound.    Musculoskeletal: Normal range of motion.  Neurological: She is alert and oriented to person, place, and time.  Skin: Skin is warm and dry. No rash noted.  Psychiatric: She has a normal mood and affect. Her behavior is normal.    ED Course  Procedures (including critical care time) Labs Review Labs Reviewed  URINALYSIS, ROUTINE W REFLEX MICROSCOPIC (NOT AT [facility]) - Abnormal; Notable for the following:    Color, Urine AMBER (*)    APPearance CLOUDY (*)    Bilirubin Urine SMALL (*)    Leukocytes, UA MODERATE (*)    All other components within normal limits  COMPREHENSIVE METABOLIC PANEL - Abnormal; Notable for the following:    Potassium 3.2 (*)    Glucose, Bld 112 (*)    All other components within normal limits  URINE MICROSCOPIC-ADD ON - Abnormal; Notable for the following:    Squamous Epithelial / LPF 6-30 (*)    Bacteria, UA  MANY (*)    All other components within normal limits  URINE CULTURE  CBC WITH DIFFERENTIAL/PLATELET  LIPASE, BLOOD    Imaging Review No results found. I have personally reviewed and evaluated these images and lab results as part of my medical decision-making.   EKG Interpretation None      MDM   Final diagnoses:  UTI (lower urinary tract infection)  Nausea    Patient rehydrated.  Given IV antibiotics. . Urine appears infected. Culture. Plan is home, Zofran, 2 day course right recurrent issues supposed to have her refill of that today her pain management physician. Cipro. Primary care doctor.    Tanna Furry, MD 11/21/15 2240

## 2015-11-21 NOTE — ED Notes (Signed)
Made 1st request for urine sample, pt unable to provide one at this time.

## 2015-11-21 NOTE — ED Notes (Signed)
Bed: [facility] Expected date: 11/21/15 Expected time:  Means of arrival:  Comments: EMS-N/V

## 2015-11-23 LAB — URINE CULTURE: Special Requests: NORMAL

## 2016-01-30 ENCOUNTER — Encounter: Payer: Self-pay | Admitting: Gastroenterology

## 2016-03-10 ENCOUNTER — Ambulatory Visit (AMBULATORY_SURGERY_CENTER): Payer: Self-pay

## 2016-03-10 VITALS — Ht 65.0 in | Wt 237.8 lb

## 2016-03-10 DIAGNOSIS — Z1211 Encounter for screening for malignant neoplasm of colon: Secondary | ICD-10-CM

## 2016-03-10 MED ORDER — NA SULFATE-K SULFATE-MG SULF 17.5-3.13-1.6 GM/177ML PO SOLN: ORAL | 0 refills | Status: DC

## 2016-03-10 NOTE — Progress Notes (Signed)
Per pt, no allergies to soy or egg products.Pt not taking any weight loss meds or using  O2 at home. 

## 2016-03-24 ENCOUNTER — Encounter: Payer: Medicare Other | Admitting: Gastroenterology

## 2016-04-14 ENCOUNTER — Encounter: Payer: Self-pay | Admitting: Gastroenterology

## 2016-04-14 ENCOUNTER — Ambulatory Visit (AMBULATORY_SURGERY_CENTER): Payer: Medicare Other | Admitting: Gastroenterology

## 2016-04-14 VITALS — BP 136/69 | HR 61 | Temp 97.1°F | Resp 12 | Ht 65.0 in | Wt 237.0 lb

## 2016-04-14 DIAGNOSIS — Z1211 Encounter for screening for malignant neoplasm of colon: Secondary | ICD-10-CM | POA: Diagnosis not present

## 2016-04-14 DIAGNOSIS — Z1212 Encounter for screening for malignant neoplasm of rectum: Secondary | ICD-10-CM | POA: Diagnosis not present

## 2016-04-14 DIAGNOSIS — K5730 Diverticulosis of large intestine without perforation or abscess without bleeding: Secondary | ICD-10-CM

## 2016-04-14 MED ORDER — SODIUM CHLORIDE 0.9 % IV SOLN: 500.0000 mL | INTRAVENOUS | Status: DC

## 2016-04-14 NOTE — Op Note (Signed)
[place] Patient Name: [name] Procedure Date: 04/14/2016 10:53 AM MRN: [id number] Endoscopist: Milus Banister , MD Age: 50 Referring MD:  Date of Birth: 1966-02-19 Gender: Female Account #: [id number] Procedure:                Colonoscopy Indications:              Screening for colorectal malignant neoplasm Medicines:                Monitored Anesthesia Care Procedure:                Pre-Anesthesia Assessment:                           - Prior to the procedure, a History and Physical                            was performed, and patient medications and                            allergies were reviewed. The patient's tolerance of                            previous anesthesia was also reviewed. The risks                            and benefits of the procedure and the sedation                            options and risks were discussed with the patient.                            All questions were answered, and informed consent                            was obtained. Prior Anticoagulants: The patient has                            taken no previous anticoagulant or antiplatelet                            agents. ASA Grade Assessment: II - A patient with                            mild systemic disease. After reviewing the risks                            and benefits, the patient was deemed in                            satisfactory condition to undergo the procedure.                           After obtaining informed consent, the colonoscope  was passed under direct vision. Throughout the                            procedure, the patient's blood pressure, pulse, and                            oxygen saturations were monitored continuously. The                            Model CF-HQ190L [id number]) scope was introduced                            through the anus and advanced to the the cecum,                            identified by  appendiceal orifice and ileocecal                            valve. The colonoscopy was performed without                            difficulty. The patient tolerated the procedure                            well. The quality of the bowel preparation was                            good. The ileocecal valve, appendiceal orifice, and                            rectum were photographed. Scope In: 10:57:48 AM Scope Out: 11:11:09 AM Scope Withdrawal Time: 0 hours 9 minutes 53 seconds  Total Procedure Duration: 0 hours 13 minutes 21 seconds  Findings:                 Multiple small-mouthed diverticula were found in                            the entire colon.                           The exam was otherwise without abnormality on                            direct and retroflexion views. Complications:            No immediate complications. Estimated blood loss:                            None. Estimated Blood Loss:     Estimated blood loss: none. Impression:               - Diverticulosis in the entire examined colon.                           - The examination was otherwise normal on direct  and retroflexion views.                           - No polyps or cancers Recommendation:           - Patient has a contact number available for                            emergencies. The signs and symptoms of potential                            delayed complications were discussed with the                            patient. Return to normal activities tomorrow.                            Written discharge instructions were provided to the                            patient.                           - Resume previous diet.                           - Continue present medications.                           - Repeat colonoscopy in 10 years for screening                            purposes. There is no need for colon cancer                            screening by any method  (including stool testing)                            prior to then. Milus Banister, MD 04/14/2016 11:13:17 AM This report has been signed electronically.

## 2016-04-14 NOTE — Patient Instructions (Signed)
YOU HAD AN ENDOSCOPIC PROCEDURE TODAY AT THE [facility]:   Refer to the procedure report that was given to you for any specific questions about what was found during the examination.  If the procedure report does not answer your questions, please call your gastroenterologist to clarify.  If you requested that your care partner not be given the details of your procedure findings, then the procedure report has been included in a sealed envelope for you to review at your convenience later.  YOU SHOULD EXPECT: Some feelings of bloating in the abdomen. Passage of more gas than usual.  Walking can help get rid of the air that was put into your GI tract during the procedure and reduce the bloating. If you had a lower endoscopy (such as a colonoscopy or flexible sigmoidoscopy) you may notice spotting of blood in your stool or on the toilet paper. If you underwent a bowel prep for your procedure, you may not have a normal bowel movement for a few days.  Please Note:  You might notice some irritation and congestion in your nose or some drainage.  This is from the oxygen used during your procedure.  There is no need for concern and it should clear up in a day or so.  SYMPTOMS TO REPORT IMMEDIATELY:   Following lower endoscopy (colonoscopy or flexible sigmoidoscopy):  Excessive amounts of blood in the stool  Significant tenderness or worsening of abdominal pains  Swelling of the abdomen that is new, acute  Fever of 100F or higher   For urgent or emergent issues, a gastroenterologist can be reached at any hour by calling [phone]. Please read all handouts given to you by your recovery nurse.   DIET:  We do recommend a small meal at first, but then you may proceed to your regular diet.  Drink plenty of fluids but you should avoid alcoholic beverages for 24 hours.  ACTIVITY:  You should plan to take it easy for the rest of today and you should NOT DRIVE or use heavy machinery until  tomorrow (because of the sedation medicines used during the test).    FOLLOW UP: Our staff will call the number listed on your records the next business day following your procedure to check on you and address any questions or concerns that you may have regarding the information given to you following your procedure. If we do not reach you, we will leave a message.  However, if you are feeling well and you are not experiencing any problems, there is no need to return our call.  We will assume that you have returned to your regular daily activities without incident.  If any biopsies were taken you will be contacted by phone or by letter within the next 1-3 weeks.  Please call us at [phone] if you have not heard about the biopsies in 3 weeks.    SIGNATURES/CONFIDENTIALITY: You and/or your care partner have signed paperwork which will be entered into your electronic medical record.  These signatures attest to the fact that that the information above on your After Visit Summary has been reviewed and is understood.  Full responsibility of the confidentiality of this discharge information lies with you and/or your care-partner.  Thank you for letting us take care of your healthcare needs today. 

## 2016-04-14 NOTE — Progress Notes (Signed)
Patient awakening,vss,report to rn 

## 2016-04-15 ENCOUNTER — Telehealth: Payer: Self-pay

## 2016-04-15 ENCOUNTER — Telehealth: Payer: Self-pay | Admitting: *Deleted

## 2016-04-15 NOTE — Telephone Encounter (Signed)
  Follow up Call-  Call back number 04/14/2016  Post procedure Call Back phone  # [phone]  Permission to leave phone message Yes  Some recent data might be hidden     Patient questions:  Left message to call us if necessary.

## 2016-04-15 NOTE — Telephone Encounter (Signed)
  Follow up Call-  Call back number 04/14/2016  Post procedure Call Back phone  # [phone]  Permission to leave phone message Yes  Some recent data might be hidden     Patient questions:  Do you have a fever, pain , or abdominal swelling? No. Pain Score  0 *  Have you tolerated food without any problems? Yes.    Have you been able to return to your normal activities? Yes.    Do you have any questions about your discharge instructions: Diet   No. Medications  No. Follow up visit  No.  Do you have questions or concerns about your Care? No.  Actions: * If pain score is 4 or above: No action needed, pain <4.

## 2016-08-27 ENCOUNTER — Other Ambulatory Visit: Payer: Self-pay | Admitting: Pain Medicine

## 2016-08-27 DIAGNOSIS — M545 Low back pain, unspecified: Secondary | ICD-10-CM

## 2016-09-11 ENCOUNTER — Inpatient Hospital Stay
Admission: RE | Admit: 2016-09-11 | Discharge: 2016-09-11 | Disposition: A | Discharge status: Home / Self Care | Payer: Medicare Other | Source: Ambulatory Visit | Attending: Pain Medicine | Admitting: Pain Medicine

## 2016-09-30 ENCOUNTER — Ambulatory Visit
Admission: RE | Admit: 2016-09-30 | Discharge: 2016-09-30 | Disposition: A | Discharge status: Home / Self Care | Payer: Medicare Other | Source: Ambulatory Visit | Attending: Pain Medicine | Admitting: Pain Medicine

## 2016-09-30 DIAGNOSIS — M545 Low back pain, unspecified: Secondary | ICD-10-CM

## 2016-11-12 ENCOUNTER — Ambulatory Visit
Admission: RE | Admit: 2016-11-12 | Discharge: 2016-11-12 | Disposition: A | Discharge status: Home / Self Care | Payer: Medicare Other | Source: Ambulatory Visit | Attending: Physician Assistant | Admitting: Physician Assistant

## 2016-11-12 ENCOUNTER — Other Ambulatory Visit: Payer: Self-pay | Admitting: Physician Assistant

## 2016-11-12 DIAGNOSIS — G894 Chronic pain syndrome: Secondary | ICD-10-CM

## 2016-11-16 ENCOUNTER — Other Ambulatory Visit: Payer: Self-pay | Admitting: Internal Medicine

## 2016-11-16 DIAGNOSIS — Z1231 Encounter for screening mammogram for malignant neoplasm of breast: Secondary | ICD-10-CM

## 2016-11-30 ENCOUNTER — Ambulatory Visit
Admission: RE | Admit: 2016-11-30 | Discharge: 2016-11-30 | Disposition: A | Discharge status: Home / Self Care | Payer: Medicare Other | Source: Ambulatory Visit | Attending: Internal Medicine | Admitting: Internal Medicine

## 2016-11-30 DIAGNOSIS — Z1231 Encounter for screening mammogram for malignant neoplasm of breast: Secondary | ICD-10-CM

## 2016-12-25 ENCOUNTER — Emergency Department (HOSPITAL_COMMUNITY)
Admission: EM | Admit: 2016-12-25 | Discharge: 2016-12-25 | Disposition: A | Discharge status: Home / Self Care | Payer: Medicare Other | Attending: Emergency Medicine | Admitting: Emergency Medicine

## 2016-12-25 ENCOUNTER — Encounter (HOSPITAL_COMMUNITY): Payer: Self-pay

## 2016-12-25 DIAGNOSIS — Z79899 Other long term (current) drug therapy: Secondary | ICD-10-CM | POA: Insufficient documentation

## 2016-12-25 DIAGNOSIS — I10 Essential (primary) hypertension: Secondary | ICD-10-CM | POA: Diagnosis not present

## 2016-12-25 DIAGNOSIS — M5442 Lumbago with sciatica, left side: Secondary | ICD-10-CM | POA: Diagnosis not present

## 2016-12-25 DIAGNOSIS — M545 Low back pain: Secondary | ICD-10-CM | POA: Diagnosis present

## 2016-12-25 DIAGNOSIS — G8929 Other chronic pain: Secondary | ICD-10-CM

## 2016-12-25 MED ORDER — METHOCARBAMOL 500 MG PO TABS: 500.0000 mg | ORAL_TABLET | Freq: Two times a day (BID) | ORAL | 0 refills | Status: AC

## 2016-12-25 MED ORDER — METHOCARBAMOL 500 MG PO TABS
750.0000 mg | ORAL_TABLET | Freq: Once | ORAL | Status: AC
  Administered 2016-12-25: 750 mg via ORAL
  Filled 2016-12-25: qty 2

## 2016-12-25 MED ORDER — PREDNISONE 20 MG PO TABS
60.0000 mg | ORAL_TABLET | Freq: Once | ORAL | Status: AC
  Administered 2016-12-25: 60 mg via ORAL
  Filled 2016-12-25: qty 3

## 2016-12-25 MED ORDER — ACETAMINOPHEN 500 MG PO TABS
1000.0000 mg | ORAL_TABLET | Freq: Once | ORAL | Status: AC
Start: 2016-12-25 — End: 2016-12-25
  Administered 2016-12-25: 1000 mg via ORAL
  Filled 2016-12-25: qty 2

## 2016-12-25 MED ORDER — PREDNISONE 10 MG PO TABS: 40.0000 mg | ORAL_TABLET | Freq: Every day | ORAL | 0 refills | Status: AC

## 2016-12-25 MED ORDER — HYDROMORPHONE HCL 1 MG/ML IJ SOLN
1.0000 mg | Freq: Once | INTRAMUSCULAR | Status: AC
  Administered 2016-12-25: 1 mg via INTRAMUSCULAR
  Filled 2016-12-25: qty 1

## 2016-12-25 MED ORDER — LIDOCAINE 5 % EX PTCH: 1.0000 | MEDICATED_PATCH | CUTANEOUS | 0 refills | Status: AC

## 2016-12-25 NOTE — ED Notes (Signed)
[name] of patient [phone]

## 2016-12-25 NOTE — ED Provider Notes (Signed)
[facility] DEPT Provider Note   CSN: [id number] Arrival date & time: 12/25/16  1442     History   Chief Complaint Chief Complaint  Patient presents with  . Fall  . Leg Pain    HPI [name] is a 51 y.o. female with h/o chronic back pain on vicodin and soma, chronic pain syndrome, obesity presents to ED for evaluation of acute on chronic low back pain onset this AM after fall in bath tub. Low back pain radiates to left leg. States she was getting out of tub and lost her balance, she braced herself and slid down on her left side on the side of the tub. Denies fall to ground, head trauma, LOC, headache. No LE numbness, weakness, saddle anesthesia, b/b incontinence. Pain today is typical of her chronic back pain but more severe since fall.  Pt has care with pain clinic but her PCP prescribes her monthly vicodin. She ran out of vicodin today, has appointment with PCP for refill in 2 days.   HPI  Past Medical History:  Diagnosis Date  . Arthritis   . Carpal tunnel syndrome    chronoic muscle spasms  & back pain  . Chronic back pain   . Fibroids    uterine  . Hypertension   . Seizures ([place])    last seizure 4-5 years ago/on keppra  . Single stillborn delivery outcome   . Smoker     Patient Active Problem List   Diagnosis Date Noted  . Other convulsions 07/26/2012  . Seizures ([place]) 07/26/2012  . Hypertension 07/26/2012  . Arthritis 07/26/2012  . Tobacco abuse 07/26/2012  . Anemia 07/26/2012    Past Surgical History:  Procedure Laterality Date  . ABDOMINAL HYSTERECTOMY    . CESAREAN SECTION     1 time  . MYOMECTOMY    . TUBAL LIGATION      OB History    No data available       Home Medications    Prior to Admission medications   Medication Sig Start Date End Date Taking? Authorizing Provider  carisoprodol (SOMA) 350 MG tablet Take 350 mg by mouth 3 (three) times daily.     [provider]  cyanocobalamin (,VITAMIN B-12,) 1000 MCG/ML injection  Inject 1,000 mcg into the muscle every 30 (thirty) days.    [provider]  docusate sodium (COLACE) 100 MG capsule Take 100 mg by mouth daily as needed for mild constipation.    [provider]  hydrochlorothiazide (HYDRODIURIL) 12.5 MG tablet Take 12.5 mg by mouth daily. 10/09/15   [provider]  HYDROcodone-acetaminophen (NORCO) 7.5-325 MG tablet Take 1 tablet by mouth 3 (three) times daily as needed for moderate pain.    [provider]  ibuprofen (ADVIL,MOTRIN) 800 MG tablet Take 800 mg by mouth every 8 (eight) hours as needed for mild pain.    [provider]  levETIRAcetam (KEPPRA) 500 MG tablet Take 1 tablet (500 mg total) by mouth 2 (two) times daily. 07/27/12   Reyne Dumas, MD  lidocaine (LIDODERM) 5 % Place 1 patch onto the skin daily. Apply to lower back. Change every 24 hours. 12/25/16 12/30/16  Kinnie Feil, PA-C  methocarbamol (ROBAXIN) 500 MG tablet Take 1 tablet (500 mg total) by mouth 2 (two) times daily. 12/25/16 12/30/16  Kinnie Feil, PA-C  ondansetron (ZOFRAN ODT) 4 MG disintegrating tablet Take 1 tablet (4 mg total) by mouth every 8 (eight) hours as needed for nausea. 11/21/15  Tanna Furry, MD  pantoprazole (PROTONIX) 40 MG tablet Take 40 mg by mouth daily.    [provider]  predniSONE (DELTASONE) 10 MG tablet Take 4 tablets (40 mg total) by mouth daily. 12/25/16 12/30/16  Kinnie Feil, PA-C  simvastatin (ZOCOR) 10 MG tablet Take 10 mg by mouth at bedtime. 10/24/15   [provider]  valsartan (DIOVAN) 320 MG tablet Take 320 mg by mouth daily.    [provider]    Family History Family History  Problem Relation Age of Onset  . Hypertension Mother   . Hypertension Father   . Diabetes Mellitus II Father   . Diabetes Father   . CAD Other   . Breast cancer Sister   . Hypertension Brother   . Hypertension Sister   . Hypertension Sister   . Hypertension Sister     Social History Social  History  Substance Use Topics  . Smoking status: Current Every Day Smoker    Packs/day: 0.25    Years: 30.00    Types: Cigarettes  . Smokeless tobacco: Never Used  . Alcohol use No     Allergies   Aspirin and Penicillins   Review of Systems Review of Systems  Constitutional: Negative for fever.  Respiratory: Negative for cough and shortness of breath.   Cardiovascular: Negative for chest pain.  Gastrointestinal: Negative for abdominal pain, constipation, diarrhea, nausea and vomiting.  Genitourinary: Negative for difficulty urinating, dysuria, frequency and hematuria.  Musculoskeletal: Positive for back pain and myalgias.  Skin: Negative for wound.  Neurological: Negative for weakness, light-headedness, numbness and headaches.     Physical Exam Updated Vital Signs BP (!) 150/65 (BP Location: Left Arm)   Pulse 83   Temp 98.1 F (36.7 C) (Oral)   Resp 14   Ht 5\' 5"  (1.651 m)   Wt 105.7 kg (233 lb)   SpO2 97%   BMI 38.77 kg/m   Physical Exam  Constitutional: She is oriented to person, place, and time. She appears well-developed and well-nourished. No distress.  HENT:  Head: Normocephalic and atraumatic.  Nose: Nose normal.  Eyes: EOM are normal.  Neck: Normal range of motion.  Cardiovascular: Normal rate, S1 normal, S2 normal and normal heart sounds.   Pulses:      Radial pulses are 2+ on the right side, and 2+ on the left side.       Dorsalis pedis pulses are 2+ on the right side, and 2+ on the left side.  Pulmonary/Chest: Effort normal and breath sounds normal. She has no decreased breath sounds.  Abdominal: Soft. Normal appearance and bowel sounds are normal. There is no tenderness.  No suprapubic or CVA tenderness   Musculoskeletal: She exhibits tenderness.       Lumbar back: She exhibits tenderness and pain.  + Bilateral L spine midline tenderness + Bilateral lumbar paraspinal muscular tenderness + Left SI joint and sciatic notch tender Full PROM hip  bilaterally without reported pain, pain with flexion + Positive SLR (left)   Neurological: She is alert and oriented to person, place, and time.  5/5 strength with flexion/extension of hip, knee and ankle, bilaterally.  Sensation to light touch intact in lower extremities including feet  Skin: Skin is warm and dry. Capillary refill takes less than 2 seconds.  Psychiatric: She has a normal mood and affect. Her speech is normal and behavior is normal. Judgment and thought content normal. Cognition and memory are normal.  Nursing note and vitals reviewed.  ED Treatments / Results  Labs (all labs ordered are listed, but only abnormal results are displayed) Labs Reviewed - No data to display  EKG  EKG Interpretation None       Radiology No results found.  Procedures Procedures (including critical care time)  Medications Ordered in ED Medications  HYDROmorphone (DILAUDID) injection 1 mg (1 mg Intramuscular Given 12/25/16 1552)  acetaminophen (TYLENOL) tablet 1,000 mg (1,000 mg Oral Given 12/25/16 1553)  predniSONE (DELTASONE) tablet 60 mg (60 mg Oral Given 12/25/16 1553)  methocarbamol (ROBAXIN) tablet 750 mg (750 mg Oral Given 12/25/16 1553)     Initial Impression / Assessment and Plan / ED Course  I have reviewed the triage vital signs and the nursing notes.  Pertinent labs & imaging results that were available during my care of the patient were reviewed by me and considered in my medical decision making (see chart for details).    Patient is a 51 y.o. female with a hx of chronic back pain and chronic pain syndrome on vicodin and soma who presents to the ED with acute on chronic back pain after mechanical, low height fall this morning. Ran out of vicodin today, has f/u appointment with PCP for refill in 2 days.  On exam pt has VSS, abdominal exam reassuring without suprapubic or CVAT. Distal pulses symmetric bilaterally.  Musculoskeletal exam revealed +SLR on left, diffuse lumbar  spine tenderness.  No focal neurological deficits appreciated. Patient is ambulatory. Considered ruptured disc, UTI/pyelo, PID, kidney stone, cauda equina or epidural abscess however these don't fit clinical picture. Most likely acute on chronic lumbar back pain.  No red flag symptoms of back pain including: bladder/bowel incontinence or retention, night sweats, night pain, fevers or weight loss, h/o cancer, IVDU, recent trauma or falls. Emergent lab work and Imaging not indicated today as physical exam reassuring. Conservative measures such as ice/heat, mild stretches, muscle relaxant, tylenol, prednisone, lidocaine patch to maximize pain control w/o narcotics. Recent MRI shows protrusion on S1. Advised to f/u  PCP follow-up if no improvement with conservative management. ED return precautions discussed with patient who verbalized understanding and is agreeable to plan, aware of s/s that would warrant return to ED for re-eval   Final Clinical Impressions(s) / ED Diagnoses   Final diagnoses:  Chronic bilateral low back pain with left-sided sciatica    New Prescriptions Discharge Medication List as of 12/25/2016  4:16 PM    START taking these medications   Details  lidocaine (LIDODERM) 5 % Place 1 patch onto the skin daily. Apply to lower back. Change every 24 hours., Starting Fri 12/25/2016, Until Wed 12/30/2016, Print    methocarbamol (ROBAXIN) 500 MG tablet Take 1 tablet (500 mg total) by mouth 2 (two) times daily., Starting Fri 12/25/2016, Until Wed 12/30/2016, Print    predniSONE (DELTASONE) 10 MG tablet Take 4 tablets (40 mg total) by mouth daily., Starting Fri 12/25/2016, Until Wed 12/30/2016, Print         Kinnie Feil, PA-C 12/25/16 1809    Drenda Freeze, MD 12/26/16 [phone]

## 2016-12-25 NOTE — ED Triage Notes (Signed)
Patient slipped in the bath tub this AM. Patient stated she landed on her left side. Patient now c/o left lower back pain that radiates into the left leg. Patient has increased pain with movement or weight bearing.

## 2016-12-25 NOTE — Discharge Instructions (Signed)
You presented to the ED for worsening of chronic back pain after a mechanical fall. Your physical exam was reassuring. Based on the mechanism of fall and exam findings imaging was not thought to be indicated at this time. I have low suspicion that you have any acute bony fractures or injuries. We will treat your pain with prednisone, muscle relaxants, lidocaine patch and Tylenol. The emergency department has a strict policy regarding prescription of narcotic medications for chronic pain conditions. We are unable to refill this medication in the emergency department for chronic pain. Contact your primary care provider or specialist for chronic pain management and refill on narcotic medications.   Return to the ED for changes in your back pain, abdominal pain, groin numbness, numbness or weakness to lower extremities, burning with urination, fever.

## 2017-01-18 ENCOUNTER — Other Ambulatory Visit: Payer: Self-pay | Admitting: Orthopedic Surgery

## 2017-01-29 NOTE — Pre-Procedure Instructions (Signed)
[name]  01/29/2017      [facility] [phone] - [name], [address] [place] [address] Phone: [phone] Fax: [phone]  [facility] [place], [name] [address] Phone: [phone] Fax: [phone]    Your procedure is scheduled on February 03, 2017.  Report to [facility] Admitting at 900 AM.  Call this number if you have problems the morning of surgery:  [phone]   Remember:  Do not eat food or drink liquids after midnight.  Take these medicines the morning of surgery with A SIP OF WATER carisoprodol (soma), docusate sodium (colace), hydrocodone-acetaminophen (norco), levetiracetam (keppra), pantoprazole (protonix).  7 days prior to surgery STOP taking any diclofenac (voltaren) gel,  Aspirin, Aleve, Naproxen, Ibuprofen, Motrin, Advil, Goody's, BC's, all herbal medications, fish oil, and all vitamins   Do not wear jewelry, make-up or nail polish.  Do not wear lotions, powders, or perfumes, or deoderant.  Do not shave 48 hours prior to surgery.    Do not bring valuables to the hospital.  [facility] is not responsible for any belongings or valuables.  Contacts, dentures or bridgework may not be worn into surgery.  Leave your suitcase in the car.  After surgery it may be brought to your room.  For patients admitted to the hospital, discharge time will be determined by your treatment team.  Patients discharged the day of surgery will not be allowed to drive home.   Special instructions:   [name]- Preparing For Surgery  Before surgery, you can play an important role. Because skin is not sterile, your skin needs to be as free of germs as possible. You can reduce the number of germs on your skin by washing with CHG (chlorahexidine gluconate) Soap before surgery.   CHG is an antiseptic cleaner which kills germs and bonds with the skin to continue killing germs even after washing.  Please do not use if you have an allergy to CHG or antibacterial soaps. If your skin becomes reddened/irritated stop using the CHG.  Do not shave (including legs and underarms) for at least 48 hours prior to first CHG shower. It is OK to shave your face.  Please follow these instructions carefully.   1. Shower the NIGHT BEFORE SURGERY and the MORNING OF SURGERY with CHG.   2. If you chose to wash your hair, wash your hair first as usual with your normal shampoo.  3. After you shampoo, rinse your hair and body thoroughly to remove the shampoo.  4. Use CHG as you would any other liquid soap. You can apply CHG directly to the skin and wash gently with a scrungie or a clean washcloth.   5. Apply the CHG Soap to your body ONLY FROM THE NECK DOWN.  Do not use on open wounds or open sores. Avoid contact with your eyes, ears, mouth and genitals (private parts). Wash genitals (private parts) with your normal soap.  6. Wash thoroughly, paying special attention to the area where your surgery will be performed.  7. Thoroughly rinse your body with warm water from the neck down.  8. DO NOT shower/wash with your normal soap after using and rinsing off the CHG Soap.  9. Pat yourself dry with a CLEAN TOWEL.   10. Wear CLEAN PAJAMAS  11. Place CLEAN SHEETS on your bed the night of your first shower and DO NOT SLEEP WITH PETS.    Day of Surgery: Do not apply any deodorants/lotions. Please wear clean clothes to the hospital/surgery center.     Please read over the following fact sheets that you were given. Pain Booklet, Coughing and Deep Breathing, MRSA Information and Surgical Site Infection Prevention

## 2017-02-01 ENCOUNTER — Encounter (HOSPITAL_COMMUNITY)
Admission: RE | Admit: 2017-02-01 | Discharge: 2017-02-01 | Disposition: A | Discharge status: Home / Self Care | Payer: Medicare Other | Source: Ambulatory Visit | Attending: Orthopedic Surgery | Admitting: Orthopedic Surgery

## 2017-02-01 ENCOUNTER — Encounter (HOSPITAL_COMMUNITY): Payer: Self-pay

## 2017-02-01 ENCOUNTER — Ambulatory Visit (HOSPITAL_COMMUNITY)
Admission: RE | Admit: 2017-02-01 | Discharge: 2017-02-01 | Disposition: A | Discharge status: Home / Self Care | Payer: Medicare Other | Source: Ambulatory Visit | Attending: Orthopedic Surgery | Admitting: Orthopedic Surgery

## 2017-02-01 DIAGNOSIS — Z01818 Encounter for other preprocedural examination: Secondary | ICD-10-CM

## 2017-02-01 DIAGNOSIS — Z01810 Encounter for preprocedural cardiovascular examination: Secondary | ICD-10-CM | POA: Diagnosis not present

## 2017-02-01 DIAGNOSIS — M79605 Pain in left leg: Secondary | ICD-10-CM | POA: Diagnosis not present

## 2017-02-01 DIAGNOSIS — I700 Atherosclerosis of aorta: Secondary | ICD-10-CM | POA: Diagnosis not present

## 2017-02-01 DIAGNOSIS — Z01812 Encounter for preprocedural laboratory examination: Secondary | ICD-10-CM | POA: Insufficient documentation

## 2017-02-01 HISTORY — DX: Gastro-esophageal reflux disease without esophagitis: K21.9

## 2017-02-01 LAB — COMPREHENSIVE METABOLIC PANEL
ALK PHOS: 70 U/L (ref 38–126)
ALT: 19 U/L (ref 14–54)
AST: 14 U/L — AB (ref 15–41)
Albumin: 3.5 g/dL (ref 3.5–5.0)
Anion gap: 8 (ref 5–15)
BUN: 11 mg/dL (ref 6–20)
CALCIUM: 9.1 mg/dL (ref 8.9–10.3)
CHLORIDE: 102 mmol/L (ref 101–111)
CO2: 28 mmol/L (ref 22–32)
CREATININE: 0.88 mg/dL (ref 0.44–1.00)
GFR calc non Af Amer: >60 mL/min (ref >60)
GLUCOSE: 81 mg/dL (ref 65–99)
Potassium: 3.4 mmol/L — ABNORMAL LOW (ref 3.5–5.1)
SODIUM: 138 mmol/L (ref 135–145)
Total Bilirubin: 0.3 mg/dL (ref 0.3–1.2)
Total Protein: 7.2 g/dL (ref 6.5–8.1)

## 2017-02-01 LAB — CBC WITH DIFFERENTIAL/PLATELET
BASOS ABS: 0 10*3/uL (ref 0.0–0.1)
Basophils Relative: 0 %
EOS ABS: 0.1 10*3/uL (ref 0.0–0.7)
Eosinophils Relative: 1 %
HCT: 40.3 % (ref 36.0–46.0)
HEMOGLOBIN: 13 g/dL (ref 12.0–15.0)
LYMPHS ABS: 2.9 10*3/uL (ref 0.7–4.0)
LYMPHS PCT: 46 %
MCH: 27.8 pg (ref 26.0–34.0)
MCHC: 32.3 g/dL (ref 30.0–36.0)
MCV: 86.3 fL (ref 78.0–100.0)
Monocytes Absolute: 0.4 10*3/uL (ref 0.1–1.0)
Monocytes Relative: 6 %
NEUTROS PCT: 47 %
Neutro Abs: 3 10*3/uL (ref 1.7–7.7)
PLATELETS: 370 10*3/uL (ref 150–400)
RBC: 4.67 MIL/uL (ref 3.87–5.11)
RDW: 13.8 % (ref 11.5–15.5)
WBC: 6.4 10*3/uL (ref 4.0–10.5)

## 2017-02-01 LAB — URINALYSIS, ROUTINE W REFLEX MICROSCOPIC
Bilirubin Urine: NEGATIVE
GLUCOSE, UA: NEGATIVE mg/dL
HGB URINE DIPSTICK: NEGATIVE
Ketones, ur: NEGATIVE mg/dL
LEUKOCYTES UA: NEGATIVE
Nitrite: NEGATIVE
PH: 5 (ref 5.0–8.0)
PROTEIN: NEGATIVE mg/dL
SPECIFIC GRAVITY, URINE: 1.023 (ref 1.005–1.030)

## 2017-02-01 LAB — SURGICAL PCR SCREEN
MRSA, PCR: NEGATIVE
Staphylococcus aureus: NEGATIVE

## 2017-02-01 LAB — APTT: APTT: 34 s (ref 24–36)

## 2017-02-01 LAB — PROTIME-INR
INR: 1.02
Prothrombin Time: 13.3 seconds (ref 11.4–15.2)

## 2017-02-02 MED ORDER — VANCOMYCIN HCL 10 G IV SOLR
1500.0000 mg | INTRAVENOUS | Status: AC
  Administered 2017-02-03: 1500 mg via INTRAVENOUS
  Filled 2017-02-02: qty 1500

## 2017-02-03 ENCOUNTER — Ambulatory Visit (HOSPITAL_COMMUNITY)
Admission: RE | Admit: 2017-02-03 | Discharge: 2017-02-03 | Disposition: A | Discharge status: Home / Self Care | Payer: Medicare Other | Source: Ambulatory Visit | Attending: Orthopedic Surgery | Admitting: Orthopedic Surgery

## 2017-02-03 ENCOUNTER — Encounter (HOSPITAL_COMMUNITY)
Admission: RE | Disposition: A | Discharge status: Home / Self Care | Payer: Self-pay | Source: Ambulatory Visit | Attending: Orthopedic Surgery

## 2017-02-03 ENCOUNTER — Ambulatory Visit (HOSPITAL_COMMUNITY): Payer: Medicare Other | Admitting: Certified Registered Nurse Anesthetist

## 2017-02-03 ENCOUNTER — Encounter (HOSPITAL_COMMUNITY): Payer: Self-pay | Admitting: Certified Registered Nurse Anesthetist

## 2017-02-03 ENCOUNTER — Ambulatory Visit (HOSPITAL_COMMUNITY): Payer: Medicare Other

## 2017-02-03 DIAGNOSIS — M1990 Unspecified osteoarthritis, unspecified site: Secondary | ICD-10-CM | POA: Diagnosis not present

## 2017-02-03 DIAGNOSIS — K219 Gastro-esophageal reflux disease without esophagitis: Secondary | ICD-10-CM | POA: Diagnosis not present

## 2017-02-03 DIAGNOSIS — M5117 Intervertebral disc disorders with radiculopathy, lumbosacral region: Secondary | ICD-10-CM | POA: Insufficient documentation

## 2017-02-03 DIAGNOSIS — D649 Anemia, unspecified: Secondary | ICD-10-CM | POA: Diagnosis not present

## 2017-02-03 DIAGNOSIS — Z886 Allergy status to analgesic agent status: Secondary | ICD-10-CM | POA: Diagnosis not present

## 2017-02-03 DIAGNOSIS — Z8249 Family history of ischemic heart disease and other diseases of the circulatory system: Secondary | ICD-10-CM | POA: Insufficient documentation

## 2017-02-03 DIAGNOSIS — Z880 Allergy status to penicillin: Secondary | ICD-10-CM | POA: Diagnosis not present

## 2017-02-03 DIAGNOSIS — Z6838 Body mass index (BMI) 38.0-38.9, adult: Secondary | ICD-10-CM | POA: Diagnosis not present

## 2017-02-03 DIAGNOSIS — M549 Dorsalgia, unspecified: Secondary | ICD-10-CM | POA: Diagnosis not present

## 2017-02-03 DIAGNOSIS — F17210 Nicotine dependence, cigarettes, uncomplicated: Secondary | ICD-10-CM | POA: Diagnosis not present

## 2017-02-03 DIAGNOSIS — Z803 Family history of malignant neoplasm of breast: Secondary | ICD-10-CM | POA: Diagnosis not present

## 2017-02-03 DIAGNOSIS — R569 Unspecified convulsions: Secondary | ICD-10-CM | POA: Diagnosis not present

## 2017-02-03 DIAGNOSIS — Z419 Encounter for procedure for purposes other than remedying health state, unspecified: Secondary | ICD-10-CM

## 2017-02-03 DIAGNOSIS — G5600 Carpal tunnel syndrome, unspecified upper limb: Secondary | ICD-10-CM | POA: Insufficient documentation

## 2017-02-03 DIAGNOSIS — Z833 Family history of diabetes mellitus: Secondary | ICD-10-CM | POA: Diagnosis not present

## 2017-02-03 DIAGNOSIS — I10 Essential (primary) hypertension: Secondary | ICD-10-CM | POA: Insufficient documentation

## 2017-02-03 DIAGNOSIS — Z90710 Acquired absence of both cervix and uterus: Secondary | ICD-10-CM | POA: Insufficient documentation

## 2017-02-03 DIAGNOSIS — G8929 Other chronic pain: Secondary | ICD-10-CM | POA: Diagnosis not present

## 2017-02-03 HISTORY — PX: LUMBAR LAMINECTOMY/DECOMPRESSION MICRODISCECTOMY: SHX5026

## 2017-02-03 SURGERY — LUMBAR LAMINECTOMY/DECOMPRESSION MICRODISCECTOMY 1 LEVEL
Anesthesia: General | Site: Spine Lumbar | Laterality: Left

## 2017-02-03 MED ORDER — BUPIVACAINE LIPOSOME 1.3 % IJ SUSP
20.0000 mL | INTRAMUSCULAR | Status: DC
  Filled 2017-02-03: qty 20

## 2017-02-03 MED ORDER — BUPIVACAINE-EPINEPHRINE (PF) 0.25% -1:200000 IJ SOLN
INTRAMUSCULAR | Status: AC
  Filled 2017-02-03: qty 30

## 2017-02-03 MED ORDER — DEXAMETHASONE SODIUM PHOSPHATE 10 MG/ML IJ SOLN
INTRAMUSCULAR | Status: AC
  Filled 2017-02-03: qty 1

## 2017-02-03 MED ORDER — HYDROMORPHONE HCL 1 MG/ML IJ SOLN
INTRAMUSCULAR | Status: AC
  Administered 2017-02-03: 0.5 mg via INTRAVENOUS
  Filled 2017-02-03: qty 1

## 2017-02-03 MED ORDER — ROCURONIUM BROMIDE 100 MG/10ML IV SOLN
INTRAVENOUS | Status: DC | PRN
  Administered 2017-02-03: 50 mg via INTRAVENOUS
  Administered 2017-02-03: 10 mg via INTRAVENOUS

## 2017-02-03 MED ORDER — LIDOCAINE HCL (CARDIAC) 20 MG/ML IV SOLN
INTRAVENOUS | Status: DC | PRN
  Administered 2017-02-03: 60 mg via INTRAVENOUS

## 2017-02-03 MED ORDER — PROPOFOL 10 MG/ML IV BOLUS
INTRAVENOUS | Status: DC | PRN
  Administered 2017-02-03: 20 mg via INTRAVENOUS
  Administered 2017-02-03: 100 mg via INTRAVENOUS

## 2017-02-03 MED ORDER — DEXAMETHASONE SODIUM PHOSPHATE 10 MG/ML IJ SOLN
INTRAMUSCULAR | Status: DC | PRN
  Administered 2017-02-03: 10 mg via INTRAVENOUS

## 2017-02-03 MED ORDER — PHENYLEPHRINE 40 MCG/ML (10ML) SYRINGE FOR IV PUSH (FOR BLOOD PRESSURE SUPPORT)
PREFILLED_SYRINGE | INTRAVENOUS | Status: AC
  Filled 2017-02-03: qty 20

## 2017-02-03 MED ORDER — MIDAZOLAM HCL 2 MG/2ML IJ SOLN
INTRAMUSCULAR | Status: AC
  Filled 2017-02-03: qty 2

## 2017-02-03 MED ORDER — FENTANYL CITRATE (PF) 100 MCG/2ML IJ SOLN
INTRAMUSCULAR | Status: DC | PRN
  Administered 2017-02-03 (×4): 50 ug via INTRAVENOUS

## 2017-02-03 MED ORDER — BUPIVACAINE LIPOSOME 1.3 % IJ SUSP
INTRAMUSCULAR | Status: DC | PRN
  Administered 2017-02-03: 20 mL

## 2017-02-03 MED ORDER — BUPIVACAINE-EPINEPHRINE 0.25% -1:200000 IJ SOLN
INTRAMUSCULAR | Status: DC | PRN
  Administered 2017-02-03: 20 mL
  Administered 2017-02-03: 7 mL

## 2017-02-03 MED ORDER — SUGAMMADEX SODIUM 200 MG/2ML IV SOLN
INTRAVENOUS | Status: DC | PRN
  Administered 2017-02-03: 200 mg via INTRAVENOUS

## 2017-02-03 MED ORDER — LIDOCAINE 2% (20 MG/ML) 5 ML SYRINGE
INTRAMUSCULAR | Status: AC
  Filled 2017-02-03: qty 10

## 2017-02-03 MED ORDER — POVIDONE-IODINE 7.5 % EX SOLN
Freq: Once | CUTANEOUS | Status: DC
  Filled 2017-02-03: qty 118

## 2017-02-03 MED ORDER — PHENYLEPHRINE HCL 10 MG/ML IJ SOLN
INTRAMUSCULAR | Status: DC | PRN
  Administered 2017-02-03: 40 ug via INTRAVENOUS
  Administered 2017-02-03: 80 ug via INTRAVENOUS

## 2017-02-03 MED ORDER — ONDANSETRON HCL 4 MG/2ML IJ SOLN
INTRAMUSCULAR | Status: DC | PRN
  Administered 2017-02-03: 4 mg via INTRAVENOUS

## 2017-02-03 MED ORDER — INDIGOTINDISULFONATE SODIUM 8 MG/ML IJ SOLN
INTRAMUSCULAR | Status: DC | PRN
Start: 2017-02-03 — End: 2017-02-03
  Administered 2017-02-03: 1 mL

## 2017-02-03 MED ORDER — ARTIFICIAL TEARS OPHTHALMIC OINT
TOPICAL_OINTMENT | OPHTHALMIC | Status: DC | PRN
  Administered 2017-02-03: 1 via OPHTHALMIC

## 2017-02-03 MED ORDER — OXYCODONE-ACETAMINOPHEN 5-325 MG PO TABS
1.0000 | ORAL_TABLET | Freq: Once | ORAL | Status: AC
  Administered 2017-02-03: 1 via ORAL

## 2017-02-03 MED ORDER — HYDROMORPHONE HCL 1 MG/ML IJ SOLN
0.2500 mg | INTRAMUSCULAR | Status: DC | PRN
  Administered 2017-02-03 (×4): 0.5 mg via INTRAVENOUS

## 2017-02-03 MED ORDER — THROMBIN 20000 UNITS EX SOLR
CUTANEOUS | Status: AC
  Filled 2017-02-03: qty 20000

## 2017-02-03 MED ORDER — PROPOFOL 10 MG/ML IV BOLUS
INTRAVENOUS | Status: AC
  Filled 2017-02-03: qty 20

## 2017-02-03 MED ORDER — METHYLPREDNISOLONE ACETATE 40 MG/ML IJ SUSP
INTRAMUSCULAR | Status: DC | PRN
Start: 2017-02-03 — End: 2017-02-03
  Administered 2017-02-03: 40 mg

## 2017-02-03 MED ORDER — SUGAMMADEX SODIUM 200 MG/2ML IV SOLN
INTRAVENOUS | Status: AC
  Filled 2017-02-03: qty 2

## 2017-02-03 MED ORDER — THROMBIN 20000 UNITS EX SOLR
CUTANEOUS | Status: DC | PRN
  Administered 2017-02-03: 10 mL via TOPICAL

## 2017-02-03 MED ORDER — ONDANSETRON HCL 4 MG/2ML IJ SOLN
INTRAMUSCULAR | Status: AC
  Filled 2017-02-03: qty 4

## 2017-02-03 MED ORDER — LACTATED RINGERS IV SOLN
INTRAVENOUS | Status: DC | PRN
  Administered 2017-02-03 (×2): via INTRAVENOUS

## 2017-02-03 MED ORDER — MINERAL OIL LIGHT 100 % EX OIL
TOPICAL_OIL | CUTANEOUS | Status: AC
  Filled 2017-02-03: qty 25

## 2017-02-03 MED ORDER — MIDAZOLAM HCL 5 MG/5ML IJ SOLN
INTRAMUSCULAR | Status: DC | PRN
  Administered 2017-02-03: 2 mg via INTRAVENOUS

## 2017-02-03 MED ORDER — PHENYLEPHRINE HCL 10 MG/ML IJ SOLN
INTRAVENOUS | Status: DC | PRN
  Administered 2017-02-03: 15 ug/min via INTRAVENOUS

## 2017-02-03 MED ORDER — 0.9 % SODIUM CHLORIDE (POUR BTL) OPTIME
TOPICAL | Status: DC | PRN
  Administered 2017-02-03: 1000 mL

## 2017-02-03 MED ORDER — METHYLPREDNISOLONE ACETATE 40 MG/ML IJ SUSP
INTRAMUSCULAR | Status: AC
  Filled 2017-02-03: qty 1

## 2017-02-03 MED ORDER — METHYLENE BLUE 0.5 % INJ SOLN
INTRAVENOUS | Status: AC
  Filled 2017-02-03: qty 10

## 2017-02-03 MED ORDER — LACTATED RINGERS IV SOLN
INTRAVENOUS | Status: DC
  Administered 2017-02-03: 10:00:00 via INTRAVENOUS

## 2017-02-03 MED ORDER — FENTANYL CITRATE (PF) 250 MCG/5ML IJ SOLN
INTRAMUSCULAR | Status: AC
  Filled 2017-02-03: qty 5

## 2017-02-03 MED ORDER — OXYCODONE-ACETAMINOPHEN 5-325 MG PO TABS
ORAL_TABLET | ORAL | Status: AC
  Administered 2017-02-03: 1 via ORAL
  Filled 2017-02-03: qty 1

## 2017-02-03 MED ORDER — ROCURONIUM BROMIDE 10 MG/ML (PF) SYRINGE
PREFILLED_SYRINGE | INTRAVENOUS | Status: AC
  Filled 2017-02-03: qty 5

## 2017-02-03 SURGICAL SUPPLY — 80 items
BENZOIN TINCTURE PRP APPL 2/3 (GAUZE/BANDAGES/DRESSINGS) ×3 IMPLANT
BNDG GAUZE ELAST 4 BULKY (GAUZE/BANDAGES/DRESSINGS) ×6 IMPLANT
BUR ROUND PRECISION 4.0 (BURR) ×2 IMPLANT
BUR ROUND PRECISION 4.0MM (BURR) ×1
CANISTER SUCT 3000ML PPV (MISCELLANEOUS) ×3 IMPLANT
CARTRIDGE OIL MAESTRO DRILL (MISCELLANEOUS) ×1 IMPLANT
CLOSURE STERI-STRIP 1/2X4 (GAUZE/BANDAGES/DRESSINGS) ×1
CLOSURE WOUND 1/2 X4 (GAUZE/BANDAGES/DRESSINGS)
CLSR STERI-STRIP ANTIMIC 1/2X4 (GAUZE/BANDAGES/DRESSINGS) ×2 IMPLANT
CONT SPEC 4OZ CLIKSEAL STRL BL (MISCELLANEOUS) ×6 IMPLANT
CORDS BIPOLAR (ELECTRODE) ×3 IMPLANT
COVER SURGICAL LIGHT HANDLE (MISCELLANEOUS) ×3 IMPLANT
DIFFUSER DRILL AIR PNEUMATIC (MISCELLANEOUS) ×3 IMPLANT
DRAIN CHANNEL 15F RND FF W/TCR (WOUND CARE) IMPLANT
DRAPE POUCH INSTRU U-SHP 10X18 (DRAPES) ×6 IMPLANT
DRAPE SURG 17X23 STRL (DRAPES) ×12 IMPLANT
DURAPREP 26ML APPLICATOR (WOUND CARE) ×3 IMPLANT
ELECT BLADE 4.0 EZ CLEAN MEGAD (MISCELLANEOUS) ×3
ELECT CAUTERY BLADE 6.4 (BLADE) ×3 IMPLANT
ELECT REM PT RETURN 9FT ADLT (ELECTROSURGICAL) ×3
ELECTRODE BLDE 4.0 EZ CLN MEGD (MISCELLANEOUS) ×1 IMPLANT
ELECTRODE REM PT RTRN 9FT ADLT (ELECTROSURGICAL) ×1 IMPLANT
EVACUATOR SILICONE 100CC (DRAIN) IMPLANT
FILTER STRAW FLUID ASPIR (MISCELLANEOUS) ×6 IMPLANT
GAUZE SPONGE 4X4 12PLY STRL (GAUZE/BANDAGES/DRESSINGS) ×3 IMPLANT
GAUZE SPONGE 4X4 12PLY STRL LF (GAUZE/BANDAGES/DRESSINGS) ×3 IMPLANT
GAUZE SPONGE 4X4 16PLY XRAY LF (GAUZE/BANDAGES/DRESSINGS) ×6 IMPLANT
GLOVE BIO SURGEON STRL SZ7 (GLOVE) ×3 IMPLANT
GLOVE BIO SURGEON STRL SZ8 (GLOVE) ×6 IMPLANT
GLOVE BIOGEL PI IND STRL 6.5 (GLOVE) ×1 IMPLANT
GLOVE BIOGEL PI IND STRL 7.0 (GLOVE) ×1 IMPLANT
GLOVE BIOGEL PI IND STRL 8 (GLOVE) ×1 IMPLANT
GLOVE BIOGEL PI INDICATOR 6.5 (GLOVE) ×2
GLOVE BIOGEL PI INDICATOR 7.0 (GLOVE) ×2
GLOVE BIOGEL PI INDICATOR 8 (GLOVE) ×2
GLOVE SURG SS PI 6.5 STRL IVOR (GLOVE) ×3 IMPLANT
GOWN STRL REUS W/ TWL LRG LVL3 (GOWN DISPOSABLE) ×4 IMPLANT
GOWN STRL REUS W/ TWL XL LVL3 (GOWN DISPOSABLE) ×2 IMPLANT
GOWN STRL REUS W/TWL LRG LVL3 (GOWN DISPOSABLE) ×8
GOWN STRL REUS W/TWL XL LVL3 (GOWN DISPOSABLE) ×4
IV CATH 14GX2 1/4 (CATHETERS) ×3 IMPLANT
KIT BASIN OR (CUSTOM PROCEDURE TRAY) ×3 IMPLANT
KIT POSITION SURG JACKSON T1 (MISCELLANEOUS) ×3 IMPLANT
KIT ROOM TURNOVER OR (KITS) ×3 IMPLANT
NEEDLE 18GX1X1/2 (RX/OR ONLY) (NEEDLE) ×6 IMPLANT
NEEDLE 22X1 1/2 (OR ONLY) (NEEDLE) ×3 IMPLANT
NEEDLE FILTER BLUNT 18X 1/2SAF (NEEDLE) ×2
NEEDLE FILTER BLUNT 18X1 1/2 (NEEDLE) ×1 IMPLANT
NEEDLE HYPO 25GX1X1/2 BEV (NEEDLE) ×3 IMPLANT
NEEDLE SPNL 18GX3.5 QUINCKE PK (NEEDLE) ×9 IMPLANT
NS IRRIG 1000ML POUR BTL (IV SOLUTION) ×3 IMPLANT
OIL CARTRIDGE MAESTRO DRILL (MISCELLANEOUS) ×3
PACK LAMINECTOMY ORTHO (CUSTOM PROCEDURE TRAY) ×3 IMPLANT
PACK UNIVERSAL I (CUSTOM PROCEDURE TRAY) ×3 IMPLANT
PAD ARMBOARD 7.5X6 YLW CONV (MISCELLANEOUS) ×6 IMPLANT
PATTIES SURGICAL .5 X.5 (GAUZE/BANDAGES/DRESSINGS) IMPLANT
PATTIES SURGICAL .5 X1 (DISPOSABLE) ×3 IMPLANT
SPONGE INTESTINAL PEANUT (DISPOSABLE) ×3 IMPLANT
SPONGE SURGIFOAM ABS GEL 100 (HEMOSTASIS) ×3 IMPLANT
SPONGE SURGIFOAM ABS GEL SZ50 (HEMOSTASIS) ×3 IMPLANT
STRIP CLOSURE SKIN 1/2X4 (GAUZE/BANDAGES/DRESSINGS) IMPLANT
SURGIFLO W/THROMBIN 8M KIT (HEMOSTASIS) IMPLANT
SUT MNCRL AB 4-0 PS2 18 (SUTURE) ×3 IMPLANT
SUT VIC AB 0 CT1 18XCR BRD 8 (SUTURE) ×1 IMPLANT
SUT VIC AB 0 CT1 27 (SUTURE)
SUT VIC AB 0 CT1 27XBRD ANBCTR (SUTURE) IMPLANT
SUT VIC AB 0 CT1 8-18 (SUTURE) ×2
SUT VIC AB 1 CT1 18XCR BRD 8 (SUTURE) ×1 IMPLANT
SUT VIC AB 1 CT1 8-18 (SUTURE) ×2
SUT VIC AB 2-0 CT2 18 VCP726D (SUTURE) ×3 IMPLANT
SYR 20CC LL (SYRINGE) IMPLANT
SYR BULB IRRIGATION 50ML (SYRINGE) ×6 IMPLANT
SYR CONTROL 10ML LL (SYRINGE) ×6 IMPLANT
SYR TB 1ML 26GX3/8 SAFETY (SYRINGE) IMPLANT
SYR TB 1ML LUER SLIP (SYRINGE) ×9 IMPLANT
TAPE CLOTH SURG 6X10 WHT LF (GAUZE/BANDAGES/DRESSINGS) ×3 IMPLANT
TOWEL OR 17X24 6PK STRL BLUE (TOWEL DISPOSABLE) ×3 IMPLANT
TOWEL OR 17X26 10 PK STRL BLUE (TOWEL DISPOSABLE) ×3 IMPLANT
WATER STERILE IRR 1000ML POUR (IV SOLUTION) IMPLANT
YANKAUER SUCT BULB TIP NO VENT (SUCTIONS) ×3 IMPLANT

## 2017-02-03 NOTE — Anesthesia Preprocedure Evaluation (Addendum)
Anesthesia Evaluation  Patient identified by MRN, date of birth, ID band Patient awake    Reviewed: Allergy & Precautions, H&P , NPO status , Patient's Chart, lab work & pertinent test results  Airway Mallampati: II  TM Distance: >3 FB Neck ROM: Full    Dental no notable dental hx. (+) Edentulous Upper, Edentulous Lower, Dental Advisory Given   Pulmonary Current Smoker,    Pulmonary exam normal breath sounds clear to auscultation       Cardiovascular hypertension, Pt. on medications  Rhythm:Regular Rate:Normal     Neuro/Psych Seizures -, Well Controlled,  negative psych ROS   GI/Hepatic Neg liver ROS, GERD  Medicated and Controlled,  Endo/Other  Morbid obesity  Renal/GU negative Renal ROS  negative genitourinary   Musculoskeletal  (+) Arthritis , Osteoarthritis,    Abdominal   Peds  Hematology negative hematology ROS (+) anemia ,   Anesthesia Other Findings   Reproductive/Obstetrics negative OB ROS                            Anesthesia Physical Anesthesia Plan  ASA: III  Anesthesia Plan: General   Post-op Pain Management:    Induction: Intravenous  PONV Risk Score and Plan: 3 and Ondansetron, Dexamethasone and Midazolam  Airway Management Planned: Oral ETT  Additional Equipment:   Intra-op Plan:   Post-operative Plan: Extubation in OR  Informed Consent: I have reviewed the patients History and Physical, chart, labs and discussed the procedure including the risks, benefits and alternatives for the proposed anesthesia with the patient or authorized representative who has indicated his/her understanding and acceptance.   Dental advisory given  Plan Discussed with: CRNA  Anesthesia Plan Comments:         Anesthesia Quick Evaluation

## 2017-02-03 NOTE — Transfer of Care (Signed)
Immediate Anesthesia Transfer of Care Note  Patient: [name]  Procedure(s) Performed: Procedure(s) with comments: LEFT SIDED LUMBAR 5-SACRUM 1 MICRODISECTOMY (Left) - LEFT SIDED LUMBAR 5-SACRUM 1 MICRODISECTOMY;   Patient Location: PACU  Anesthesia Type:General  Level of Consciousness: awake, alert , oriented, patient cooperative and responds to stimulation  Airway & Oxygen Therapy: Patient Spontanous Breathing and Patient connected to face mask oxygen  Post-op Assessment: Report given to RN, Post -op Vital signs reviewed and stable and Patient moving all extremities X 4  Post vital signs: Reviewed and stable  Last Vitals:  Vitals:   02/03/17 0917  BP: (!) 122/98  Pulse: 69  Resp: 18  Temp: 36.4 C  SpO2: 96%    Last Pain:  Vitals:   02/03/17 0951  TempSrc:   PainSc: 4       Patients Stated Pain Goal: 3 (40/81/44 8185)  Complications: No apparent anesthesia complications

## 2017-02-03 NOTE — H&P (Signed)
PREOPERATIVE H&P  Chief Complaint: Left leg pain  HPI: [name] is a 51 y.o. female who presents with ongoing pain in the left leg x many years  MRI reveals left L5/S1 HNP  Patient has failed multiple forms of conservative care and continues to have pain (see office notes for additional details regarding the patient's full course of treatment)  Past Medical History:  Diagnosis Date  . Arthritis   . Carpal tunnel syndrome    chronoic muscle spasms  & back pain  . Chronic back pain   . Fibroids    uterine  . GERD (gastroesophageal reflux disease)   . Hypertension   . Seizures ([place])    last seizure 4-5 years ago/on keppra  . Single stillborn delivery outcome   . Smoker    Past Surgical History:  Procedure Laterality Date  . ABDOMINAL HYSTERECTOMY    . CESAREAN SECTION     1 time  . MYOMECTOMY    . TUBAL LIGATION     Social History   Social History  . Marital status: Single    Spouse name: N/A  . Number of children: N/A  . Years of education: N/A   Social History Main Topics  . Smoking status: Current Every Day Smoker    Packs/day: 0.25    Years: 30.00    Types: Cigarettes  . Smokeless tobacco: Never Used  . Alcohol use No  . Drug use: No  . Sexual activity: Not on file   Other Topics Concern  . Not on file   Social History Narrative  . No narrative on file   Family History  Problem Relation Age of Onset  . Hypertension Mother   . Hypertension Father   . Diabetes Mellitus II Father   . Diabetes Father   . CAD Other   . Breast cancer Sister   . Hypertension Brother   . Hypertension Sister   . Hypertension Sister   . Hypertension Sister    Allergies  Allergen Reactions  . Aspirin Anaphylaxis, Hives and Swelling    Swelling of lips and mouth   . Penicillins Anaphylaxis, Hives and Swelling    Has patient had a PCN reaction causing immediate rash, facial/tongue/throat swelling, SOB or lightheadedness with hypotension: Yes Has patient  had a PCN reaction causing severe rash involving mucus membranes or skin necrosis: Yes Has patient had a PCN reaction that required hospitalization No Has patient had a PCN reaction occurring within the last 10 years: No If all of the above answers are "NO", then may proceed with Cephalosporin use.    Prior to Admission medications   Medication Sig Start Date End Date Taking? Authorizing Provider  carisoprodol (SOMA) 350 MG tablet Take 350 mg by mouth 3 (three) times daily.    Yes [provider]  diclofenac sodium (VOLTAREN) 1 % GEL Apply 2-4 g topically 4 (four) times daily as needed (for pain.).   Yes [provider]  docusate sodium (COLACE) 100 MG capsule Take 100 mg by mouth 2 (two) times daily as needed (for constipation.).    Yes [provider]  hydrochlorothiazide (HYDRODIURIL) 12.5 MG tablet Take 12.5 mg by mouth daily. 10/09/15  Yes [provider]  HYDROcodone-acetaminophen (NORCO) 10-325 MG tablet Take 1 tablet by mouth 4 (four) times daily as needed. For pain. 01/26/17  Yes [provider]  ibuprofen (ADVIL,MOTRIN) 800 MG tablet Take 800 mg by mouth every 8 (eight) hours as needed for mild  pain.   Yes [provider]  levETIRAcetam (KEPPRA) 500 MG tablet Take 1 tablet (500 mg total) by mouth 2 (two) times daily. 07/27/12  Yes Reyne Dumas, MD  pantoprazole (PROTONIX) 40 MG tablet Take 40 mg by mouth daily before breakfast.    Yes [provider]  simvastatin (ZOCOR) 10 MG tablet Take 10 mg by mouth at bedtime. 10/24/15  Yes [provider]     All other systems have been reviewed and were otherwise negative with the exception of those mentioned in the HPI and as above.  Physical Exam: There were no vitals filed for this visit.  General: Alert, no acute distress Cardiovascular: No pedal edema Respiratory: No cyanosis, no use of accessory musculature Skin: No lesions in the area of chief complaint Neurologic:  Sensation intact distally Psychiatric: Patient is competent for consent with normal mood and affect Lymphatic: No axillary or cervical lymphadenopathy  MUSCULOSKELETAL: + SLR on the left  Assessment/Plan: Left leg pain Plan for Procedure(s): LEFT SIDED LUMBAR 5-SACRUM 1 MICRODISECTOMY   Sinclair Ship, MD 02/03/2017 8:15 AM

## 2017-02-03 NOTE — Anesthesia Procedure Notes (Signed)
Procedure Name: Intubation Date/Time: 02/03/2017 11:22 AM Performed by: Tressia Miners LEFFEW Pre-anesthesia Checklist: Patient identified, Emergency Drugs available, Patient being monitored, Suction available and Timeout performed Patient Re-evaluated:Patient Re-evaluated prior to induction Oxygen Delivery Method: Circle system utilized Preoxygenation: Pre-oxygenation with 100% oxygen Induction Type: IV induction Ventilation: Mask ventilation without difficulty and Oral airway inserted - appropriate to patient size Laryngoscope Size: Mac and 3 Grade View: Grade I Tube type: Oral Tube size: 7.5 mm Number of attempts: 1 Airway Equipment and Method: Stylet Placement Confirmation: ETT inserted through vocal cords under direct vision,  positive ETCO2,  CO2 detector and breath sounds checked- equal and bilateral Secured at: 21 cm Tube secured with: Tape Dental Injury: Teeth and Oropharynx as per pre-operative assessment

## 2017-02-04 ENCOUNTER — Encounter (HOSPITAL_COMMUNITY): Payer: Self-pay | Admitting: Orthopedic Surgery

## 2017-02-04 NOTE — Anesthesia Postprocedure Evaluation (Signed)
Anesthesia Post Note  Patient: [name]  Procedure(s) Performed: Procedure(s) (LRB): LEFT SIDED LUMBAR 5-SACRUM 1 MICRODISECTOMY (Left)     Patient location during evaluation: PACU Anesthesia Type: General Level of consciousness: awake and alert Pain management: pain level controlled Vital Signs Assessment: post-procedure vital signs reviewed and stable Respiratory status: spontaneous breathing, nonlabored ventilation, respiratory function stable and patient connected to nasal cannula oxygen Cardiovascular status: blood pressure returned to baseline and stable Postop Assessment: no signs of nausea or vomiting Anesthetic complications: no    Last Vitals:  Vitals:   02/03/17 1603 02/03/17 1609  BP: (!) 145/91 137/90  Pulse: 78 83  Resp: 15 20  Temp: 36.5 C   SpO2: 97% 98%    Last Pain:  Vitals:   02/03/17 1603  TempSrc:   PainSc: 4                  Isam Unrein,JAMES TERRILL

## 2017-02-04 NOTE — Op Note (Signed)
NAME:  [name], [name]                    ACCOUNT NO.:  MEDICAL RECORD NO.:  [id number]  PHYSICIAN:  Phylliss Bob, MD      DATE OF BIRTH:  05/28/1965  DATE OF PROCEDURE:  02/03/2017                              OPERATIVE REPORT   PREOPERATIVE DIAGNOSES: 1. Left-sided S1 radiculopathy. 2. Left-sided L5-S1 disk herniation, compressing the left S1 nerve.  POSTOPERATIVE DIAGNOSES: 1. Left-sided S1 radiculopathy. 2. Left-sided L5-S1 disk herniation, compressing the left S1 nerve.  PROCEDURES:  Left-sided L5-S1 laminotomy with partial facetectomy and removal of left-sided L5-S1 intervertebral disk herniation.  SURGEON:  Phylliss Bob, MD  ASSISTANT:  Pricilla Holm, PAC.  ANESTHESIA:  General endotracheal anesthesia.  COMPLICATIONS:  None.  DISPOSITION:  Stable.  ESTIMATED BLOOD LOSS:  Minimal.  INDICATIONS FOR SURGERY:  Briefly, [name] is a very pleasant 51 year old female, who did present to me with ongoing and debilitating pain in her left leg.  She states that the pain has been present for many years, but did increase over the course of the last year.  An MRI did reveal a left-sided L5-S1 disk herniation.  She did continue to have ongoing pain despite appropriate conservative treatment measures.  Given her ongoing pain and dysfunction, we did discuss proceeding with the procedure noted above.  The patient was fully aware of the risks and limitations of surgery and did elect to proceed.  OPERATIVE DETAILS:  On February 03, 2017, the patient was brought to Surgery and general endotracheal anesthesia was administered.  The patient was placed prone on a well-padded flat Jackson bed with a spinal frame.  Antibiotics were given.  The back was prepped and draped, and a time-out procedure was performed.  A midline incision was then made overlying the L5-S1 intervertebral space.  The fascia was incised just to the left of the midline.  A Taylor retractor was placed just  over the L5-S1 facet joint, retracting the left-sided paraspinal musculature.  An intraoperative radiograph did confirm the appropriate operative level. I then used a high-speed bur to remove the inferior and medial aspect of the L5 lamina.  A partial facetectomy was performed.  The lateral aspect of the ligamentum flavum was identified and removed.  Of note, this particular portion of the procedure, specifically, the approach, was very meticulous, as the patient did have a very large body habitus, and was morbidly obese.  Given this, it did take significant retraction from multiple operating room assistants in order to adequately retract the adipose tissue for proper visualization.  The depth from the skin to the depths of the wound was much more than typical, which did result in a rather meticulous surgery.  However, I was able to safely identify the traversing left S1 nerve, which was gently medially retracted.  Left- sided L5-S1 disk herniation was encountered.  I then used a nerve hook to release the outer layers overlying the intervertebral disk herniation, after which point, a large herniated fragment was removed in multiple segments.  I did explore the intervertebral space through the annular defect in order to ensure that there were no additional loose fragments encountered.  There were multiple fragments encountered, which were removed using a straight and upward biting micropituitary.  The wound was then copiously irrigated.  The nerve was noted  to be adequately decompressed at the termination of the procedure.  The wound was copiously irrigated at this point once again.  40 mg of Depo-Medrol was introduced about the epidural space in the region of the left S1 nerve.  The fascia was closed using #1 Vicryl.  The subcutaneous layer was closed using 0 Vicryl followed by 2-0 Vicryl, and the skin was closed using 4-0 Monocryl.  Benzoin and Steri-Strips were applied followed by  sterile dressing.  All instrument counts were correct at the termination of the procedure.  Of note, Pricilla Holm did function as my assistant throughout surgery, and did aid in retraction, suctioning, and closure from start to finish.     Phylliss Bob, MD     MD/MEDQ  D:  02/03/2017  T:  02/04/2017  Job:  [id number]

## 2017-02-22 HISTORY — PX: BACK SURGERY: SHX140

## 2017-07-18 ENCOUNTER — Encounter (HOSPITAL_COMMUNITY): Payer: Self-pay

## 2017-07-18 ENCOUNTER — Emergency Department (HOSPITAL_COMMUNITY): Payer: Medicare Other

## 2017-07-18 ENCOUNTER — Other Ambulatory Visit: Payer: Self-pay

## 2017-07-18 ENCOUNTER — Emergency Department (HOSPITAL_COMMUNITY)
Admission: EM | Admit: 2017-07-18 | Discharge: 2017-07-18 | Disposition: A | Discharge status: Home / Self Care | Payer: Medicare Other | Attending: Emergency Medicine | Admitting: Emergency Medicine

## 2017-07-18 DIAGNOSIS — W109XXA Fall (on) (from) unspecified stairs and steps, initial encounter: Secondary | ICD-10-CM | POA: Diagnosis not present

## 2017-07-18 DIAGNOSIS — F17210 Nicotine dependence, cigarettes, uncomplicated: Secondary | ICD-10-CM | POA: Diagnosis not present

## 2017-07-18 DIAGNOSIS — Y999 Unspecified external cause status: Secondary | ICD-10-CM | POA: Diagnosis not present

## 2017-07-18 DIAGNOSIS — Z79899 Other long term (current) drug therapy: Secondary | ICD-10-CM | POA: Diagnosis not present

## 2017-07-18 DIAGNOSIS — I10 Essential (primary) hypertension: Secondary | ICD-10-CM | POA: Diagnosis not present

## 2017-07-18 DIAGNOSIS — M25551 Pain in right hip: Secondary | ICD-10-CM | POA: Insufficient documentation

## 2017-07-18 DIAGNOSIS — Y929 Unspecified place or not applicable: Secondary | ICD-10-CM | POA: Diagnosis not present

## 2017-07-18 DIAGNOSIS — M545 Low back pain: Secondary | ICD-10-CM | POA: Diagnosis not present

## 2017-07-18 DIAGNOSIS — W19XXXA Unspecified fall, initial encounter: Secondary | ICD-10-CM

## 2017-07-18 DIAGNOSIS — Y9389 Activity, other specified: Secondary | ICD-10-CM | POA: Diagnosis not present

## 2017-07-18 MED ORDER — HYDROCODONE-ACETAMINOPHEN 5-325 MG PO TABS: 1.0000 | ORAL_TABLET | Freq: Four times a day (QID) | ORAL | 0 refills | Status: DC | PRN

## 2017-07-18 MED ORDER — HYDROCODONE-ACETAMINOPHEN 5-325 MG PO TABS
2.0000 | ORAL_TABLET | Freq: Once | ORAL | Status: AC
  Administered 2017-07-18: 2 via ORAL
  Filled 2017-07-18: qty 2

## 2017-07-18 NOTE — ED Triage Notes (Signed)
Pt c/o a fall yesterday, pt was walking down wet stairs when she slipped and fell. Pt reports 10/10 pain to right hip and leg pain. Hx of back surgery 4 months ago. Pt also reports productive cough and congestion for about a week. Denies chest pain or shortness of breath

## 2017-07-18 NOTE — ED Provider Notes (Signed)
[facility] DEPT Provider Note   CSN: [id number] Arrival date & time: 07/18/17  1754     History   Chief Complaint Chief Complaint  Patient presents with  . Fall    HPI [name] is a 52 y.o. female with history of hypertension, chronic back pain with history of lumbar laminectomy and decompression microdiscectomy at L5-S1 4 years ago who presents with right hip pain and right low back pain after she slipped and fell on wet stairs yesterday.  Patient has been ambulatory since the fall.  She denies hitting her head or losing consciousness.  She denies any new numbness or tingling.  She fell and hit her right hip and right knee.  She has had some lateral right knee pain.  She has taken Tylenol and ibuprofen at home without relief.  Patient normally takes  Norco 10 mg and Soma, however she is out of these at this time and does not get them refilled for 1 week.  Patient also reports cough and nasal congestion for the past week.  She denies any chest pain or shortness of breath.  She has been taking Alka-Seltzer at home with some relief.  She denies any fever, abdominal pain, vomiting.  She  has had some associated nausea over the past week related to her postnasal drip and nasal congestion.  She denies any loss of bowel or bladder control, saddle anesthesia.  HPI  Past Medical History:  Diagnosis Date  . Arthritis   . Carpal tunnel syndrome    chronoic muscle spasms  & back pain  . Chronic back pain   . Fibroids    uterine  . GERD (gastroesophageal reflux disease)   . Hypertension   . Seizures ([place])    last seizure 4-5 years ago/on keppra  . Single stillborn delivery outcome   . Smoker     Patient Active Problem List   Diagnosis Date Noted  . Other convulsions 07/26/2012  . Seizures ([place]) 07/26/2012  . Hypertension 07/26/2012  . Arthritis 07/26/2012  . Tobacco abuse 07/26/2012  . Anemia 07/26/2012    Past Surgical History:  Procedure  Laterality Date  . ABDOMINAL HYSTERECTOMY    . BACK SURGERY  02/2017  . CESAREAN SECTION     1 time  . LUMBAR LAMINECTOMY/DECOMPRESSION MICRODISCECTOMY Left 02/03/2017   Procedure: LEFT SIDED LUMBAR 5-SACRUM 1 MICRODISECTOMY;  Surgeon: Phylliss Bob, MD;  Location: [facility];  Service: Orthopedics;  Laterality: Left;  LEFT SIDED LUMBAR 5-SACRUM 1 MICRODISECTOMY;   . MYOMECTOMY    . TUBAL LIGATION      OB History    No data available       Home Medications    Prior to Admission medications   Medication Sig Start Date End Date Taking? Authorizing Provider  diclofenac sodium (VOLTAREN) 1 % GEL Apply 2-4 g topically 4 (four) times daily as needed (for pain.).    [provider]  docusate sodium (COLACE) 100 MG capsule Take 100 mg by mouth 2 (two) times daily as needed (for constipation.).     [provider]  hydrochlorothiazide (HYDRODIURIL) 12.5 MG tablet Take 12.5 mg by mouth daily. 10/09/15   [provider]  HYDROcodone-acetaminophen (NORCO/VICODIN) 5-325 MG tablet Take 1-2 tablets by mouth every 6 (six) hours as needed. 07/18/17   Lynniah Janoski, Bea Graff, PA-C  levETIRAcetam (KEPPRA) 500 MG tablet Take 1 tablet (500 mg total) by mouth 2 (two) times daily. 07/27/12   Reyne Dumas, MD  pantoprazole (  PROTONIX) 40 MG tablet Take 40 mg by mouth daily before breakfast.     [provider]  simvastatin (ZOCOR) 10 MG tablet Take 10 mg by mouth at bedtime. 10/24/15   [provider]    Family History Family History  Problem Relation Age of Onset  . Hypertension Mother   . Hypertension Father   . Diabetes Mellitus II Father   . Diabetes Father   . CAD Other   . Breast cancer Sister   . Hypertension Brother   . Hypertension Sister   . Hypertension Sister   . Hypertension Sister     Social History Social History   Tobacco Use  . Smoking status: Current Every Day Smoker    Packs/day: 0.25    Years: 30.00    Pack years: 7.50    Types: Cigarettes    . Smokeless tobacco: Never Used  Substance Use Topics  . Alcohol use: No  . Drug use: No     Allergies   Aspirin and Penicillins   Review of Systems Review of Systems  Constitutional: Negative for chills and fever.  HENT: Positive for congestion. Negative for facial swelling and sore throat.   Respiratory: Positive for cough. Negative for shortness of breath.   Cardiovascular: Negative for chest pain.  Gastrointestinal: Positive for nausea. Negative for abdominal pain and vomiting.  Genitourinary: Negative for dysuria.  Musculoskeletal: Positive for arthralgias, back pain and neck pain (chronic, unchanged after fall).  Skin: Negative for rash and wound.  Neurological: Negative for headaches.  Psychiatric/Behavioral: The patient is not nervous/anxious.      Physical Exam Updated Vital Signs BP (!) 149/88   Pulse 67   Temp 98.3 F (36.8 C) (Oral)   Resp 18   Ht 5\' 5"  (1.651 m)   Wt 108 kg (238 lb)   SpO2 100%   BMI 39.61 kg/m   Physical Exam  Constitutional: She appears well-developed and well-nourished. No distress.  HENT:  Head: Normocephalic and atraumatic.  Mouth/Throat: Oropharynx is clear and moist. No oropharyngeal exudate.  Eyes: Conjunctivae are normal. Pupils are equal, round, and reactive to light. Right eye exhibits no discharge. Left eye exhibits no discharge. No scleral icterus.  Neck: Normal range of motion. Neck supple. No thyromegaly present.  Cardiovascular: Normal rate, regular rhythm, normal heart sounds and intact distal pulses. Exam reveals no gallop and no friction rub.  No murmur heard. Pulmonary/Chest: Effort normal. No stridor. No respiratory distress. She has wheezes (expiratory). She has rales.  Abdominal: Soft. Bowel sounds are normal. She exhibits no distension. There is no tenderness. There is no rebound and no guarding.  Musculoskeletal: She exhibits no edema.  Midline tenderness over C7 (chronic, currently worked up by PCP, unchanged  after fall) Midline tenderness to lumbar spine and right paraspinal muscles extending into the right gluteal region and lateral hip Right lateral knee pain, otherwise no tenderness with palpation about the knee  Lymphadenopathy:    She has no cervical adenopathy.  Neurological: She is alert. Coordination normal.  Skin: Skin is warm and dry. No rash noted. She is not diaphoretic. No pallor.  Psychiatric: She has a normal mood and affect.  Nursing note and vitals reviewed.    ED Treatments / Results  Labs (all labs ordered are listed, but only abnormal results are displayed) Labs Reviewed - No data to display  EKG  EKG Interpretation None       Radiology Dg Chest 2 View  Result Date: 07/18/2017 CLINICAL DATA:  Productive cough, congestion EXAM: CHEST  2 VIEW COMPARISON:  02/01/2017 FINDINGS: Heart and mediastinal contours are within normal limits. No focal opacities or effusions. No acute bony abnormality. IMPRESSION: No active cardiopulmonary disease. Electronically Signed   By: Rolm Baptise M.D.   On: 07/18/2017 20:27   Dg Lumbar Spine Complete  Result Date: 07/18/2017 CLINICAL DATA:  Fall down stairs yesterday. Low back pain. Initial encounter. EXAM: LUMBAR SPINE - COMPLETE 4+ VIEW COMPARISON:  None. FINDINGS: There is no evidence of lumbar spine fracture. Alignment is normal. Intervertebral disc spaces are maintained. No other osseous abnormality identified. IMPRESSION: Negative. Electronically Signed   By: Earle Gell M.D.   On: 07/18/2017 20:26   Dg Knee Complete 4 Views Right  Result Date: 07/18/2017 CLINICAL DATA:  Fall down stairs yesterday. Right knee pain. Initial encounter. EXAM: RIGHT KNEE - COMPLETE 4+ VIEW COMPARISON:  None. FINDINGS: No evidence of fracture, dislocation, or joint effusion. No evidence of arthropathy or other focal bone abnormality. Soft tissues are unremarkable. IMPRESSION: Negative. Electronically Signed   By: Earle Gell M.D.   On: 07/18/2017 20:25     Dg Hip Unilat W Or Wo Pelvis 2-3 Views Right  Result Date: 07/18/2017 CLINICAL DATA:  [address] down stairs yesterday. Right hip pain. Initial encounter. EXAM: DG HIP (WITH OR WITHOUT PELVIS) 2-3V RIGHT COMPARISON:  None. FINDINGS: There is no evidence of hip fracture or dislocation. There is no evidence of arthropathy or other focal bone abnormality. IMPRESSION: Negative. Electronically Signed   By: Earle Gell M.D.   On: 07/18/2017 20:24    Procedures Procedures (including critical care time)  Medications Ordered in ED Medications  HYDROcodone-acetaminophen (NORCO/VICODIN) 5-325 MG per tablet 2 tablet (2 tablets Oral Given 07/18/17 1946)     Initial Impression / Assessment and Plan / ED Course  I have reviewed the triage vital signs and the nursing notes.  Pertinent labs & imaging results that were available during my care of the patient were reviewed by me and considered in my medical decision making (see chart for details).     Patient presenting following fall yesterday.  X-rays of the hip, lumbar spine, and knee are negative.  Chest x-ray is negative, conducted because of 1 week of coughing.  Suspect contusions and muscle soreness following fall.  Patient is ambulatory.  Neurovascularly intact.  Patient does not have any of her Norco at home and considering this new fall and patient does not see her PCP for 1 week, will give short prescription.  Advised to alternate with ibuprofen as well as use ice.  Strict return precautions given.  Patient understands and agrees with plan.  Patient vitals stable throughout ED course and discharged in satisfactory condition.  Final Clinical Impressions(s) / ED Diagnoses   Final diagnoses:  Fall, initial encounter    ED Discharge Orders        Ordered    HYDROcodone-acetaminophen (NORCO/VICODIN) 5-325 MG tablet  Every 6 hours PRN     07/18/17 2106       Frederica Kuster, PA-C 07/18/17 2245    Tegeler, Gwenyth Allegra, MD 07/19/17 [phone]

## 2017-07-18 NOTE — Discharge Instructions (Signed)
Medications: Norco  Treatment: Take 1-2 Norco every 4-6 hours as needed for severe pain.  Alternate this with ibuprofen as prescribed over-the-counter.  Use ice for the first few days alternating 20 minutes on, 20 minutes off.  After this, you can switch to heating pad and alternate in the same manner.  Try to stretch your back and leg as tolerated several times daily.  Do not drink alcohol, drive, operate machinery or participate in any other potentially dangerous activities while taking opiate pain medication as it may make you sleepy. Do not take this medication with any other sedating medications, either prescription or over-the-counter. If you were prescribed Percocet or Vicodin, do not take these with acetaminophen (Tylenol) as it is already contained within these medications and overdose of Tylenol is dangerous.   This medication is an opiate (or narcotic) pain medication and can be habit forming.  Use it as little as possible to achieve adequate pain control.  Do not use or use it with extreme caution if you have a history of opiate abuse or dependence. This medication is intended for your use only - do not give any to anyone else and keep it in a secure place where nobody else, especially children, have access to it. It will also cause or worsen constipation, so you may want to consider taking an over-the-counter stool softener while you are taking this medication.  Follow-up: Please follow-up with your doctor within 1 week for recheck.  Please return to the emergency department if you develop any new or worsening symptoms.

## 2017-07-26 ENCOUNTER — Emergency Department (HOSPITAL_COMMUNITY)
Admission: EM | Admit: 2017-07-26 | Discharge: 2017-07-26 | Disposition: A | Discharge status: Home / Self Care | Payer: Medicare Other | Attending: Emergency Medicine | Admitting: Emergency Medicine

## 2017-07-26 ENCOUNTER — Emergency Department (HOSPITAL_COMMUNITY): Payer: Medicare Other

## 2017-07-26 ENCOUNTER — Encounter (HOSPITAL_COMMUNITY): Payer: Self-pay | Admitting: Family Medicine

## 2017-07-26 DIAGNOSIS — M62830 Muscle spasm of back: Secondary | ICD-10-CM

## 2017-07-26 DIAGNOSIS — Y929 Unspecified place or not applicable: Secondary | ICD-10-CM | POA: Diagnosis not present

## 2017-07-26 DIAGNOSIS — I10 Essential (primary) hypertension: Secondary | ICD-10-CM | POA: Diagnosis not present

## 2017-07-26 DIAGNOSIS — Y939 Activity, unspecified: Secondary | ICD-10-CM | POA: Diagnosis not present

## 2017-07-26 DIAGNOSIS — S161XXA Strain of muscle, fascia and tendon at neck level, initial encounter: Secondary | ICD-10-CM | POA: Diagnosis not present

## 2017-07-26 DIAGNOSIS — Z79899 Other long term (current) drug therapy: Secondary | ICD-10-CM | POA: Diagnosis not present

## 2017-07-26 DIAGNOSIS — F17210 Nicotine dependence, cigarettes, uncomplicated: Secondary | ICD-10-CM | POA: Insufficient documentation

## 2017-07-26 DIAGNOSIS — G8929 Other chronic pain: Secondary | ICD-10-CM

## 2017-07-26 DIAGNOSIS — S39012A Strain of muscle, fascia and tendon of lower back, initial encounter: Secondary | ICD-10-CM

## 2017-07-26 DIAGNOSIS — Y999 Unspecified external cause status: Secondary | ICD-10-CM | POA: Diagnosis not present

## 2017-07-26 DIAGNOSIS — S199XXA Unspecified injury of neck, initial encounter: Secondary | ICD-10-CM | POA: Diagnosis present

## 2017-07-26 DIAGNOSIS — M542 Cervicalgia: Secondary | ICD-10-CM

## 2017-07-26 DIAGNOSIS — M545 Low back pain, unspecified: Secondary | ICD-10-CM

## 2017-07-26 DIAGNOSIS — M62838 Other muscle spasm: Secondary | ICD-10-CM

## 2017-07-26 MED ORDER — CYCLOBENZAPRINE HCL 10 MG PO TABS
10.0000 mg | ORAL_TABLET | Freq: Once | ORAL | Status: AC
  Administered 2017-07-26: 10 mg via ORAL
  Filled 2017-07-26: qty 1

## 2017-07-26 MED ORDER — CYCLOBENZAPRINE HCL 10 MG PO TABS: 10.0000 mg | ORAL_TABLET | Freq: Three times a day (TID) | ORAL | 0 refills | Status: DC | PRN

## 2017-07-26 MED ORDER — NAPROXEN 500 MG PO TABS
500.0000 mg | ORAL_TABLET | Freq: Once | ORAL | Status: AC
  Administered 2017-07-26: 500 mg via ORAL
  Filled 2017-07-26: qty 1

## 2017-07-26 MED ORDER — HYDROCODONE-ACETAMINOPHEN 5-325 MG PO TABS
2.0000 | ORAL_TABLET | Freq: Once | ORAL | Status: AC
  Administered 2017-07-26: 2 via ORAL
  Filled 2017-07-26: qty 2

## 2017-07-26 MED ORDER — NAPROXEN 500 MG PO TABS: 500.0000 mg | ORAL_TABLET | Freq: Two times a day (BID) | ORAL | 0 refills | Status: DC

## 2017-07-26 NOTE — ED Notes (Signed)
Patient transported to x-ray. ?

## 2017-07-26 NOTE — Discharge Instructions (Signed)
Take naprosyn as directed for inflammation and pain with tylenol for breakthrough pain and flexeril for muscle relaxation. Do not drive or operate machinery with muscle relaxant use. Ice to areas of soreness for the next 24 hours and then may move to heat, no more than 20 minutes at a time every hour for each. Expect to be sore for the next few days and follow up with primary care physician for recheck of ongoing symptoms in the next 5-7 days. Return to ER for emergent changing or worsening of symptoms.
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
## 2017-11-05 ENCOUNTER — Other Ambulatory Visit: Payer: Self-pay | Admitting: Internal Medicine

## 2017-11-05 DIAGNOSIS — E2839 Other primary ovarian failure: Secondary | ICD-10-CM

## 2017-11-24 ENCOUNTER — Other Ambulatory Visit: Payer: Self-pay | Admitting: Internal Medicine

## 2017-11-24 DIAGNOSIS — Z1231 Encounter for screening mammogram for malignant neoplasm of breast: Secondary | ICD-10-CM

## 2018-01-10 ENCOUNTER — Ambulatory Visit
Admission: RE | Admit: 2018-01-10 | Discharge: 2018-01-10 | Disposition: A | Discharge status: Home / Self Care | Payer: Medicare Other | Source: Ambulatory Visit | Attending: Internal Medicine | Admitting: Internal Medicine

## 2018-01-10 DIAGNOSIS — Z1231 Encounter for screening mammogram for malignant neoplasm of breast: Secondary | ICD-10-CM

## 2018-01-10 DIAGNOSIS — E2839 Other primary ovarian failure: Secondary | ICD-10-CM

## 2018-11-30 ENCOUNTER — Other Ambulatory Visit: Payer: Self-pay | Admitting: Internal Medicine

## 2018-11-30 DIAGNOSIS — Z1231 Encounter for screening mammogram for malignant neoplasm of breast: Secondary | ICD-10-CM

## 2019-01-12 ENCOUNTER — Ambulatory Visit: Payer: Medicare Other

## 2019-01-13 ENCOUNTER — Other Ambulatory Visit: Payer: Self-pay

## 2019-01-13 ENCOUNTER — Ambulatory Visit
Admission: RE | Admit: 2019-01-13 | Discharge: 2019-01-13 | Disposition: A | Discharge status: Home / Self Care | Payer: Medicare Other | Source: Ambulatory Visit | Attending: Internal Medicine | Admitting: Internal Medicine

## 2019-01-13 DIAGNOSIS — Z1231 Encounter for screening mammogram for malignant neoplasm of breast: Secondary | ICD-10-CM

## 2019-01-16 ENCOUNTER — Other Ambulatory Visit (HOSPITAL_COMMUNITY): Payer: Self-pay | Admitting: General Surgery

## 2019-01-16 ENCOUNTER — Other Ambulatory Visit: Payer: Self-pay | Admitting: General Surgery

## 2019-01-17 ENCOUNTER — Other Ambulatory Visit: Payer: Self-pay | Admitting: Pain Medicine

## 2019-01-17 ENCOUNTER — Ambulatory Visit
Admission: RE | Admit: 2019-01-17 | Discharge: 2019-01-17 | Disposition: A | Discharge status: Home / Self Care | Payer: Medicare Other | Source: Ambulatory Visit | Attending: Pain Medicine | Admitting: Pain Medicine

## 2019-01-17 DIAGNOSIS — M542 Cervicalgia: Secondary | ICD-10-CM

## 2019-01-20 ENCOUNTER — Telehealth: Payer: Self-pay

## 2019-01-20 NOTE — Telephone Encounter (Signed)
NOTES ON FILE FROM [facility] [phone], SENT REFERRAL TO SCHEDULING

## 2019-02-08 ENCOUNTER — Ambulatory Visit (HOSPITAL_COMMUNITY): Admission: RE | Admit: 2019-02-08 | Payer: Medicare Other | Source: Ambulatory Visit

## 2019-02-08 ENCOUNTER — Encounter (HOSPITAL_COMMUNITY): Payer: Self-pay

## 2019-02-15 ENCOUNTER — Other Ambulatory Visit: Payer: Self-pay

## 2019-02-15 ENCOUNTER — Ambulatory Visit (HOSPITAL_COMMUNITY)
Admission: RE | Admit: 2019-02-15 | Discharge: 2019-02-15 | Disposition: A | Discharge status: Home / Self Care | Payer: Medicare Other | Source: Ambulatory Visit | Attending: General Surgery | Admitting: General Surgery

## 2019-03-29 ENCOUNTER — Encounter: Payer: Medicare Other | Attending: General Surgery | Admitting: Dietician

## 2019-03-29 ENCOUNTER — Other Ambulatory Visit: Payer: Self-pay

## 2019-03-29 ENCOUNTER — Encounter: Payer: Self-pay | Admitting: Dietician

## 2019-03-29 DIAGNOSIS — E782 Mixed hyperlipidemia: Secondary | ICD-10-CM | POA: Insufficient documentation

## 2019-03-29 DIAGNOSIS — E669 Obesity, unspecified: Secondary | ICD-10-CM

## 2019-03-29 NOTE — Patient Instructions (Signed)
Begin working through the [employer] we discussed today, choosing 1-2 goals to start with. Be sure and discuss these at your doctor visits as you prepare for surgery.   Please reach out in the meantime with questions or concerns. See you at Pre-Op Class!

## 2019-03-29 NOTE — Progress Notes (Signed)
Nutrition Assessment for Bariatric Surgery Medical Nutrition Therapy  Appt Start Time: 2:05pm    End Time: 3:15pm  Patient was seen on 03/29/2019 for Pre-Operative Nutrition Assessment. Letter of approval faxed to [facility] bariatric surgery program coordinator on 03/29/2019.   Referral stated Supervised Weight Loss (SWL) visits needed: 6    *To be completed with PCP, already in progress   Planned surgery: Sleeve  Pt expectation of surgery: to relieve pain, be able to be more active, be healthier, have more confidence, get back into dating, wear smaller sized clothing  Pt expectation of dietitian: guidance for dietary choices after surgery    NUTRITION ASSESSMENT   Anthropometrics  Start weight at [facility]: 245 lbs (date: 03/29/2019) Height: 64.5 in BMI: 41.4 kg/m2    Clinical  Medical hx: obesity, arthritis, hemorrhoids, HTN, hypercholesterolemia, seizure disorder, GERD, oophorectomy, c-section, hysterectomy, oral surgery, spinal surgery Medications: hydrocodone acetaminophen, pantoprazole, hydrochlorothiazide, simvastatin, potassium chloride, levetiracetam, carisoprodol, dulcolax     Lifestyle & Dietary Hx Patient lives with her 2 sons (42 and 66.) States she has a grandchild. Current smoker (1/2 pack/day.) Looking forward to having surgery to gain more confidence in how she looks and to relieve pain so she can feel better and be more active.    Typical meal pattern is 2-3 meals per day (1-2 during the day and a lot of eating at night.) Does not eat breakfast/any food before ~11am. Likes coffee, and uses flavored cream and 3 tsp sugar. Certain meats bother stomach (such as pork) so sticks with chicken, fish, and [place]. May snack on Ritz crackers if anything. If eats out (1-2x/month) will have a burger from [employer]. Dislikes fried and greasy foods.   24-Hr Dietary Recall First Meal: - Snack: - Second Meal: egg + cheese + toast + [place] sausage  Snack: [place] & cheese  sandwich  Third Meal: grilled chicken salad w/ feta cheese  Snack: - Beverages: coffee, water, ginger ale   Estimated Energy Needs Calories: 1600 Carbohydrate: 180g Protein: 100g Fat: 53g   NUTRITION DIAGNOSIS  Overweight/obesity (-3.3) related to past poor dietary habits and physical inactivity as evidenced by patient w/ planned Sleeve Gastrecomty surgery following dietary guidelines for continued weight loss.    NUTRITION INTERVENTION  Nutrition counseling (C-1) and education (E-2) to facilitate bariatric surgery goals.  Discussed smoking cessation, incorporating breakfast (to encourage frequent meals throughout the day), identifying appropriate protein supplements, and the following Pre-Op Goals to be discussed with PCP at remaining SWL visits.   Pre-Op Goals Reviewed with the Patient . Track food and beverage intake (pen and paper, MyFitness Pal, Barit[employer] app, etc.) . Make healthy food choices while monitoring portion sizes . Consume 3 meals per day or try to eat every 3-5 hours . Avoid concentrated sugars and fried foods . Keep sugar & fat in the single digits per serving on food labels . Practice CHEWING your food (aim for applesauce consistency) . Practice not drinking 15 minutes before, during, and 30 minutes after each meal and snack . Avoid all carbonated beverages (ex: soda, sparkling beverages)  . Limit caffeinated beverages (ex: coffee, tea, energy drinks) . Avoid all sugar-sweetened beverages (ex: regular soda, sports drinks)  . Avoid alcohol  . Aim for 64-100 ounces of FLUID daily (with at least half of fluid intake being plain water)  . Aim for at least 60-80 grams of PROTEIN daily . Look for a liquid protein source that contains ?15 g protein and ?5 g carbohydrate (ex: shakes,  drinks, shots) . Make a list of non-food related activities . Physical activity is an important part of a healthy lifestyle so keep it moving! The goal is to reach 150 minutes of  exercise per week, including cardiovascular and weight baring activity.  Handouts Provided Include  . Bariatric Surgery handouts (Nutrition Visits, Pre-Op Goals, Protein Shakes, Vitamins & Minerals)  Learning Style & Readiness for Change Teaching method utilized: Visual & Auditory  Demonstrated degree of understanding via: Teach Back  Barriers to learning/adherence to lifestyle change: None Identified    MONITORING & EVALUATION Dietary intake, weekly physical activity, body weight, and pre-op goals reached at next nutrition visit.   Next Steps Patient is to call [facility] to enroll in Pre-Op Class (>2 weeks before surgery) and Post-Op Class (2 weeks after surgery) for further nutrition education when surgery date is scheduled.

## 2019-05-03 ENCOUNTER — Ambulatory Visit: Payer: Medicare Other | Admitting: Psychology

## 2019-05-11 NOTE — Progress Notes (Signed)
Cardiology Office Note   Date:  05/12/2019   ID:  [name], DOB 06/03/1965, MRN [id number]  PCP:  Nolene Ebbs, MD  Cardiologist:   No primary care provider on file. Referring:  Kinsinger, Arta Bruce, *   No chief complaint on file.     History of Present Illness: [name] is a 53 y.o. female who presents for preoperative clearance.  She is referred by Gurney Maxin, MD. she is considering bariatric surgery for weight loss.  She has an abnormal EKG suggesting possible old anteroseptal infarct.  However, she has had no prior cardiac history.  She has no risk factors.  She has a good functional level and can climb a flight of stairs and vacuum her house without getting chest pressure, neck or arm discomfort.  She may have a little shortness of breath with significant activity but this is baseline.  She does not describe any resting shortness of breath, PND or orthopnea.  She does not have any palpitations, presyncope or syncope.  She has had no lower extremity swelling.  She has not had any prior cardiac testing.   Past Medical History:  Diagnosis Date  . Arthritis   . Carpal tunnel syndrome    chronoic muscle spasms  & back pain  . Chronic back pain   . Fibroids    uterine  . GERD (gastroesophageal reflux disease)   . Hypertension   . Seizures ([place])    last seizure 4-5 years ago/on keppra  . Single stillborn delivery outcome   . Smoker     Past Surgical History:  Procedure Laterality Date  . ABDOMINAL HYSTERECTOMY    . BACK SURGERY  02/2017  . CESAREAN SECTION     1 time  . LUMBAR LAMINECTOMY/DECOMPRESSION MICRODISCECTOMY Left 02/03/2017   Procedure: LEFT SIDED LUMBAR 5-SACRUM 1 MICRODISECTOMY;  Surgeon: Phylliss Bob, MD;  Location: [facility];  Service: Orthopedics;  Laterality: Left;  LEFT SIDED LUMBAR 5-SACRUM 1 MICRODISECTOMY;   . MYOMECTOMY    . TUBAL LIGATION       Current Outpatient Medications  Medication Sig Dispense Refill  . BIOTIN 5000 PO  Take 1 tablet by mouth daily.    . carisoprodol (SOMA) 350 MG tablet Take 350 mg by mouth daily as needed.    . ergocalciferol (VITAMIN D2) 1.25 MG (50000 UT) capsule Take 50,000 Units by mouth once a week.    . folic acid (FOLVITE) A999333 MCG tablet Take 400 mcg by mouth daily.    . hydrochlorothiazide (HYDRODIURIL) 12.5 MG tablet Take 12.5 mg by mouth daily.  5  . HYDROcodone-acetaminophen (NORCO/VICODIN) 5-325 MG tablet Take 1-2 tablets by mouth every 6 (six) hours as needed. 12 tablet 0  . ibuprofen (ADVIL) 800 MG tablet Take 800 mg by mouth as directed.    . levETIRAcetam (KEPPRA) 500 MG tablet Take 1 tablet (500 mg total) by mouth 2 (two) times daily. 60 tablet 3  . pantoprazole (PROTONIX) 40 MG tablet Take 40 mg by mouth daily before breakfast.     . potassium chloride (KLOR-CON) 10 MEQ tablet Take 1 tablet by mouth at bedtime.    . simvastatin (ZOCOR) 10 MG tablet Take 10 mg by mouth at bedtime.  5   No current facility-administered medications for this visit.    Allergies:   Aspirin and Penicillins    Social History:  The patient  reports that she has been smoking cigarettes. She has a 7.50 pack-year smoking history. She has never  used smokeless tobacco. She reports that she does not drink alcohol or use drugs.   Family History:  The patient's family history includes Breast cancer in her sister; CAD in an other family member; Diabetes in her father; Diabetes Mellitus II in her father; Hypertension in her brother, father, mother, sister, sister, and sister.   The exit cause of her mother's death is not certain.   ROS:  Please see the history of present illness.   Otherwise, review of systems are positive for none.   All other systems are reviewed and negative.    PHYSICAL EXAM: VS:  BP 122/79   Pulse 69   Ht 5\' 5"  (1.651 m)   Wt 251 lb 3.2 oz (113.9 kg)   SpO2 93%   BMI 41.80 kg/m  , BMI Body mass index is 41.8 kg/m. GENERAL:  Well appearing HEENT:  Pupils equal round and  reactive, fundi not visualized, oral mucosa unremarkable NECK:  No jugular venous distention, waveform within normal limits, carotid upstroke brisk and symmetric, no bruits, no thyromegaly LYMPHATICS:  No cervical, inguinal adenopathy LUNGS:  Clear to auscultation bilaterally BACK:  No CVA tenderness CHEST:  Unremarkable HEART:  PMI not displaced or sustained,S1 and S2 within normal limits, no S3, no S4, no clicks, no rubs, no murmurs ABD:  Flat, positive bowel sounds normal in frequency in pitch, no bruits, no rebound, no guarding, no midline pulsatile mass, no hepatomegaly, no splenomegaly EXT:  2 plus pulses throughout, no edema, no cyanosis no clubbing SKIN:  No rashes no nodules NEURO:  Cranial nerves II through XII grossly intact, motor grossly intact throughout PSYCH:  Cognitively intact, oriented to person place and time    EKG:  EKG is not ordered today. The ekg ordered 02/15/2019 demonstrates poor anterior R wave progression, axis within normal limits, intervals within normal limits, no acute ST-T wave changes.   Recent Labs: No results found for requested labs within last 8760 hours.    Lipid Panel   Wt Readings from Last 3 Encounters:  05/12/19 251 lb 3.2 oz (113.9 kg)  03/29/19 245 lb (111.1 kg)  07/26/17 230 lb (104.3 kg)      Other studies Reviewed: Additional studies/ records that were reviewed today include: Surgical records, EKG. Review of the above records demonstrates:  Please see elsewhere in the note.     ASSESSMENT AND PLAN:  PREOP: The patient has no findings that are high risk.  She has a high functional level.  She is not going for surgery that is thought to be high risk from a cardiovascular standpoint.  Given this set according to ACC/AHA guidelines no further testing is indicated.  ABNORMAL EKG: This is likely lead placement.  The pretest probability of obstructive coronary disease or prior myocardial infarction is low.  She needs primary risk  reduction.  HTN: We talked about blood pressure control hers is well treated.  No change in therapy.  TOBACCO ABUSE: We talked about this and I would like her to try to call [place]: She has no interest.  Vaccine.  She plans to get the weight from people.   Current medicines are reviewed at length with the patient today.  The patient does not have concerns regarding medicines.  The following changes have been made:  no change  Labs/ tests ordered today include: None No orders of the defined types were placed in this encounter.    Disposition:   FU with me as needed.  Signed, Minus Breeding, MD  05/12/2019 5:34 PM    [facility]

## 2019-05-12 ENCOUNTER — Encounter (INDEPENDENT_AMBULATORY_CARE_PROVIDER_SITE_OTHER): Payer: Self-pay

## 2019-05-12 ENCOUNTER — Ambulatory Visit (INDEPENDENT_AMBULATORY_CARE_PROVIDER_SITE_OTHER): Payer: Medicare Other | Admitting: Cardiology

## 2019-05-12 ENCOUNTER — Encounter: Payer: Self-pay | Admitting: Cardiology

## 2019-05-12 ENCOUNTER — Other Ambulatory Visit: Payer: Self-pay

## 2019-05-12 VITALS — BP 122/79 | HR 69 | Ht 65.0 in | Wt 251.2 lb

## 2019-05-12 DIAGNOSIS — Z720 Tobacco use: Secondary | ICD-10-CM

## 2019-05-12 DIAGNOSIS — Z01810 Encounter for preprocedural cardiovascular examination: Secondary | ICD-10-CM

## 2019-05-12 DIAGNOSIS — I10 Essential (primary) hypertension: Secondary | ICD-10-CM | POA: Diagnosis not present

## 2019-05-12 DIAGNOSIS — R9431 Abnormal electrocardiogram [ECG] [EKG]: Secondary | ICD-10-CM | POA: Diagnosis not present

## 2019-05-12 DIAGNOSIS — Z7189 Other specified counseling: Secondary | ICD-10-CM

## 2019-05-12 NOTE — Patient Instructions (Signed)
Medication Instructions:  Your physician recommends that you continue on your current medications as directed. Please refer to the Current Medication list given to you today.  *If you need a refill on your cardiac medications before your next appointment, please call your pharmacy*  Lab Work: none If you have labs (blood work) drawn today and your tests are completely normal, you will receive your results only by: Marland Kitchen MyChart Message (if you have MyChart) OR . A paper copy in the mail If you have any lab test that is abnormal or we need to change your treatment, we will call you to review the results.  Testing/Procedures: none  Follow-Up: At [facility], you and your health needs are our priority.  As part of our continuing mission to provide you with exceptional heart care, we have created designated Provider Care Teams.  These Care Teams include your primary Cardiologist (physician) and Advanced Practice Providers (APPs -  Physician Assistants and Nurse Practitioners) who all work together to provide you with the care you need, when you need it.  Your next appointment:   As needed  The format for your next appointment:   Either In Person or Virtual  Provider:   You may see DR. HOCHREIN or one of the following Advanced Practice Providers on your designated Care Team:    Rosaria Ferries, PA-C  Jory Sims, DNP, ANP  Cadence Kathlen Mody, NP   Other Instructions 1-800-QUITNOW   Health Risks of Smoking Smoking cigarettes is very bad for your health. Tobacco smoke has over 200 known poisons in it. It contains the poisonous gases nitrogen oxide and carbon monoxide. There are over 60 chemicals in tobacco smoke that cause cancer. Smoking is difficult to quit because a chemical in tobacco, called nicotine, causes addiction or dependence. When you smoke and inhale, nicotine is absorbed rapidly into the bloodstream through your lungs. Both inhaled and non-inhaled nicotine may be  addictive. What are the risks of cigarette smoke? Cigarette smokers have an increased risk of many serious medical problems, including:  Lung cancer.  Lung disease, such as pneumonia, bronchitis, and emphysema.  Chest pain (angina) and heart attack because the heart is not getting enough oxygen.  Heart disease and peripheral blood vessel disease.  High blood pressure (hypertension).  Stroke.  Oral cancer, including cancer of the lip, mouth, or voice box.  Bladder cancer.  Pancreatic cancer.  Cervical cancer.  Pregnancy complications, including premature birth.  Stillbirths and smaller newborn babies, birth defects, and genetic damage to sperm.  Early menopause.  Lower estrogen level for women.  Infertility.  Facial wrinkles.  Blindness.  Increased risk of broken bones (fractures).  Senile dementia.  Stomach ulcers and internal bleeding.  Delayed wound healing and increased risk of complications during surgery.  Even smoking lightly shortens your life expectancy by several years. Because of secondhand smoke exposure, children of smokers have an increased risk of the following:  Sudden infant death syndrome (SIDS).  Respiratory infections.  Lung cancer.  Heart disease.  Ear infections. What are the benefits of quitting? There are many health benefits of quitting smoking. Here are some of them:  Within days of quitting smoking, your risk of having a heart attack decreases, your blood flow improves, and your lung capacity improves. Blood pressure, pulse rate, and breathing patterns start returning to normal soon after quitting.  Within months, your lungs may clear up completely.  Quitting for 10 years reduces your risk of developing lung cancer and heart disease to almost  that of a nonsmoker.  People who quit may see an improvement in their overall quality of life. How do I quit smoking?     Smoking is an addiction with both physical and  psychological effects, and longtime habits can be hard to change. Your health care provider can recommend:  Programs and community resources, which may include group support, education, or talk therapy.  Prescription medicines to help reduce cravings.  Nicotine replacement products, such as patches, gum, and nasal sprays. Use these products only as directed. Do not replace cigarette smoking with electronic cigarettes, which are commonly called e-cigarettes. The safety of e-cigarettes is not known, and some may contain harmful chemicals.  A combination of two or more of these methods. Where to find more information  American Lung Association: www.lung.org  American Cancer Society: www.cancer.org Summary  Smoking cigarettes is very bad for your health. Cigarette smokers have an increased risk of many serious medical problems, including several cancers, heart disease, and stroke.  Smoking is an addiction with both physical and psychological effects, and longtime habits can be hard to change.  By stopping right away, you can greatly reduce the risk of medical problems for you and your family.  To help you quit smoking, your health care provider can recommend programs, community resources, prescription medicines, and nicotine replacement products such as patches, gum, and nasal sprays. This information is not intended to replace advice given to you by your health care provider. Make sure you discuss any questions you have with your health care provider. Document Released: 06/18/2004 Document Revised: 08/12/2017 Document Reviewed: 05/15/2016 Elsevier Patient Education  2020 [employer].

## 2019-05-17 ENCOUNTER — Ambulatory Visit: Payer: Medicare Other | Admitting: Psychology

## 2019-06-14 ENCOUNTER — Ambulatory Visit: Payer: Medicare Other | Admitting: Psychology

## 2019-11-29 ENCOUNTER — Ambulatory Visit
Admission: RE | Admit: 2019-11-29 | Discharge: 2019-11-29 | Disposition: A | Discharge status: Home / Self Care | Payer: Medicare Other | Source: Ambulatory Visit | Attending: Physician Assistant | Admitting: Physician Assistant

## 2019-11-29 ENCOUNTER — Other Ambulatory Visit: Payer: Self-pay

## 2019-11-29 ENCOUNTER — Other Ambulatory Visit: Payer: Self-pay | Admitting: Physician Assistant

## 2019-11-29 DIAGNOSIS — M25511 Pain in right shoulder: Secondary | ICD-10-CM

## 2020-03-21 ENCOUNTER — Other Ambulatory Visit: Payer: Self-pay | Admitting: Internal Medicine

## 2020-03-21 DIAGNOSIS — Z1231 Encounter for screening mammogram for malignant neoplasm of breast: Secondary | ICD-10-CM

## 2020-05-02 ENCOUNTER — Other Ambulatory Visit: Payer: Self-pay

## 2020-05-02 ENCOUNTER — Ambulatory Visit
Admission: RE | Admit: 2020-05-02 | Discharge: 2020-05-02 | Disposition: A | Discharge status: Home / Self Care | Payer: Medicare Other | Source: Ambulatory Visit | Attending: Internal Medicine | Admitting: Internal Medicine

## 2020-05-02 DIAGNOSIS — Z1231 Encounter for screening mammogram for malignant neoplasm of breast: Secondary | ICD-10-CM

## 2020-11-01 ENCOUNTER — Other Ambulatory Visit: Payer: Self-pay | Admitting: Internal Medicine

## 2020-11-04 LAB — URINE CULTURE
MICRO NUMBER:: 11994099
Result:: NO GROWTH
SPECIMEN QUALITY:: ADEQUATE

## 2020-11-04 LAB — COMPLETE METABOLIC PANEL WITH GFR
AG Ratio: 1.3 (calc) (ref 1.0–2.5)
ALT: 13 U/L (ref 6–29)
AST: 13 U/L (ref 10–35)
Albumin: 3.9 g/dL (ref 3.6–5.1)
Alkaline phosphatase (APISO): 70 U/L (ref 37–153)
BUN: 16 mg/dL (ref 7–25)
CO2: 25 mmol/L (ref 20–32)
Calcium: 9.4 mg/dL (ref 8.6–10.4)
Chloride: 105 mmol/L (ref 98–110)
Creat: 0.76 mg/dL (ref 0.50–1.05)
GFR, Est African American: 102 mL/min/{1.73_m2} (ref >=60)
GFR, Est Non African American: 88 mL/min/{1.73_m2} (ref >=60)
Globulin: 3.1 g/dL (calc) (ref 1.9–3.7)
Glucose, Bld: 88 mg/dL (ref 65–99)
Potassium: 4 mmol/L (ref 3.5–5.3)
Sodium: 140 mmol/L (ref 135–146)
Total Bilirubin: 0.3 mg/dL (ref 0.2–1.2)
Total Protein: 7 g/dL (ref 6.1–8.1)

## 2020-11-04 LAB — CBC
HCT: 39.6 % (ref 35.0–45.0)
Hemoglobin: 13 g/dL (ref 11.7–15.5)
MCH: 28.1 pg (ref 27.0–33.0)
MCHC: 32.8 g/dL (ref 32.0–36.0)
MCV: 85.7 fL (ref 80.0–100.0)
MPV: 9.2 fL (ref 7.5–12.5)
Platelets: 411 10*3/uL — ABNORMAL HIGH (ref 140–400)
RBC: 4.62 10*6/uL (ref 3.80–5.10)
RDW: 13 % (ref 11.0–15.0)
WBC: 6.6 10*3/uL (ref 3.8–10.8)

## 2020-11-04 LAB — LIPID PANEL
Cholesterol: 178 mg/dL (ref <200)
HDL: 44 mg/dL — ABNORMAL LOW (ref >=50)
LDL Cholesterol (Calc): 112 mg/dL (calc) — ABNORMAL HIGH
Non-HDL Cholesterol (Calc): 134 mg/dL (calc) — ABNORMAL HIGH (ref <130)
Total CHOL/HDL Ratio: 4 (calc) (ref <5.0)
Triglycerides: 117 mg/dL (ref <150)

## 2020-11-04 LAB — TSH: TSH: 1.31 mIU/L (ref 0.40–4.50)

## 2020-11-04 LAB — C. TRACHOMATIS/N. GONORRHOEAE RNA
C. trachomatis RNA, TMA: NOT DETECTED
N. gonorrhoeae RNA, TMA: NOT DETECTED

## 2020-11-04 LAB — VITAMIN D 25 HYDROXY (VIT D DEFICIENCY, FRACTURES): Vit D, 25-Hydroxy: 29 ng/mL — ABNORMAL LOW (ref 30–100)

## 2020-11-27 ENCOUNTER — Ambulatory Visit: Payer: Self-pay

## 2020-11-27 ENCOUNTER — Ambulatory Visit (INDEPENDENT_AMBULATORY_CARE_PROVIDER_SITE_OTHER): Payer: Medicare Other | Admitting: Orthopaedic Surgery

## 2020-11-27 VITALS — Ht 64.0 in | Wt 246.4 lb

## 2020-11-27 DIAGNOSIS — M25561 Pain in right knee: Secondary | ICD-10-CM

## 2020-11-27 DIAGNOSIS — M25571 Pain in right ankle and joints of right foot: Secondary | ICD-10-CM

## 2020-11-27 DIAGNOSIS — G8929 Other chronic pain: Secondary | ICD-10-CM | POA: Diagnosis not present

## 2020-11-27 NOTE — Progress Notes (Signed)
Office Visit Note   Patient: [name]           Date of Birth: 09/15/1965           MRN: [id number] Visit Date: 11/27/2020              Requested by: Nolene Ebbs, MD [address] [place],  [place] [address] PCP: Nolene Ebbs, MD   Assessment & Plan: Visit Diagnoses:  1. Pain in right ankle and joints of right foot   2. Chronic pain of right knee     Plan: I talked her in length showing her knee model and ankle model and went over her x-rays with her.  I think the best thing for her was better shoe wear for her feet and weight loss for her knees.  I think combination of alternating Tylenol arthritis and Aleve would be good for the inflammation and pain with her knee and ankle.  At some point she may benefit from injections but I think weight loss and quad strengthening exercises will be the most beneficial and she agrees.  If she decides to have an injection at some point in her knees she will let us know.  All questions and concerns were answered and addressed.  Follow-up is as needed.  Follow-Up Instructions: Return if symptoms worsen or fail to improve.   Orders:  Orders Placed This Encounter  Procedures   XR Knee 1-2 Views Right   XR Ankle Complete Right   No orders of the defined types were placed in this encounter.     Procedures: No procedures performed   Clinical Data: No additional findings.   Subjective: Chief Complaint  Patient presents with   Right Knee - Pain  The patient is a very pleasant 55 year old female referred to me for chronic right knee pain and right ankle and foot pain.  She does report that she was young when she may have fractured her right ankle.  She has been told that she may need a knee replacement at some point for her right knee.  She has never had any surgery on her right knee.  She is never had any injections either.  She is someone who is in chronic pain management and takes Soma and hydrocodone for back issues.  She takes  ibuprofen for inflammation.  She points to her knee globally as a source of her pain with her right knee with no locking and catching.  Her ankle hurts globally as well.  She is mainly wearing sandals and open back shoes.  HPI  Review of Systems There is currently listed no headache, chest pain, shortness of breath, fever, chills, nausea, vomiting  Objective: Vital Signs: Ht 5\' 4"  (1.626 m)   Wt 246 lb 6.4 oz (111.8 kg)   BMI 42.29 kg/m   Physical Exam She is alert and orient x3 and in no acute distress Ortho Exam On examination both knees actually hyperextend.  There is no effusion of her right knee.  There is patellofemoral crepitation of both knees but it is minimal.  There is global tenderness with her right knee but is ligamentously stable with full range of motion.  Her right and left feet do show slight foot foot deformities but excellent range of motion and no instability on exam. Specialty Comments:  No specialty comments available.  Imaging: XR Ankle Complete Right  Result Date: 11/27/2020 3 views of the right ankle show no acute findings.  She does have some slight  flattening of the foot on the lateral view but the ankle joint is well-maintained and there is no evidence of previous fracture.  XR Knee 1-2 Views Right  Result Date: 11/27/2020 2 views of the right knee show no acute findings.  The joint space is well-maintained.  There is only slight medial joint space narrowing.  The alignment is neutral.    PMFS History: Patient Active Problem List   Diagnosis Date Noted   Other convulsions 07/26/2012   Seizures ([place]) 07/26/2012   Hypertension 07/26/2012   Arthritis 07/26/2012   Tobacco abuse 07/26/2012   Anemia 07/26/2012   Past Medical History:  Diagnosis Date   Arthritis    Carpal tunnel syndrome    chronoic muscle spasms  & back pain   Chronic back pain    Fibroids    uterine   GERD (gastroesophageal reflux disease)    Hypertension    Seizures ([place])     last seizure 4-5 years ago/on keppra   Single stillborn delivery outcome    Smoker     Family History  Problem Relation Age of Onset   Hypertension Mother    Hypertension Father    Diabetes Mellitus II Father    Diabetes Father    CAD Other    Breast cancer Sister    Hypertension Brother    Hypertension Sister    Hypertension Sister    Hypertension Sister     Past Surgical History:  Procedure Laterality Date   ABDOMINAL HYSTERECTOMY     BACK SURGERY  02/2017   CESAREAN SECTION     1 time   LUMBAR LAMINECTOMY/DECOMPRESSION MICRODISCECTOMY Left 02/03/2017   Procedure: LEFT SIDED LUMBAR 5-SACRUM 1 MICRODISECTOMY;  Surgeon: Phylliss Bob, MD;  Location: [facility];  Service: Orthopedics;  Laterality: Left;  LEFT SIDED LUMBAR 5-SACRUM 1 MICRODISECTOMY;    MYOMECTOMY     TUBAL LIGATION     Social History   Occupational History   Not on file  Tobacco Use   Smoking status: Every Day    Packs/day: 0.25    Years: 30.00    Pack years: 7.50    Types: Cigarettes   Smokeless tobacco: Never  Vaping Use   Vaping Use: Never used  Substance and Sexual Activity   Alcohol use: No   Drug use: No   Sexual activity: Not on file

## 2020-12-30 ENCOUNTER — Ambulatory Visit: Payer: Medicare Other | Admitting: Orthopaedic Surgery

## 2021-01-15 ENCOUNTER — Ambulatory Visit (INDEPENDENT_AMBULATORY_CARE_PROVIDER_SITE_OTHER): Payer: Medicare Other | Admitting: Orthopaedic Surgery

## 2021-01-15 ENCOUNTER — Encounter: Payer: Self-pay | Admitting: Orthopaedic Surgery

## 2021-01-15 VITALS — Ht 64.0 in | Wt 239.0 lb

## 2021-01-15 DIAGNOSIS — M25561 Pain in right knee: Secondary | ICD-10-CM

## 2021-01-15 DIAGNOSIS — G8929 Other chronic pain: Secondary | ICD-10-CM | POA: Diagnosis not present

## 2021-01-15 NOTE — Progress Notes (Signed)
The patient comes in for follow-up with chronic knee and chronic ankle pain.  When I saw her last visit I recommended a steroid injection if he got to her was hurting her left.  She deferred a steroid injection at the last visit.  Even today though she says she has more good days and bad days and still deferring any type of treatment such as injections.  We did advocate weight loss for her and quad training exercises.  Her BMI is 41.  Examination of both knees show that there is slight valgus malalignment in both knees hyperextend.  There is no effusion with either knee and both knees move well.  She still understands it was can help her most with weight loss and quad strengthening.  She is still deferring any other treatment such as injections which would be my next recommendation for treatment.  All question concerns were answered and addressed.  Follow-up is as needed.

## 2021-09-18 ENCOUNTER — Other Ambulatory Visit: Payer: Self-pay | Admitting: Internal Medicine

## 2021-09-18 DIAGNOSIS — Z1231 Encounter for screening mammogram for malignant neoplasm of breast: Secondary | ICD-10-CM

## 2021-10-01 ENCOUNTER — Ambulatory Visit
Admission: RE | Admit: 2021-10-01 | Discharge: 2021-10-01 | Disposition: A | Discharge status: Home / Self Care | Payer: Medicare Other | Source: Ambulatory Visit | Attending: Internal Medicine | Admitting: Internal Medicine

## 2021-10-01 DIAGNOSIS — Z1231 Encounter for screening mammogram for malignant neoplasm of breast: Secondary | ICD-10-CM

## 2021-10-03 ENCOUNTER — Other Ambulatory Visit: Payer: Self-pay | Admitting: Nurse Practitioner

## 2021-10-03 ENCOUNTER — Ambulatory Visit
Admission: RE | Admit: 2021-10-03 | Discharge: 2021-10-03 | Disposition: A | Discharge status: Home / Self Care | Payer: Medicare Other | Source: Ambulatory Visit | Attending: Nurse Practitioner | Admitting: Nurse Practitioner

## 2021-10-03 DIAGNOSIS — M25512 Pain in left shoulder: Secondary | ICD-10-CM

## 2021-10-09 ENCOUNTER — Other Ambulatory Visit: Payer: Self-pay | Admitting: Nurse Practitioner

## 2021-10-09 DIAGNOSIS — M25512 Pain in left shoulder: Secondary | ICD-10-CM

## 2021-11-14 ENCOUNTER — Other Ambulatory Visit: Payer: Self-pay | Admitting: Nurse Practitioner

## 2021-11-14 ENCOUNTER — Ambulatory Visit
Admission: RE | Admit: 2021-11-14 | Discharge: 2021-11-14 | Disposition: A | Discharge status: Home / Self Care | Payer: Medicare Other | Source: Ambulatory Visit | Attending: Nurse Practitioner | Admitting: Nurse Practitioner

## 2021-11-14 DIAGNOSIS — M25561 Pain in right knee: Secondary | ICD-10-CM

## 2021-11-14 DIAGNOSIS — M5459 Other low back pain: Secondary | ICD-10-CM

## 2022-03-16 ENCOUNTER — Other Ambulatory Visit (HOSPITAL_COMMUNITY): Payer: Self-pay

## 2022-03-16 MED ORDER — HYDROCODONE-ACETAMINOPHEN 10-325 MG PO TABS
1.0000 | ORAL_TABLET | Freq: Four times a day (QID) | ORAL | 0 refills | Status: DC | PRN
  Filled 2022-03-16: qty 120, 30d supply, fill #0

## 2022-03-23 ENCOUNTER — Other Ambulatory Visit: Payer: Self-pay

## 2022-03-23 ENCOUNTER — Encounter (HOSPITAL_COMMUNITY): Payer: Self-pay

## 2022-03-23 ENCOUNTER — Inpatient Hospital Stay (HOSPITAL_COMMUNITY)
Admission: EM | Admit: 2022-03-23 | Discharge: 2022-03-27 | DRG: 684 | Disposition: A | Discharge status: Home / Self Care | Payer: Medicare Other | Attending: Family Medicine | Admitting: Family Medicine

## 2022-03-23 DIAGNOSIS — N179 Acute kidney failure, unspecified: Principal | ICD-10-CM | POA: Diagnosis present

## 2022-03-23 DIAGNOSIS — E669 Obesity, unspecified: Secondary | ICD-10-CM | POA: Insufficient documentation

## 2022-03-23 DIAGNOSIS — G8929 Other chronic pain: Secondary | ICD-10-CM | POA: Diagnosis present

## 2022-03-23 DIAGNOSIS — K219 Gastro-esophageal reflux disease without esophagitis: Secondary | ICD-10-CM | POA: Diagnosis present

## 2022-03-23 DIAGNOSIS — I10 Essential (primary) hypertension: Secondary | ICD-10-CM | POA: Diagnosis present

## 2022-03-23 DIAGNOSIS — Z8249 Family history of ischemic heart disease and other diseases of the circulatory system: Secondary | ICD-10-CM

## 2022-03-23 DIAGNOSIS — F17210 Nicotine dependence, cigarettes, uncomplicated: Secondary | ICD-10-CM | POA: Diagnosis present

## 2022-03-23 DIAGNOSIS — N10 Acute tubulo-interstitial nephritis: Principal | ICD-10-CM

## 2022-03-23 DIAGNOSIS — Z1152 Encounter for screening for COVID-19: Secondary | ICD-10-CM

## 2022-03-23 DIAGNOSIS — Z833 Family history of diabetes mellitus: Secondary | ICD-10-CM

## 2022-03-23 DIAGNOSIS — E785 Hyperlipidemia, unspecified: Secondary | ICD-10-CM | POA: Diagnosis present

## 2022-03-23 DIAGNOSIS — R112 Nausea with vomiting, unspecified: Secondary | ICD-10-CM

## 2022-03-23 DIAGNOSIS — M549 Dorsalgia, unspecified: Secondary | ICD-10-CM | POA: Diagnosis present

## 2022-03-23 DIAGNOSIS — Z90710 Acquired absence of both cervix and uterus: Secondary | ICD-10-CM

## 2022-03-23 DIAGNOSIS — R109 Unspecified abdominal pain: Secondary | ICD-10-CM

## 2022-03-23 DIAGNOSIS — E860 Dehydration: Secondary | ICD-10-CM | POA: Diagnosis present

## 2022-03-23 DIAGNOSIS — Z803 Family history of malignant neoplasm of breast: Secondary | ICD-10-CM

## 2022-03-23 DIAGNOSIS — E6609 Other obesity due to excess calories: Secondary | ICD-10-CM | POA: Diagnosis present

## 2022-03-23 DIAGNOSIS — Z6835 Body mass index (BMI) 35.0-35.9, adult: Secondary | ICD-10-CM

## 2022-03-23 DIAGNOSIS — Z886 Allergy status to analgesic agent status: Secondary | ICD-10-CM

## 2022-03-23 DIAGNOSIS — R569 Unspecified convulsions: Secondary | ICD-10-CM | POA: Diagnosis present

## 2022-03-23 DIAGNOSIS — M1990 Unspecified osteoarthritis, unspecified site: Secondary | ICD-10-CM | POA: Diagnosis present

## 2022-03-23 DIAGNOSIS — Z79899 Other long term (current) drug therapy: Secondary | ICD-10-CM

## 2022-03-23 DIAGNOSIS — Z880 Allergy status to penicillin: Secondary | ICD-10-CM

## 2022-03-23 LAB — COMPREHENSIVE METABOLIC PANEL
ALT: 17 U/L (ref 0–44)
AST: 16 U/L (ref 15–41)
Albumin: 3.6 g/dL (ref 3.5–5.0)
Alkaline Phosphatase: 63 U/L (ref 38–126)
Anion gap: 14 (ref 5–15)
BUN: 41 mg/dL — ABNORMAL HIGH (ref 6–20)
CO2: 21 mmol/L — ABNORMAL LOW (ref 22–32)
Calcium: 9.6 mg/dL (ref 8.9–10.3)
Chloride: 104 mmol/L (ref 98–111)
Creatinine, Ser: 1.92 mg/dL — ABNORMAL HIGH (ref 0.44–1.00)
GFR, Estimated: 30 mL/min — ABNORMAL LOW (ref >60)
Glucose, Bld: 94 mg/dL (ref 70–99)
Potassium: 4.7 mmol/L (ref 3.5–5.1)
Sodium: 139 mmol/L (ref 135–145)
Total Bilirubin: 1.1 mg/dL (ref 0.3–1.2)
Total Protein: 7.7 g/dL (ref 6.5–8.1)

## 2022-03-23 LAB — CBC
HCT: 40.6 % (ref 36.0–46.0)
Hemoglobin: 13.3 g/dL (ref 12.0–15.0)
MCH: 29.4 pg (ref 26.0–34.0)
MCHC: 32.8 g/dL (ref 30.0–36.0)
MCV: 89.8 fL (ref 80.0–100.0)
Platelets: 376 10*3/uL (ref 150–400)
RBC: 4.52 MIL/uL (ref 3.87–5.11)
RDW: 14.5 % (ref 11.5–15.5)
WBC: 7.9 10*3/uL (ref 4.0–10.5)
nRBC: 0 % (ref 0.0–0.2)

## 2022-03-23 LAB — MAGNESIUM: Magnesium: 2 mg/dL (ref 1.7–2.4)

## 2022-03-23 LAB — RESP PANEL BY RT-PCR (FLU A&B, COVID) ARPGX2
Influenza A by PCR: NEGATIVE
Influenza B by PCR: NEGATIVE
SARS Coronavirus 2 by RT PCR: NEGATIVE

## 2022-03-23 LAB — LIPASE, BLOOD: Lipase: 48 U/L (ref 11–51)

## 2022-03-23 NOTE — ED Provider Triage Note (Signed)
Emergency Medicine Provider Triage Evaluation Note  [name] , a 56 y.o. female  was evaluated in triage.  Patient complains of abdominal pain, nausea and vomiting since Thursday.  Reports being on Mounjaro for the past couple of months and a month ago her dose was increased.  She is prescribed Phenergan and Zofran for this nausea however it is not helping her.  She is unable to hold these medications down.  Says she is lightheaded when she stands Review of Systems  Positive:  Negative:   Physical Exam  BP 106/82 (BP Location: Right Arm)   Pulse (!) 104   Temp 98.7 F (37.1 C) (Oral)   Resp 15   Ht '5\' 4"'$  (1.626 m)   Wt 92.5 kg   SpO2 98%   BMI 35.02 kg/m  Gen:   Awake, no distress   Resp:  Normal effort  MSK:   Moves extremities without difficulty  Other:  Pale, soft, nondistended and nontender abdomen  Medical Decision Making  Medically screening exam initiated at 4:43 PM.  Appropriate orders placed.  KOMAL STANGELO was informed that the remainder of the evaluation will be completed by another provider, this initial triage assessment does not replace that evaluation, and the importance of remaining in the ED until their evaluation is complete.     Rhae Hammock, [place] 03/23/22 1644

## 2022-03-23 NOTE — ED Triage Notes (Signed)
Reports having n/v and unable to hold anything down since Thursday.  Complains of cramping in her abd.  Reports lower back pain and when she gets up she gets dizzy.

## 2022-03-24 ENCOUNTER — Emergency Department (HOSPITAL_COMMUNITY): Payer: Medicare Other

## 2022-03-24 ENCOUNTER — Encounter (HOSPITAL_COMMUNITY): Payer: Self-pay | Admitting: Internal Medicine

## 2022-03-24 DIAGNOSIS — G8929 Other chronic pain: Secondary | ICD-10-CM | POA: Diagnosis present

## 2022-03-24 DIAGNOSIS — N179 Acute kidney failure, unspecified: Secondary | ICD-10-CM | POA: Diagnosis not present

## 2022-03-24 DIAGNOSIS — E785 Hyperlipidemia, unspecified: Secondary | ICD-10-CM | POA: Diagnosis present

## 2022-03-24 DIAGNOSIS — E6609 Other obesity due to excess calories: Secondary | ICD-10-CM

## 2022-03-24 DIAGNOSIS — E669 Obesity, unspecified: Secondary | ICD-10-CM | POA: Insufficient documentation

## 2022-03-24 DIAGNOSIS — R112 Nausea with vomiting, unspecified: Secondary | ICD-10-CM | POA: Diagnosis present

## 2022-03-24 LAB — URINALYSIS, ROUTINE W REFLEX MICROSCOPIC
Bilirubin Urine: NEGATIVE
Glucose, UA: NEGATIVE mg/dL
Hgb urine dipstick: NEGATIVE
Ketones, ur: 20 mg/dL — AB
Nitrite: NEGATIVE
Protein, ur: 30 mg/dL — AB
Specific Gravity, Urine: 1.026 (ref 1.005–1.030)
pH: 5 (ref 5.0–8.0)

## 2022-03-24 MED ORDER — BISACODYL 5 MG PO TBEC: 5.0000 mg | DELAYED_RELEASE_TABLET | Freq: Every day | ORAL | Status: DC | PRN

## 2022-03-24 MED ORDER — HYDROCODONE-ACETAMINOPHEN 10-325 MG PO TABS
1.0000 | ORAL_TABLET | Freq: Four times a day (QID) | ORAL | Status: DC | PRN
  Administered 2022-03-24 – 2022-03-26 (×6): 1 via ORAL
  Filled 2022-03-24 (×6): qty 1

## 2022-03-24 MED ORDER — LACTATED RINGERS IV SOLN: INTRAVENOUS | Status: DC

## 2022-03-24 MED ORDER — ENOXAPARIN SODIUM 40 MG/0.4ML IJ SOSY
40.0000 mg | PREFILLED_SYRINGE | INTRAMUSCULAR | Status: DC
  Administered 2022-03-24 – 2022-03-25 (×2): 40 mg via SUBCUTANEOUS
  Filled 2022-03-24 (×2): qty 0.4

## 2022-03-24 MED ORDER — ONDANSETRON HCL 4 MG/2ML IJ SOLN
4.0000 mg | Freq: Once | INTRAMUSCULAR | Status: AC
  Administered 2022-03-24: 4 mg via INTRAVENOUS
  Filled 2022-03-24: qty 2

## 2022-03-24 MED ORDER — HYDRALAZINE HCL 20 MG/ML IJ SOLN: 5.0000 mg | INTRAMUSCULAR | Status: DC | PRN

## 2022-03-24 MED ORDER — FENTANYL CITRATE PF 50 MCG/ML IJ SOSY
50.0000 ug | PREFILLED_SYRINGE | Freq: Once | INTRAMUSCULAR | Status: AC
  Administered 2022-03-24: 50 ug via INTRAVENOUS
  Filled 2022-03-24: qty 1

## 2022-03-24 MED ORDER — SODIUM CHLORIDE 0.9 % IV SOLN
1.0000 g | Freq: Once | INTRAVENOUS | Status: AC
  Administered 2022-03-24: 1 g via INTRAVENOUS
  Filled 2022-03-24: qty 10

## 2022-03-24 MED ORDER — OXYCODONE-ACETAMINOPHEN 5-325 MG PO TABS
1.0000 | ORAL_TABLET | Freq: Once | ORAL | Status: AC
  Administered 2022-03-24: 1 via ORAL
  Filled 2022-03-24: qty 1

## 2022-03-24 MED ORDER — ACETAMINOPHEN 650 MG RE SUPP: 650.0000 mg | Freq: Four times a day (QID) | RECTAL | Status: DC | PRN

## 2022-03-24 MED ORDER — LEVETIRACETAM 500 MG PO TABS
500.0000 mg | ORAL_TABLET | Freq: Two times a day (BID) | ORAL | Status: DC
  Administered 2022-03-24 – 2022-03-26 (×5): 500 mg via ORAL
  Filled 2022-03-24 (×6): qty 1

## 2022-03-24 MED ORDER — SIMVASTATIN 20 MG PO TABS
10.0000 mg | ORAL_TABLET | Freq: Every day | ORAL | Status: DC
  Administered 2022-03-24 – 2022-03-26 (×3): 10 mg via ORAL
  Filled 2022-03-24 (×3): qty 1

## 2022-03-24 MED ORDER — PANTOPRAZOLE SODIUM 40 MG PO TBEC
40.0000 mg | DELAYED_RELEASE_TABLET | Freq: Every day | ORAL | Status: DC
  Administered 2022-03-24 – 2022-03-26 (×3): 40 mg via ORAL
  Filled 2022-03-24 (×3): qty 1

## 2022-03-24 MED ORDER — NICOTINE 14 MG/24HR TD PT24
14.0000 mg | MEDICATED_PATCH | Freq: Every day | TRANSDERMAL | Status: DC
  Filled 2022-03-24 (×3): qty 1

## 2022-03-24 MED ORDER — ONDANSETRON HCL 4 MG PO TABS: 4.0000 mg | ORAL_TABLET | Freq: Four times a day (QID) | ORAL | Status: DC | PRN

## 2022-03-24 MED ORDER — POLYETHYLENE GLYCOL 3350 17 G PO PACK: 17.0000 g | PACK | Freq: Every day | ORAL | Status: DC | PRN

## 2022-03-24 MED ORDER — ACETAMINOPHEN 325 MG PO TABS: 650.0000 mg | ORAL_TABLET | Freq: Four times a day (QID) | ORAL | Status: DC | PRN

## 2022-03-24 MED ORDER — SODIUM CHLORIDE 0.9 % IV BOLUS
1000.0000 mL | Freq: Once | INTRAVENOUS | Status: AC
  Administered 2022-03-24: 1000 mL via INTRAVENOUS

## 2022-03-24 MED ORDER — FESOTERODINE FUMARATE ER 4 MG PO TB24
4.0000 mg | ORAL_TABLET | Freq: Every day | ORAL | Status: DC
  Administered 2022-03-24 – 2022-03-26 (×3): 4 mg via ORAL
  Filled 2022-03-24 (×3): qty 1

## 2022-03-24 MED ORDER — ALBUTEROL SULFATE (2.5 MG/3ML) 0.083% IN NEBU: 2.5000 mg | INHALATION_SOLUTION | RESPIRATORY_TRACT | Status: DC | PRN

## 2022-03-24 MED ORDER — ONDANSETRON 4 MG PO TBDP
4.0000 mg | ORAL_TABLET | Freq: Once | ORAL | Status: AC
  Administered 2022-03-24: 4 mg via ORAL
  Filled 2022-03-24: qty 1

## 2022-03-24 MED ORDER — ONDANSETRON HCL 4 MG/2ML IJ SOLN
4.0000 mg | Freq: Four times a day (QID) | INTRAMUSCULAR | Status: DC | PRN
  Administered 2022-03-25 (×2): 4 mg via INTRAVENOUS
  Filled 2022-03-24 (×2): qty 2

## 2022-03-24 NOTE — ED Provider Notes (Signed)
[facility] EMERGENCY DEPARTMENT Provider Note   CSN: [id number] Arrival date & time: 03/23/22  1505     History  Chief Complaint  Patient presents with   Emesis   Abdominal Pain    [name] is a 56 y.o. female.  The history is provided by the patient, medical records and a relative. No language interpreter was used.  Emesis Severity:  Severe Duration:  7 days Timing:  Constant Quality:  Stomach contents Progression:  Unchanged Chronicity:  New Relieved by:  Nothing Worsened by:  Nothing Associated symptoms: abdominal pain and chills   Associated symptoms: no cough, no diarrhea, no fever, no headaches, no sore throat and no URI   Abdominal Pain Pain location:  Generalized Pain quality: aching and cramping   Pain radiates to:  L flank, R flank and suprapubic region Pain severity:  Moderate Onset quality:  Gradual Timing:  Intermittent Progression:  Waxing and waning Chronicity:  New Context: not sick contacts and not trauma   Relieved by:  Nothing Worsened by:  Palpation Ineffective treatments:  None tried Associated symptoms: chills, fatigue, nausea and vomiting   Associated symptoms: no chest pain (none currently), no constipation, no cough, no diarrhea, no fever, no shortness of breath, no sore throat, no vaginal bleeding and no vaginal discharge   Risk factors: not pregnant        Home Medications Prior to Admission medications   Medication Sig Start Date End Date Taking? Authorizing Provider  BIOTIN 5000 PO Take 1 tablet by mouth daily.    [provider]  carisoprodol (SOMA) 350 MG tablet Take 350 mg by mouth daily as needed. 04/24/19   [provider]  ergocalciferol (VITAMIN D2) 1.25 MG (50000 UT) capsule Take 50,000 Units by mouth once a week.    [provider]  folic acid (FOLVITE) 062 MCG tablet Take 400 mcg by mouth daily.    [provider]  hydrochlorothiazide (HYDRODIURIL) 12.5 MG  tablet Take 12.5 mg by mouth daily. 10/09/15   [provider]  HYDROcodone-acetaminophen (NORCO) 10-325 MG tablet Take 1 tablet by mouth up to 4 (four) times daily as needed for severe pain 03/16/22     HYDROcodone-acetaminophen (NORCO/VICODIN) 5-325 MG tablet Take 1-2 tablets by mouth every 6 (six) hours as needed. 07/18/17   Law, Bea Graff, PA-C  ibuprofen (ADVIL) 800 MG tablet Take 800 mg by mouth as directed. 04/24/19   [provider]  levETIRAcetam (KEPPRA) 500 MG tablet Take 1 tablet (500 mg total) by mouth 2 (two) times daily. 07/27/12   Reyne Dumas, MD  pantoprazole (PROTONIX) 40 MG tablet Take 40 mg by mouth daily before breakfast.     [provider]  potassium chloride (KLOR-CON) 10 MEQ tablet Take 1 tablet by mouth at bedtime. 01/25/19   [provider]  simvastatin (ZOCOR) 10 MG tablet Take 10 mg by mouth at bedtime. 10/24/15   [provider]      Allergies    Aspirin and Penicillins    Review of Systems   Review of Systems  Constitutional:  Positive for chills and fatigue. Negative for fever.  HENT:  Negative for congestion and sore throat.   Respiratory:  Negative for cough, chest tightness, shortness of breath and wheezing.   Cardiovascular:  Negative for chest pain (none currently), palpitations and leg swelling.  Gastrointestinal:  Positive for abdominal pain, nausea and vomiting. Negative for abdominal distention, constipation and diarrhea.  Genitourinary:  Positive for decreased  urine volume and flank pain. Negative for vaginal bleeding and vaginal discharge.  Musculoskeletal:  Positive for back pain. Negative for neck pain and neck stiffness.  Skin:  Negative for rash.  Neurological:  Positive for light-headedness. Negative for dizziness, speech difficulty, weakness, numbness and headaches.  Psychiatric/Behavioral:  Negative for agitation and confusion.   All other systems reviewed and are negative.   Physical Exam Updated  Vital Signs BP 100/88 (BP Location: Right Arm)   Pulse 100   Temp 98.5 F (36.9 C)   Resp 17   Ht '5\' 4"'$  (1.626 m)   Wt 92.5 kg   SpO2 90%   BMI 35.02 kg/m  Physical Exam Vitals and nursing note reviewed.  Constitutional:      General: She is not in acute distress.    Appearance: She is well-developed. She is not ill-appearing, toxic-appearing or diaphoretic.  HENT:     Head: Normocephalic and atraumatic.     Mouth/Throat:     Mouth: Mucous membranes are dry.  Eyes:     Conjunctiva/sclera: Conjunctivae normal.     Pupils: Pupils are equal, round, and reactive to light.  Cardiovascular:     Rate and Rhythm: Regular rhythm. Tachycardia present.     Heart sounds: No murmur heard. Pulmonary:     Effort: Pulmonary effort is normal. No respiratory distress.     Breath sounds: Normal breath sounds. No wheezing, rhonchi or rales.  Chest:     Chest wall: No tenderness.  Abdominal:     General: Abdomen is flat.     Palpations: Abdomen is soft.     Tenderness: There is abdominal tenderness. There is right CVA tenderness and left CVA tenderness. There is no guarding or rebound.  Musculoskeletal:        General: No swelling or tenderness.     Cervical back: Neck supple. No tenderness.  Skin:    General: Skin is warm and dry.     Capillary Refill: Capillary refill takes less than 2 seconds.     Findings: No erythema or rash.  Neurological:     Mental Status: She is alert. Mental status is at baseline.  Psychiatric:        Mood and Affect: Mood normal.     ED Results / Procedures / Treatments   Labs (all labs ordered are listed, but only abnormal results are displayed) Labs Reviewed  COMPREHENSIVE METABOLIC PANEL - Abnormal; Notable for the following components:      Result Value   CO2 21 (*)    BUN 41 (*)    Creatinine, Ser 1.92 (*)    GFR, Estimated 30 (*)    All other components within normal limits  URINALYSIS, ROUTINE W REFLEX MICROSCOPIC - Abnormal; Notable for the  following components:   Color, Urine AMBER (*)    APPearance HAZY (*)    Ketones, ur 20 (*)    Protein, ur 30 (*)    Leukocytes,Ua MODERATE (*)    Bacteria, UA FEW (*)    All other components within normal limits  RESP PANEL BY RT-PCR (FLU A&B, COVID) ARPGX2  CULTURE, BLOOD (ROUTINE X 2)  CULTURE, BLOOD (ROUTINE X 2)  LIPASE, BLOOD  CBC  MAGNESIUM    EKG None  Radiology CT ABDOMEN PELVIS WO CONTRAST  Result Date: 03/24/2022 CLINICAL DATA:  Acute, nonlocalized abdominal pain EXAM: CT ABDOMEN AND PELVIS WITHOUT CONTRAST TECHNIQUE: Multidetector CT imaging of the abdomen and pelvis was performed following the standard protocol without IV contrast.  RADIATION DOSE REDUCTION: This exam was performed according to the departmental dose-optimization program which includes automated exposure control, adjustment of the mA and/or kV according to patient size and/or use of iterative reconstruction technique. COMPARISON:  12/01/2007 FINDINGS: Lower chest:  No contributory findings. Hepatobiliary: No focal liver abnormality.No evidence of biliary obstruction or stone. Pancreas: Unremarkable. Spleen: Unremarkable. Adrenals/Urinary Tract: Negative adrenals. No hydronephrosis or ureteral stone. 2 mm right lower pole calculus. Punctate left lower pole calculus on coronal reformats. Unremarkable bladder. Stomach/Bowel: No obstruction. No appendicitis. Scattered colonic diverticula. Vascular/Lymphatic: No acute vascular abnormality. Scattered atheromatous calcification of the aorta and iliacs. No mass or adenopathy. Reproductive:Hysterectomy Other: No ascites or pneumoperitoneum. Musculoskeletal: No acute abnormalities. IMPRESSION: 1. No acute finding. 2. Small bilateral renal calculi. 3. Colonic diverticulosis. Electronically Signed   By: Jorje Guild M.D.   On: 03/24/2022 04:15    Procedures Procedures    Medications Ordered in ED Medications  cefTRIAXone (ROCEPHIN) 1 g in sodium chloride 0.9 % 100 mL  IVPB (has no administration in time range)  sodium chloride 0.9 % bolus 1,000 mL (has no administration in time range)  ondansetron (ZOFRAN) injection 4 mg (has no administration in time range)  fentaNYL (SUBLIMAZE) injection 50 mcg (has no administration in time range)  ondansetron (ZOFRAN-ODT) disintegrating tablet 4 mg (4 mg Oral Given 03/24/22 0206)  oxyCODONE-acetaminophen (PERCOCET/ROXICET) 5-325 MG per tablet 1 tablet (1 tablet Oral Given 03/24/22 0221)    ED Course/ Medical Decision Making/ A&P                           Medical Decision Making Amount and/or Complexity of Data Reviewed Labs: ordered.  Risk Prescription drug management. Decision regarding hospitalization.    [name] is a 56 y.o. female with a past medical history significant for seizures, hypertension, chronic back pain, previous hysterectomy, and GERD who presents with 7 days of persistent nausea, vomiting, progressive dehydration, abdominal pain, and urinary changes.  According to patient, she has not been able to tolerate much p.o. intake since last Wednesday and feels she is getting more more dehydrated.  She is feeling fatigued.  She reports she is getting lightheaded when she gets up.  She reports the pain is cramping and soreness in her abdomen both in the lower abdomen as well as going towards her flanks and back.  She reports the back pain is different than her normal chronic back pains.  She reports some chills but no documented fevers at home.  She reports her urine has been stronger smelling and much darker and decreased.  She reports only urinated once yesterday.  She denies any current chest pain, palpitations, or shortness of breath.  Denies any trauma.  Denies any tick exposures.  Denies any new extremity symptoms.  On exam, lungs clear and chest nontender.  No murmur.  Abdomen has some mild diffuse tenderness but also has bilateral CVA tenderness.  Intact pulses in extremities.  Patient has  extremely dry mucous membranes.  She is tachycardic, initial evaluation of blood pressure is around 557 systolic.  She is warm to the touch but is afebrile on arrival.  Patient had work-up starting in triage that reveals AKI and evidence of dehydration with ketones in her urine.  She does have leukocytes and bacteria and given her urinary symptoms and flank pain with nausea and vomiting and chills I am concerned she is developing UTI and pyelonephritis.  She does not have a leukocytosis  and her lipase is normal and her COVID and flu are negative.  Her  CT scan was performed and shows Bilateral kidney stones but no real stones.  No other acute findings seen.   Given her persistent nausea, pain, and new AKI, we will give fluids, pain medicine, nausea medicine, and antibiotics and she will be admitted for AKI in the setting of suspected developing pyelonephritis.  She does not appear septic at this time.  Chart shows that she has tolerated cephalosporins in the past but is allergic to penicillins.  Will give Rocephin.  Patient will be admitted for further management.          Final Clinical Impression(s) / ED Diagnoses Final diagnoses:  Acute pyelonephritis  Abdominal pain, unspecified abdominal location  Bilateral flank pain  AKI (acute kidney injury) ([place])  Nausea and vomiting, unspecified vomiting type  Dehydration    Clinical Impression: 1. Acute pyelonephritis   2. Abdominal pain, unspecified abdominal location   3. Bilateral flank pain   4. AKI (acute kidney injury) ([place])   5. Nausea and vomiting, unspecified vomiting type   6. Dehydration     Disposition: Admit  This note was prepared with assistance of Dragon voice recognition software. Occasional wrong-word or sound-a-like substitutions may have occurred due to the inherent limitations of voice recognition software.     Jayln Madeira, Gwenyth Allegra, MD 03/24/22 1335

## 2022-03-24 NOTE — ED Notes (Signed)
Pt states she is in a lot of pain and was informed she would have a IV put in for pain meds. Triage nurse Ysidro Evert, RN) made aware of pt complaints for IV to receive meds.

## 2022-03-24 NOTE — H&P (Signed)
History and Physical    Patient: [name] EPP:[id number] DOB: February 19, 1966 DOA: 03/23/2022 DOS: the patient was seen and examined on 03/24/2022 PCP: Nolene Ebbs, MD  Patient coming from: Home - lives with son; NOK: Lockie Mola, [phone]   Chief Complaint: n/v  HPI: [name] is a 56 y.o. female with medical history significant of HTN and seizures presenting with n/v. She reports that she developed acute n/v Saturday.  No diarrhea.  She would have ongoing symptoms anytime she tried to eat.  Last emesis was this AM.  +abdominal cramping with retching that has developed into mild chest pain that she thinks is also from retching.  She is having difficulty urinating, although not having specifically urinary symptoms.    ER Course:  N/V x 1 week, light-headed, not making good urine and maybe urinary symptoms.  Chills.  Creatinine normal at baseline, now 1.92.  Not septic but appears to have UTI, ?early pyelo.  Giving IVF, abx, pain meds.     Review of Systems: As mentioned in the history of present illness. All other systems reviewed and are negative. Past Medical History:  Diagnosis Date   Arthritis    Carpal tunnel syndrome    chronoic muscle spasms  & back pain   Chronic back pain    Fibroids    uterine   GERD (gastroesophageal reflux disease)    Hypertension    Seizures ([place])    last seizure 4-5 years ago/on keppra   Single stillborn delivery outcome    Smoker    Past Surgical History:  Procedure Laterality Date   ABDOMINAL HYSTERECTOMY     BACK SURGERY  02/2017   CESAREAN SECTION     1 time   LUMBAR LAMINECTOMY/DECOMPRESSION MICRODISCECTOMY Left 02/03/2017   Procedure: LEFT SIDED LUMBAR 5-SACRUM 1 MICRODISECTOMY;  Surgeon: Phylliss Bob, MD;  Location: [facility];  Service: Orthopedics;  Laterality: Left;  LEFT SIDED LUMBAR 5-SACRUM 1 MICRODISECTOMY;    MYOMECTOMY     TUBAL LIGATION     Social History:  reports that she has been smoking cigarettes.  She has a 15.00 pack-year smoking history. She has never used smokeless tobacco. She reports that she does not drink alcohol and does not use drugs.  Allergies  Allergen Reactions   Aspirin Anaphylaxis, Hives and Swelling    Swelling of lips and mouth    Penicillins Anaphylaxis, Hives and Swelling    Has patient had a PCN reaction causing immediate rash, facial/tongue/throat swelling, SOB or lightheadedness with hypotension: Yes Has patient had a PCN reaction causing severe rash involving mucus membranes or skin necrosis: Yes Has patient had a PCN reaction that required hospitalization No Has patient had a PCN reaction occurring within the last 10 years: No If all of the above answers are "NO", then may proceed with Cephalosporin use.     Family History  Problem Relation Age of Onset   Hypertension Mother    Hypertension Father    Diabetes Mellitus II Father    Diabetes Father    CAD Other    Breast cancer Sister    Hypertension Brother    Hypertension Sister    Hypertension Sister    Hypertension Sister     Prior to Admission medications   Medication Sig Start Date End Date Taking? Authorizing Provider  carisoprodol (SOMA) 350 MG tablet Take 350 mg by mouth daily as needed. 04/24/19  Yes [provider]  folic acid (FOLVITE) 606 MCG tablet Take 400 mcg  by mouth daily.   Yes [provider]  HYDROcodone-acetaminophen (NORCO) 10-325 MG tablet Take 1 tablet by mouth up to 4 (four) times daily as needed for severe pain 03/16/22  Yes   ibuprofen (ADVIL) 800 MG tablet Take 800 mg by mouth as directed. 04/24/19  Yes [provider]  levETIRAcetam (KEPPRA) 500 MG tablet Take 1 tablet (500 mg total) by mouth 2 (two) times daily. 07/27/12  Yes Reyne Dumas, MD  pantoprazole (PROTONIX) 40 MG tablet Take 40 mg by mouth daily before breakfast.    Yes [provider]  potassium chloride (KLOR-CON) 10 MEQ tablet Take 1 tablet by mouth at bedtime. 01/25/19  Yes  [provider]  simvastatin (ZOCOR) 10 MG tablet Take 10 mg by mouth at bedtime. 10/24/15  Yes [provider]  BIOTIN 5000 PO Take 1 tablet by mouth daily. Patient not taking: Reported on 03/24/2022    [provider]  ergocalciferol (VITAMIN D2) 1.25 MG (50000 UT) capsule Take 50,000 Units by mouth once a week.    [provider]  hydrochlorothiazide (HYDRODIURIL) 12.5 MG tablet Take 12.5 mg by mouth daily. 10/09/15   [provider]  HYDROcodone-acetaminophen (NORCO/VICODIN) 5-325 MG tablet Take 1-2 tablets by mouth every 6 (six) hours as needed. 07/18/17   Frederica Kuster, PA-C    Physical Exam: Vitals:   03/24/22 9678 03/24/22 0801 03/24/22 1027 03/24/22 1755  BP: 109/82 110/72 100/88 115/79  Pulse: 93 87 100 87  Resp: '20 16 17 19  '$ Temp:  98.3 F (36.8 C) 98.5 F (36.9 C) 98.9 F (37.2 C)  TempSrc:    Oral  SpO2: 99% 99% 90% 99%  Weight:      Height:       General:  Appears calm and comfortable and is in NAD Eyes:   EOMI, normal lids, iris ENT:  grossly normal hearing, lips & tongue, mildly dry mm; edentulous Neck:  no LAD, masses or thyromegaly Cardiovascular:  RRR, no m/r/g. No LE edema.  Respiratory:   CTA bilaterally with no wheezes/rales/rhonchi.  Normal respiratory effort. Abdomen:  soft, NT, ND Skin:  no rash or induration seen on limited exam Musculoskeletal:  grossly normal tone BUE/BLE, good ROM, no bony abnormality Psychiatric:  blunted mood and affect, speech fluent and appropriate, AOx3 Neurologic:  CN 2-12 grossly intact, moves all extremities in coordinated fashion   Radiological Exams on Admission: Independently reviewed - see discussion in A/P where applicable  CT ABDOMEN PELVIS WO CONTRAST  Result Date: 03/24/2022 CLINICAL DATA:  Acute, nonlocalized abdominal pain EXAM: CT ABDOMEN AND PELVIS WITHOUT CONTRAST TECHNIQUE: Multidetector CT imaging of the abdomen and pelvis was performed following the standard  protocol without IV contrast. RADIATION DOSE REDUCTION: This exam was performed according to the departmental dose-optimization program which includes automated exposure control, adjustment of the mA and/or kV according to patient size and/or use of iterative reconstruction technique. COMPARISON:  12/01/2007 FINDINGS: Lower chest:  No contributory findings. Hepatobiliary: No focal liver abnormality.No evidence of biliary obstruction or stone. Pancreas: Unremarkable. Spleen: Unremarkable. Adrenals/Urinary Tract: Negative adrenals. No hydronephrosis or ureteral stone. 2 mm right lower pole calculus. Punctate left lower pole calculus on coronal reformats. Unremarkable bladder. Stomach/Bowel: No obstruction. No appendicitis. Scattered colonic diverticula. Vascular/Lymphatic: No acute vascular abnormality. Scattered atheromatous calcification of the aorta and iliacs. No mass or adenopathy. Reproductive:Hysterectomy Other: No ascites or pneumoperitoneum. Musculoskeletal: No acute abnormalities. IMPRESSION: 1. No acute finding. 2. Small bilateral renal calculi. 3. Colonic diverticulosis. Electronically Signed  By: Jorje Guild M.D.   On: 03/24/2022 04:15    EKG:not done   Labs on Admission: I have personally reviewed the available labs and imaging studies at the time of the admission.  Pertinent labs:    BUN 41/Creatinine 1.92/GFR 30; normal in 10/2020 Normal CBC COVID/flu negative UA: 20 ketones, moderate LE, 30 protein, few bacteria   Assessment and Plan: Principal Problem:   AKI (acute kidney injury) ([place]) Active Problems:   Seizures ([place])   Hypertension   Nausea and vomiting   Dyslipidemia   Chronic pain   Class 2 obesity due to excess calories with body mass index (BMI) of 35.0 to 35.9 in adult    AKI -Patient without baseline compromised renal function  -Current creatinine is increased at least 1.5 times compared to baseline and presumed to have occurred within the last 7 days -Likely  due to prerenal secondary to severe dehydration -IVF -Follow up renal function by BMP -Avoid ACEI and NSAIDs  -UA appears more consistent with dehydration than infection;   N/V -This is likely viral in nature -Will attempt to provide clear liquids and advance diet as tolerated -Unremarkable CT -If not improving, consider GI consult  HTN -Hold HCTZ in the setting of AKI -Will cover with prn IV hydralazine  Seizures -Continue Keppra  HLD -Continue simvastatin  Chronic pain -I have reviewed this patient in the [place] Controlled Substances Reporting System.  Se is receiving medications from multiple providers and is at particularly high risk of opioid misuse, diversion, or overdose.  -Hold carisoprodol, consider transition to less-habit-forming muscle relaxant -Continue Norco without escalation of pain medication -Opiate withdrawal is also a consideration for her presenting issues  Obesity -Body mass index is 35.02 kg/m..  -Weight loss should be encouraged -Outpatient PCP/bariatric medicine/bariatric surgery f/u encouraged  -Hold Mounjaro  Tobacco dependence -Encourage cessation.   -This was discussed with the patient and should be reviewed on an ongoing basis.   -Patch ordered     Advance Care Planning:   Code Status: Full Code   Consults: None  DVT Prophylaxis: Lovenox  Family Communication: None present; I spoke with her son by telephone after admission  Severity of Illness: The appropriate patient status for this patient is OBSERVATION. Observation status is judged to be reasonable and necessary in order to provide the required intensity of service to ensure the patient's safety. The patient's presenting symptoms, physical exam findings, and initial radiographic and laboratory data in the context of their medical condition is felt to place them at decreased risk for further clinical deterioration. Furthermore, it is anticipated that the patient will be medically stable  for discharge from the hospital within 2 midnights of admission.   Author: Karmen Bongo, MD 03/24/2022 7:07 PM  For on call review [email or web address].

## 2022-03-24 NOTE — Progress Notes (Signed)
Please do you mind not to bring patients back to back. Kennyth Lose is getting two patients from the ED.

## 2022-03-24 NOTE — ED Notes (Signed)
Pt brought back to triage for reassessment.

## 2022-03-25 DIAGNOSIS — G8929 Other chronic pain: Secondary | ICD-10-CM

## 2022-03-25 DIAGNOSIS — E6609 Other obesity due to excess calories: Secondary | ICD-10-CM | POA: Diagnosis present

## 2022-03-25 DIAGNOSIS — Z6835 Body mass index (BMI) 35.0-35.9, adult: Secondary | ICD-10-CM | POA: Diagnosis not present

## 2022-03-25 DIAGNOSIS — R569 Unspecified convulsions: Secondary | ICD-10-CM | POA: Diagnosis present

## 2022-03-25 DIAGNOSIS — N179 Acute kidney failure, unspecified: Secondary | ICD-10-CM | POA: Diagnosis present

## 2022-03-25 DIAGNOSIS — M549 Dorsalgia, unspecified: Secondary | ICD-10-CM | POA: Diagnosis present

## 2022-03-25 DIAGNOSIS — Z886 Allergy status to analgesic agent status: Secondary | ICD-10-CM | POA: Diagnosis not present

## 2022-03-25 DIAGNOSIS — Z803 Family history of malignant neoplasm of breast: Secondary | ICD-10-CM | POA: Diagnosis not present

## 2022-03-25 DIAGNOSIS — Z1152 Encounter for screening for COVID-19: Secondary | ICD-10-CM | POA: Diagnosis not present

## 2022-03-25 DIAGNOSIS — E785 Hyperlipidemia, unspecified: Secondary | ICD-10-CM | POA: Diagnosis present

## 2022-03-25 DIAGNOSIS — Z8249 Family history of ischemic heart disease and other diseases of the circulatory system: Secondary | ICD-10-CM | POA: Diagnosis not present

## 2022-03-25 DIAGNOSIS — Z833 Family history of diabetes mellitus: Secondary | ICD-10-CM | POA: Diagnosis not present

## 2022-03-25 DIAGNOSIS — Z90710 Acquired absence of both cervix and uterus: Secondary | ICD-10-CM | POA: Diagnosis not present

## 2022-03-25 DIAGNOSIS — M1990 Unspecified osteoarthritis, unspecified site: Secondary | ICD-10-CM | POA: Diagnosis present

## 2022-03-25 DIAGNOSIS — E860 Dehydration: Secondary | ICD-10-CM | POA: Diagnosis present

## 2022-03-25 DIAGNOSIS — I10 Essential (primary) hypertension: Secondary | ICD-10-CM | POA: Diagnosis present

## 2022-03-25 DIAGNOSIS — K219 Gastro-esophageal reflux disease without esophagitis: Secondary | ICD-10-CM | POA: Diagnosis present

## 2022-03-25 DIAGNOSIS — R112 Nausea with vomiting, unspecified: Secondary | ICD-10-CM | POA: Diagnosis not present

## 2022-03-25 DIAGNOSIS — Z880 Allergy status to penicillin: Secondary | ICD-10-CM | POA: Diagnosis not present

## 2022-03-25 DIAGNOSIS — Z79899 Other long term (current) drug therapy: Secondary | ICD-10-CM | POA: Diagnosis not present

## 2022-03-25 DIAGNOSIS — F17210 Nicotine dependence, cigarettes, uncomplicated: Secondary | ICD-10-CM | POA: Diagnosis present

## 2022-03-25 LAB — CBC
HCT: 33.7 % — ABNORMAL LOW (ref 36.0–46.0)
Hemoglobin: 11.6 g/dL — ABNORMAL LOW (ref 12.0–15.0)
MCH: 29.6 pg (ref 26.0–34.0)
MCHC: 34.4 g/dL (ref 30.0–36.0)
MCV: 86 fL (ref 80.0–100.0)
Platelets: 326 10*3/uL (ref 150–400)
RBC: 3.92 MIL/uL (ref 3.87–5.11)
RDW: 14.5 % (ref 11.5–15.5)
WBC: 6.6 10*3/uL (ref 4.0–10.5)
nRBC: 0 % (ref 0.0–0.2)

## 2022-03-25 LAB — BASIC METABOLIC PANEL
Anion gap: 14 (ref 5–15)
BUN: 37 mg/dL — ABNORMAL HIGH (ref 6–20)
CO2: 23 mmol/L (ref 22–32)
Calcium: 9.3 mg/dL (ref 8.9–10.3)
Chloride: 102 mmol/L (ref 98–111)
Creatinine, Ser: 1.42 mg/dL — ABNORMAL HIGH (ref 0.44–1.00)
GFR, Estimated: 43 mL/min — ABNORMAL LOW (ref >60)
Glucose, Bld: 84 mg/dL (ref 70–99)
Potassium: 3.8 mmol/L (ref 3.5–5.1)
Sodium: 139 mmol/L (ref 135–145)

## 2022-03-25 LAB — HIV ANTIBODY (ROUTINE TESTING W REFLEX): HIV Screen 4th Generation wRfx: NONREACTIVE

## 2022-03-25 MED ORDER — SODIUM CHLORIDE 0.9 % IV SOLN
12.5000 mg | Freq: Three times a day (TID) | INTRAVENOUS | Status: DC | PRN
  Administered 2022-03-25: 12.5 mg via INTRAVENOUS
  Filled 2022-03-25: qty 0.5

## 2022-03-25 MED ORDER — POTASSIUM CHLORIDE IN NACL 20-0.9 MEQ/L-% IV SOLN
INTRAVENOUS | Status: DC
  Filled 2022-03-25 (×3): qty 1000

## 2022-03-25 NOTE — Assessment & Plan Note (Addendum)
Continue Keppra.

## 2022-03-25 NOTE — Assessment & Plan Note (Signed)
Due to dehydration.  Cr improving, but unable to tolerate PO at all today - Continue IV fluids - Continue antiemetics - ADAT

## 2022-03-25 NOTE — TOC Initial Note (Signed)
Transition of Care [facility]) - Initial/Assessment Note    Patient Details  Name: [name] MRN: [id number] Date of Birth: [birth year]  Transition of Care [facility]) CM/SW Contact:    Curlene Labrum, RN Phone Number: 03/25/2022, 10:57 AM  Clinical Narrative:                 CM met with the patient at the bedside to discuss transitions of care needs - Adm for AKI and dehydration.  Medicare Observation letter provided to the patient.  The patient lives with her son at the home.  Transportation is provided through Medicare/Medicaid.  Family plans to provide transportation to home by car when she is discharged.  The patient states that her shower chair is broken at home and needs a new one - DME order placed and [employer] called to provide a shower chair to be delivered to the hospital room prior to discharge to home.  The patient quit smoking 1 week ago prior to admission to the hospital - smoking cessation provided in the discharge instructions.  CM will continue to follow the patient for discharge needs for home - no discharge date pending at this time.  Expected Discharge Plan: Home/Self Care Barriers to Discharge: Continued Medical Work up   Patient Goals and CMS Choice Patient states their goals for this hospitalization and ongoing recovery are:: To return home CMS Medicare.gov Compare Post Acute Care list provided to:: Patient Choice offered to / list presented to : Patient  Expected Discharge Plan and Services Expected Discharge Plan: Home/Self Care   Discharge Planning Services: CM Consult Post Acute Care Choice: Durable Medical Equipment (Needs new shower chair) Living arrangements for the past 2 months: Apartment                 DME Arranged: Shower stool DME Agency: [employer] Date DME Agency Contacted: 03/25/22 Time DME Agency Contacted: 23 Representative spoke with at DME Agency: Brenton Grills, Rosa with [employer]            Prior Living  Arrangements/Services Living arrangements for the past 2 months: Apartment Lives with:: Adult Children Patient language and need for interpreter reviewed:: Yes Do you feel safe going back to the place where you live?: Yes      Need for Family Participation in Patient Care: Yes (Comment) Care giver support system in place?: Yes (comment) Current home services: DME (Cane, shower chair - needs new one - [employer] called) Criminal Activity/Legal Involvement Pertinent to Current Situation/Hospitalization: No - Comment as needed  Activities of Daily Living Home Assistive Devices/Equipment: Cane (specify quad or straight) ADL Screening (condition at time of admission) Patient's cognitive ability adequate to safely complete daily activities?: Yes Is the patient deaf or have difficulty hearing?: No Does the patient have difficulty seeing, even when wearing glasses/contacts?: No Does the patient have difficulty concentrating, remembering, or making decisions?: No Patient able to express need for assistance with ADLs?: Yes Does the patient have difficulty dressing or bathing?: No Independently performs ADLs?: Yes (appropriate for developmental age) Does the patient have difficulty walking or climbing stairs?: Yes Weakness of Legs: Both Weakness of Arms/Hands: None  Permission Sought/Granted Permission sought to share information with : Case Manager, Family Supports, PCP       Permission granted to share info w AGENCY: [employer] for new shower chair  Permission granted to share info w Relationship: son, family to provide transportation to home     Emotional Assessment Appearance:: Appears stated age Attitude/Demeanor/Rapport:  Gracious Affect (typically observed): Accepting Orientation: : Oriented to Self, Oriented to Place, Oriented to  Time, Oriented to Situation Alcohol / Substance Use: Tobacco Use (Quit smoking 1 week ago - smoking cessation provided) Psych Involvement: No  (comment)  Admission diagnosis:  Dehydration [E86.0] Bilateral flank pain [R10.9] AKI (acute kidney injury) ([place]) [N17.9] Acute pyelonephritis [N10] Abdominal pain, unspecified abdominal location [R10.9] Nausea and vomiting, unspecified vomiting type [R11.2] Patient Active Problem List   Diagnosis Date Noted   AKI (acute kidney injury) ([place]) 03/24/2022   Nausea and vomiting 03/24/2022   Dyslipidemia 03/24/2022   Chronic pain 03/24/2022   Class 2 obesity due to excess calories with body mass index (BMI) of 35.0 to 35.9 in adult 03/24/2022   Other convulsions 07/26/2012   Seizures ([place]) 07/26/2012   Hypertension 07/26/2012   Arthritis 07/26/2012   Tobacco abuse 07/26/2012   Anemia 07/26/2012   PCP:  Nolene Ebbs, MD Pharmacy:   [facility] [place], [address] [place] [address] Phone: [phone] Fax: [phone]  [place] [address] [place] [address] Phone: [phone] Fax: [phone]     Social Determinants of Health (SDOH) Interventions    Readmission Risk Interventions     No data to display

## 2022-03-25 NOTE — Progress Notes (Signed)
  Progress Note   Patient: [name] TKP:[id number] DOB: 22-Nov-1965 DOA: 03/23/2022     0 DOS: the patient was seen and examined on 03/25/2022 at 11:55AM      Brief hospital course: [name] is a 56 y.o. F with prior history seizures and HTN who presneted with 2-3 days nausea, vomiting and cramping.   10/31: In the ER, Creatinine 1.9 from baseline 0.8, admitted on IV fluids     Assessment and Plan: * AKI (acute kidney injury) ([place]) Due to dehydration.  Cr improving, but unable to tolerate PO at all today - Continue IV fluids - Continue antiemetics - ADAT    Nausea and vomiting Unclear cause.  CT abdomen and pelvis unremarkable. - Continue antiemetics, IV fluids  Obesity (BMI 30-39.9) BMI 34.9  Chronic pain - Continue home Norco  Dyslipidemia - Continue simvastatin  Hypertension BP normal off meds - Hold HCTZ  Seizures (HCC) - Continue Keppra          Subjective: Vomited this morning, then vomited with lunch.  Unable to drink more than a few sips of ginger ale.  No fever, bloating, diarrhea.  She has some abdominal soreness from repeated vomiting.     Physical Exam: BP 107/68 (BP Location: Left Arm)   Pulse 87   Temp (!) 97.4 F (36.3 C) (Oral)   Resp 16   Ht '5\' 4"'$  (1.626 m)   Wt 92.3 kg   SpO2 99%   BMI 34.93 kg/m   Adult female, lying in bed, appears uncomfortable RRR, no murmurs, no peripheral edema Mucous membranes dry, no oral lesions Respiratory rate normal, lungs clear without rales or wheezes Abdomen with mild diffuse tenderness, voluntary guarding noted Attention normal, affect appropriate, judgment insight appear normal    Data Reviewed: Creatinine improved to 1.4, sodium potassium normal CT of the abdomen pelvis without contrast was received, no significant findings Complete blood count normal Urinalysis with bland casts only COVID-Negative  Family Communication: None present    Disposition: Status is:  Inpatient The patient was admitted with nausea and vomiting.  To this point she is still unable to tolerate anything by mouth, has vomited breakfast and lunch, and her creatinine is improving with fluids, but not back to baseline and she is unable to tolerate oral fluids so we will need to continue IV fluids and maintain in the hospital        Author: Edwin Dada, MD 03/25/2022 4:12 PM  For on call review [email or web address].

## 2022-03-25 NOTE — Assessment & Plan Note (Signed)
BP normal off meds - Hold HCTZ

## 2022-03-25 NOTE — Progress Notes (Signed)
Mobility Specialist Progress Note:   03/25/22 1512  Mobility  Activity Ambulated with assistance in hallway  Level of Assistance Contact guard assist, steadying assist  Assistive Device Front wheel walker  Distance Ambulated (ft) 50 ft  Activity Response Tolerated well  Mobility Referral Yes  $Mobility charge 1 Mobility   Pt received in bed and agreeable. C/o dizziness and lightheadedness upon standing, worsened with ambulation. Pt deferred further mobility d/t symptoms. Pt left in bed with all needs met and call bell in reach.   [name] Mobility Specialist-Acute Rehab Secure Chat only

## 2022-03-25 NOTE — Care Management Obs Status (Cosign Needed)
[place] NOTIFICATION   Patient Details  Name: [name] MRN: [id number] Date of Birth: [birth year]   Medicare Observation Status Notification Given:  Yes    Curlene Labrum, RN 03/25/2022, 10:35 AM

## 2022-03-25 NOTE — Assessment & Plan Note (Signed)
Continue simvastatin. 

## 2022-03-25 NOTE — Assessment & Plan Note (Signed)
Unclear cause.  CT abdomen and pelvis unremarkable. - Continue antiemetics, IV fluids

## 2022-03-25 NOTE — Hospital Course (Addendum)
[name] is a 56 y.o. F with prior history seizures and HTN who presneted with 2-3 days nausea, vomiting and cramping.   10/31: In the ER, Creatinine 1.9 from baseline 0.8, admitted on IV fluids

## 2022-03-25 NOTE — Assessment & Plan Note (Signed)
BMI 34.9

## 2022-03-25 NOTE — Assessment & Plan Note (Signed)
-   Continue home [place]

## 2022-03-26 ENCOUNTER — Other Ambulatory Visit (HOSPITAL_COMMUNITY): Payer: Self-pay

## 2022-03-26 DIAGNOSIS — G8929 Other chronic pain: Secondary | ICD-10-CM | POA: Diagnosis not present

## 2022-03-26 DIAGNOSIS — E785 Hyperlipidemia, unspecified: Secondary | ICD-10-CM | POA: Diagnosis not present

## 2022-03-26 DIAGNOSIS — N179 Acute kidney failure, unspecified: Secondary | ICD-10-CM | POA: Diagnosis not present

## 2022-03-26 DIAGNOSIS — R112 Nausea with vomiting, unspecified: Secondary | ICD-10-CM | POA: Diagnosis not present

## 2022-03-26 DIAGNOSIS — E669 Obesity, unspecified: Secondary | ICD-10-CM

## 2022-03-26 LAB — CBC WITH DIFFERENTIAL/PLATELET
Abs Immature Granulocytes: 0.01 10*3/uL (ref 0.00–0.07)
Basophils Absolute: 0 10*3/uL (ref 0.0–0.1)
Basophils Relative: 0 %
Eosinophils Absolute: 0.1 10*3/uL (ref 0.0–0.5)
Eosinophils Relative: 1 %
HCT: 32.4 % — ABNORMAL LOW (ref 36.0–46.0)
Hemoglobin: 10.4 g/dL — ABNORMAL LOW (ref 12.0–15.0)
Immature Granulocytes: 0 %
Lymphocytes Relative: 56 %
Lymphs Abs: 3.2 10*3/uL (ref 0.7–4.0)
MCH: 28.6 pg (ref 26.0–34.0)
MCHC: 32.1 g/dL (ref 30.0–36.0)
MCV: 89 fL (ref 80.0–100.0)
Monocytes Absolute: 0.5 10*3/uL (ref 0.1–1.0)
Monocytes Relative: 8 %
Neutro Abs: 2.1 10*3/uL (ref 1.7–7.7)
Neutrophils Relative %: 35 %
Platelets: 296 10*3/uL (ref 150–400)
RBC: 3.64 MIL/uL — ABNORMAL LOW (ref 3.87–5.11)
RDW: 14.3 % (ref 11.5–15.5)
WBC: 5.8 10*3/uL (ref 4.0–10.5)
nRBC: 0 % (ref 0.0–0.2)

## 2022-03-26 LAB — COMPREHENSIVE METABOLIC PANEL
ALT: 17 U/L (ref 0–44)
AST: 22 U/L (ref 15–41)
Albumin: 2.8 g/dL — ABNORMAL LOW (ref 3.5–5.0)
Alkaline Phosphatase: 51 U/L (ref 38–126)
Anion gap: 10 (ref 5–15)
BUN: 25 mg/dL — ABNORMAL HIGH (ref 6–20)
CO2: 22 mmol/L (ref 22–32)
Calcium: 8.3 mg/dL — ABNORMAL LOW (ref 8.9–10.3)
Chloride: 107 mmol/L (ref 98–111)
Creatinine, Ser: 1.2 mg/dL — ABNORMAL HIGH (ref 0.44–1.00)
GFR, Estimated: 53 mL/min — ABNORMAL LOW (ref >60)
Glucose, Bld: 93 mg/dL (ref 70–99)
Potassium: 3.6 mmol/L (ref 3.5–5.1)
Sodium: 139 mmol/L (ref 135–145)
Total Bilirubin: 0.4 mg/dL (ref 0.3–1.2)
Total Protein: 6 g/dL — ABNORMAL LOW (ref 6.5–8.1)

## 2022-03-26 LAB — MAGNESIUM: Magnesium: 1.6 mg/dL — ABNORMAL LOW (ref 1.7–2.4)

## 2022-03-26 MED ORDER — PROMETHAZINE HCL 25 MG PO TABS
25.0000 mg | ORAL_TABLET | Freq: Four times a day (QID) | ORAL | 0 refills | Status: AC | PRN
  Filled 2022-03-26: qty 30, 8d supply, fill #0

## 2022-03-26 MED ORDER — MAGNESIUM SULFATE 2 GM/50ML IV SOLN
2.0000 g | Freq: Once | INTRAVENOUS | Status: AC
  Administered 2022-03-26: 2 g via INTRAVENOUS
  Filled 2022-03-26: qty 50

## 2022-03-26 MED ORDER — PROMETHAZINE HCL 6.25 MG/5ML PO SYRP
25.0000 mg | ORAL_SOLUTION | Freq: Three times a day (TID) | ORAL | 1 refills | Status: AC | PRN
  Filled 2022-03-26: qty 120, 2d supply, fill #0

## 2022-03-26 NOTE — Progress Notes (Signed)
Mobility Specialist Progress Note   03/26/22 1320  Mobility  Activity Ambulated with assistance in hallway  Level of Assistance Standby assist, set-up cues, supervision of patient - no hands on  Assistive Device Front wheel walker  Distance Ambulated (ft) 60 ft  Range of Motion/Exercises Active;All extremities  Activity Response Tolerated well   Patient received in supine, agreeable to participate. Required minimal HHA to EOB + increased time. Ambulated min guard to supervision with slow steady gait. Returned to room without complaint or incident. Was left in supine with all needs met, call bell in reach.   [place] Everrett Lacasse, [place], [place]  Office: [phone]

## 2022-03-26 NOTE — Plan of Care (Signed)

## 2022-03-26 NOTE — Discharge Summary (Signed)
Physician Discharge Summary   Patient: [name] MRN: [id number] DOB: [birth year]  Admit date:     03/23/2022  Discharge date: 03/26/22  Discharge Physician: Edwin Dada   PCP: Nolene Ebbs, MD     Recommendations at discharge:  Follow up with Dr. Jeanie Cooks in 1 week Dr. Jeanie Cooks: Please check BMP in 1 week and restart HCTZ if needed Please review Mounjaro given vomiting with higher dose      Discharge Diagnoses: Principal Problem:   AKI (acute kidney injury)  Active Problems:   Nausea and vomiting   Seizures ([place])   Hypertension   Dyslipidemia   Chronic pain   Obesity (BMI 30-39.9)   Hypomagnesemia     Hospital Course: [name] is a 56 y.o. F with prior history seizures and HTN who presneted with 2-3 days nausea, vomiting and cramping.   In the ER, Creatinine 1.9 from baseline 0.8, admitted on IV fluids   * AKI (acute kidney injury) ([place]) Due to dehydration due to nausea and vomiting.  Admitted and started on fluids.  Cr improved to near baseline, patient was started on oral diet and tolerated soft diet well without vomiting.  Discharged to follow up with PCP with repeat labs in 1 week.     Nausea and vomiting CT abdomen and pelvis unremarkable.  Had recently increased dose of Mounjaro.   - Stop Mounjaro for now and follow up with PCP   Obesity (BMI 30-39.9) BMI 34.9           The [place] Controlled Substances Registry was reviewed for this patient prior to discharge.  Consultants: None Procedures performed: CT abdomen and pelvix  Disposition: Home   DISCHARGE MEDICATION: Allergies as of 03/26/2022       Reactions   Aspirin Anaphylaxis, Hives, Swelling   Swelling of lips and mouth    Penicillins Anaphylaxis, Hives, Swelling   Has patient had a PCN reaction causing immediate rash, facial/tongue/throat swelling, SOB or lightheadedness with hypotension: Yes Has patient had a PCN reaction causing severe rash  involving mucus membranes or skin necrosis: Yes Has patient had a PCN reaction that required hospitalization No Has patient had a PCN reaction occurring within the last 10 years: No If all of the above answers are "NO", then may proceed with Cephalosporin use.        Medication List     STOP taking these medications    hydrochlorothiazide 25 MG tablet Commonly known as: HYDRODIURIL   ibuprofen 800 MG tablet Commonly known as: ADVIL   Mounjaro 5 MG/0.5ML Pen Generic drug: tirzepatide   potassium chloride 10 MEQ tablet Commonly known as: KLOR-CON       TAKE these medications    BIOTIN 5000 PO Take 1 tablet by mouth daily.   carisoprodol 350 MG tablet Commonly known as: SOMA Take 350 mg by mouth daily as needed for muscle spasms.   diclofenac Sodium 1 % Gel Commonly known as: VOLTAREN Apply 1 Application topically as needed for moderate pain.   ergocalciferol 1.25 MG (50000 UT) capsule Commonly known as: VITAMIN D2 Take 50,000 Units by mouth once a week.   folic acid 801 MCG tablet Commonly known as: FOLVITE Take 400 mcg by mouth daily.   HYDROcodone-acetaminophen 10-325 MG tablet Commonly known as: NORCO Take 1 tablet by mouth up to 4 (four) times daily as needed for severe pain   levETIRAcetam 500 MG tablet Commonly known as: KEPPRA Take 1 tablet (500 mg total) by mouth 2 (  two) times daily.   lubiprostone 24 MCG capsule Commonly known as: AMITIZA Take 24 mcg by mouth daily as needed for constipation.   pantoprazole 40 MG tablet Commonly known as: PROTONIX Take 40 mg by mouth daily before breakfast.   promethazine 25 MG tablet Commonly known as: PHENERGAN Take 1 tablet (25 mg total) by mouth every 6 (six) hours as needed. What changed: reasons to take this   promethazine 6.25 MG/5ML syrup Commonly known as: PHENERGAN Take 20 mLs (25 mg total) by mouth every 8 (eight) hours as needed for nausea or vomiting. What changed: You were already taking a  medication with the same name, and this prescription was added. Make sure you understand how and when to take each.   simvastatin 10 MG tablet Commonly known as: ZOCOR Take 10 mg by mouth at bedtime.   solifenacin 10 MG tablet Commonly known as: VESICARE Take 10 mg by mouth daily.               Durable Medical Equipment  (From admission, onward)           Start     Ordered   03/25/22 1049  For home use only DME Shower stool  Once        03/25/22 1048            Follow-up Information     Nolene Ebbs, MD. Schedule an appointment as soon as possible for a visit.   Specialty: Internal Medicine Contact information: [place] [address] [phone]         Care, [place] Follow up.   Why: [employer] will provide a shower chair for home and will deliver to your hospital room before you are discharged home from the hospital Contact information: [address] [place] [address] [phone]                 Discharge Instructions     Discharge instructions   Complete by: As directed    **IMPORTANT DISCHARGE INSTRUCTIONS**    From Dr. Loleta Books: You were admitted for nausea and vomiting. Here, we found that you had some mild kidney injury  You were treated with fluids and the kidney issue resolved.  Here, we held your HCTZ/hydrochlorothiazide (blood pressure medicine) as this can sometimes dehydrate people  When you go home, you should wait until you see Dr. Jeanie Cooks before you start this  See him in one week and ask him to check your labs  ALSO: STOP Mounjaro for now [facility] can cause vomiting and nausea, and so don't take this for a little while  Then go see the doctor that prescribed it (or call them) and ask if it couldb e the cause of you being admitted for nausea and vomiting and if they recommend you try to restart it or not   Increase activity slowly   Complete by: As directed        Discharge  Exam: [place] [employer]   03/23/22 1630 03/24/22 2013  Weight: 92.5 kg 92.3 kg    General: Pt is alert, awake, not in acute distress Cardiovascular: RRR, nl S1-S2, no murmurs appreciated.   No LE edema.   Respiratory: Normal respiratory rate and rhythm.  CTAB without rales or wheezes. Abdominal: Abdomen soft and non-tender.  No distension or HSM.   Neuro/Psych: Strength symmetric in upper and lower extremities.  Judgment and insight appear normal.   Condition at discharge: good  The results of significant diagnostics from  this hospitalization (including imaging, microbiology, ancillary and laboratory) are listed below for reference.   Imaging Studies: CT ABDOMEN PELVIS WO CONTRAST  Result Date: 03/24/2022 CLINICAL DATA:  Acute, nonlocalized abdominal pain EXAM: CT ABDOMEN AND PELVIS WITHOUT CONTRAST TECHNIQUE: Multidetector CT imaging of the abdomen and pelvis was performed following the standard protocol without IV contrast. RADIATION DOSE REDUCTION: This exam was performed according to the departmental dose-optimization program which includes automated exposure control, adjustment of the mA and/or kV according to patient size and/or use of iterative reconstruction technique. COMPARISON:  12/01/2007 FINDINGS: Lower chest:  No contributory findings. Hepatobiliary: No focal liver abnormality.No evidence of biliary obstruction or stone. Pancreas: Unremarkable. Spleen: Unremarkable. Adrenals/Urinary Tract: Negative adrenals. No hydronephrosis or ureteral stone. 2 mm right lower pole calculus. Punctate left lower pole calculus on coronal reformats. Unremarkable bladder. Stomach/Bowel: No obstruction. No appendicitis. Scattered colonic diverticula. Vascular/Lymphatic: No acute vascular abnormality. Scattered atheromatous calcification of the aorta and iliacs. No mass or adenopathy. Reproductive:Hysterectomy Other: No ascites or pneumoperitoneum. Musculoskeletal: No acute abnormalities. IMPRESSION: 1. No  acute finding. 2. Small bilateral renal calculi. 3. Colonic diverticulosis. Electronically Signed   By: Jorje Guild M.D.   On: 03/24/2022 04:15    Microbiology: Results for orders placed or performed during the hospital encounter of 03/23/22  Resp Panel by RT-PCR (Flu A&B, Covid) Anterior Nasal Swab     Status: None   Collection Time: 03/23/22  4:33 PM   Specimen: Anterior Nasal Swab  Result Value Ref Range Status   SARS Coronavirus 2 by RT PCR NEGATIVE NEGATIVE Final    Comment: (NOTE) SARS-CoV-2 target nucleic acids are NOT DETECTED.  The SARS-CoV-2 RNA is generally detectable in upper respiratory specimens during the acute phase of infection. The lowest concentration of SARS-CoV-2 viral copies this assay can detect is 138 copies/mL. A negative result does not preclude SARS-Cov-2 infection and should not be used as the sole basis for treatment or other patient management decisions. A negative result may occur with  improper specimen collection/handling, submission of specimen other than nasopharyngeal swab, presence of viral mutation(s) within the areas targeted by this assay, and inadequate number of viral copies(<138 copies/mL). A negative result must be combined with clinical observations, patient history, and epidemiological information. The expected result is Negative.  Fact Sheet for Patients:  EntrepreneurPulse.com.au  Fact Sheet for Healthcare Providers:  IncredibleEmployment.be  This test is no t yet approved or cleared by the [place] FDA and  has been authorized for detection and/or diagnosis of SARS-CoV-2 by FDA under an Emergency Use Authorization (EUA). This EUA will remain  in effect (meaning this test can be used) for the duration of the COVID-19 declaration under Section 564(b)(1) of the Act, 21 U.S.C.section 360bbb-3(b)(1), unless the authorization is terminated  or revoked sooner.       Influenza A by PCR  NEGATIVE NEGATIVE Final   Influenza B by PCR NEGATIVE NEGATIVE Final    Comment: (NOTE) The Xpert Xpress SARS-CoV-2/FLU/RSV plus assay is intended as an aid in the diagnosis of influenza from Nasopharyngeal swab specimens and should not be used as a sole basis for treatment. Nasal washings and aspirates are unacceptable for Xpert Xpress SARS-CoV-2/FLU/RSV testing.  Fact Sheet for Patients: EntrepreneurPulse.com.au  Fact Sheet for Healthcare Providers: IncredibleEmployment.be  This test is not yet approved or cleared by the [place] FDA and has been authorized for detection and/or diagnosis of SARS-CoV-2 by FDA under an Emergency Use Authorization (EUA). This EUA will remain in effect (meaning this  test can be used) for the duration of the COVID-19 declaration under Section 564(b)(1) of the Act, 21 U.S.C. section 360bbb-3(b)(1), unless the authorization is terminated or revoked.  Performed at [facility], [place] [address], [place], [place] [address]   Blood culture (routine x 2)     Status: None (Preliminary result)   Collection Time: 03/24/22 12:14 PM   Specimen: BLOOD  Result Value Ref Range Status   Specimen Description BLOOD LEFT ANTECUBITAL  Final   Special Requests   Final    BOTTLES DRAWN AEROBIC AND ANAEROBIC Blood Culture adequate volume   Culture   Final    NO GROWTH 2 DAYS Performed at [facility], [place] [address], [place], [place] [address]    Report Status PENDING  Incomplete  Blood culture (routine x 2)     Status: None (Preliminary result)   Collection Time: 03/24/22 12:19 PM   Specimen: BLOOD RIGHT HAND  Result Value Ref Range Status   Specimen Description BLOOD RIGHT HAND  Final   Special Requests   Final    BOTTLES DRAWN AEROBIC AND ANAEROBIC Blood Culture adequate volume   Culture   Final    NO GROWTH 2 DAYS Performed at [facility], [place] [address], [place], [place] [address]    Report Status  PENDING  Incomplete    Labs: CBC: Recent Labs  Lab 03/23/22 1700 03/25/22 0549 03/26/22 0415  WBC 7.9 6.6 5.8  NEUTROABS  --   --  2.1  HGB 13.3 11.6* 10.4*  HCT 40.6 33.7* 32.4*  MCV 89.8 86.0 89.0  PLT 376 326 115   Basic Metabolic Panel: Recent Labs  Lab 03/23/22 1700 03/25/22 0549 03/26/22 0415  NA 139 139 139  K 4.7 3.8 3.6  CL 104 102 107  CO2 21* 23 22  GLUCOSE 94 84 93  BUN 41* 37* 25*  CREATININE 1.92* 1.42* 1.20*  CALCIUM 9.6 9.3 8.3*  MG 2.0  --  1.6*   Liver Function Tests: Recent Labs  Lab 03/23/22 1700 03/26/22 0415  AST 16 22  ALT 17 17  ALKPHOS 63 51  BILITOT 1.1 0.4  PROT 7.7 6.0*  ALBUMIN 3.6 2.8*   CBG: No results for input(s): "GLUCAP" in the last 168 hours.  Discharge time spent: approximately 35 minutes spent on discharge counseling, evaluation of patient on day of discharge, and coordination of discharge planning with nursing, social work, pharmacy and case management  Signed: Edwin Dada, MD [facility] 03/26/2022

## 2022-03-27 NOTE — Progress Notes (Signed)
Patient discharged home with relatives. Given discharge packet and IV access removed.Patient reported no concerns at discharge. Left unit in wheelchair accompanied by relatives.

## 2022-03-29 LAB — CULTURE, BLOOD (ROUTINE X 2)
Culture: NO GROWTH
Culture: NO GROWTH
Special Requests: ADEQUATE
Special Requests: ADEQUATE

## 2022-04-06 ENCOUNTER — Other Ambulatory Visit (HOSPITAL_COMMUNITY): Payer: Self-pay

## 2022-04-06 MED ORDER — HYDROCODONE-ACETAMINOPHEN 10-325 MG PO TABS
1.0000 | ORAL_TABLET | Freq: Four times a day (QID) | ORAL | 0 refills | Status: DC | PRN
  Filled 2022-04-06: qty 120, 30d supply, fill #0
  Filled 2022-04-20: qty 105, 26d supply, fill #0
  Filled 2022-04-20: qty 15, 4d supply, fill #0
  Filled ????-??-??: fill #0

## 2022-04-20 ENCOUNTER — Other Ambulatory Visit (HOSPITAL_COMMUNITY): Payer: Self-pay

## 2022-04-22 ENCOUNTER — Other Ambulatory Visit (HOSPITAL_COMMUNITY): Payer: Self-pay

## 2022-04-24 ENCOUNTER — Other Ambulatory Visit (HOSPITAL_COMMUNITY): Payer: Self-pay

## 2022-05-01 ENCOUNTER — Other Ambulatory Visit: Payer: Self-pay | Admitting: Internal Medicine

## 2022-05-02 LAB — BASIC METABOLIC PANEL WITH GFR
BUN: 11 mg/dL (ref 7–25)
CO2: 29 mmol/L (ref 20–32)
Calcium: 9.2 mg/dL (ref 8.6–10.4)
Chloride: 106 mmol/L (ref 98–110)
Creat: 0.82 mg/dL (ref 0.50–1.03)
Glucose, Bld: 91 mg/dL (ref 65–99)
Potassium: 3.2 mmol/L — ABNORMAL LOW (ref 3.5–5.3)
Sodium: 142 mmol/L (ref 135–146)
eGFR: 83 mL/min/{1.73_m2} (ref >=60)

## 2022-05-02 LAB — EXTRA LAV TOP TUBE

## 2022-05-05 ENCOUNTER — Other Ambulatory Visit (HOSPITAL_COMMUNITY): Payer: Self-pay

## 2022-05-05 MED ORDER — HYDROCODONE-ACETAMINOPHEN 10-325 MG PO TABS
1.0000 | ORAL_TABLET | Freq: Four times a day (QID) | ORAL | 0 refills | Status: DC | PRN
  Filled 2022-05-05 – 2022-05-20 (×2): qty 120, 30d supply, fill #0

## 2022-05-20 ENCOUNTER — Other Ambulatory Visit (HOSPITAL_COMMUNITY): Payer: Self-pay

## 2022-06-11 ENCOUNTER — Other Ambulatory Visit (HOSPITAL_COMMUNITY): Payer: Self-pay

## 2022-06-11 MED ORDER — HYDROCODONE-ACETAMINOPHEN 10-325 MG PO TABS
1.0000 | ORAL_TABLET | Freq: Four times a day (QID) | ORAL | 0 refills | Status: DC | PRN
  Filled 2022-06-22: qty 120, 30d supply, fill #0

## 2022-06-22 ENCOUNTER — Other Ambulatory Visit (HOSPITAL_COMMUNITY): Payer: Self-pay

## 2022-09-07 ENCOUNTER — Other Ambulatory Visit: Payer: Self-pay | Admitting: Internal Medicine

## 2022-09-08 LAB — CBC
HCT: 35.7 % (ref 35.0–45.0)
Hemoglobin: 11.5 g/dL — ABNORMAL LOW (ref 11.7–15.5)
MCH: 27.8 pg (ref 27.0–33.0)
MCHC: 32.2 g/dL (ref 32.0–36.0)
MCV: 86.4 fL (ref 80.0–100.0)
MPV: 9.8 fL (ref 7.5–12.5)
Platelets: 376 10*3/uL (ref 140–400)
RBC: 4.13 10*6/uL (ref 3.80–5.10)
RDW: 13.6 % (ref 11.0–15.0)
WBC: 6.5 10*3/uL (ref 3.8–10.8)

## 2022-09-08 LAB — URINE CULTURE
MICRO NUMBER:: 14825238
SPECIMEN QUALITY:: ADEQUATE

## 2022-09-08 LAB — COMPLETE METABOLIC PANEL WITH GFR
AG Ratio: 1.2 (calc) (ref 1.0–2.5)
ALT: 13 U/L (ref 6–29)
AST: 11 U/L (ref 10–35)
Albumin: 3.6 g/dL (ref 3.6–5.1)
Alkaline phosphatase (APISO): 78 U/L (ref 37–153)
BUN: 14 mg/dL (ref 7–25)
CO2: 24 mmol/L (ref 20–32)
Calcium: 8.9 mg/dL (ref 8.6–10.4)
Chloride: 106 mmol/L (ref 98–110)
Creat: 0.94 mg/dL (ref 0.50–1.03)
Globulin: 2.9 g/dL (calc) (ref 1.9–3.7)
Glucose, Bld: 139 mg/dL — ABNORMAL HIGH (ref 65–99)
Potassium: 3.9 mmol/L (ref 3.5–5.3)
Sodium: 140 mmol/L (ref 135–146)
Total Bilirubin: 0.2 mg/dL (ref 0.2–1.2)
Total Protein: 6.5 g/dL (ref 6.1–8.1)
eGFR: 70 mL/min/{1.73_m2} (ref >=60)

## 2022-09-08 LAB — LIPID PANEL
Cholesterol: 163 mg/dL
HDL: 39 mg/dL — ABNORMAL LOW
LDL Cholesterol (Calc): 104 mg/dL — ABNORMAL HIGH
Non-HDL Cholesterol (Calc): 124 mg/dL
Total CHOL/HDL Ratio: 4.2 (calc)
Triglycerides: 108 mg/dL

## 2022-09-08 LAB — TSH: TSH: 1.72 mIU/L (ref 0.40–4.50)

## 2022-09-08 LAB — VITAMIN D 25 HYDROXY (VIT D DEFICIENCY, FRACTURES): Vit D, 25-Hydroxy: 23 ng/mL — ABNORMAL LOW (ref 30–100)

## 2022-09-14 ENCOUNTER — Other Ambulatory Visit: Payer: Self-pay

## 2022-09-14 ENCOUNTER — Emergency Department (HOSPITAL_BASED_OUTPATIENT_CLINIC_OR_DEPARTMENT_OTHER): Payer: 59

## 2022-09-14 ENCOUNTER — Encounter (HOSPITAL_BASED_OUTPATIENT_CLINIC_OR_DEPARTMENT_OTHER): Payer: Self-pay | Admitting: Urology

## 2022-09-14 ENCOUNTER — Inpatient Hospital Stay (HOSPITAL_BASED_OUTPATIENT_CLINIC_OR_DEPARTMENT_OTHER)
Admission: EM | Admit: 2022-09-14 | Discharge: 2022-09-16 | DRG: 322 | Disposition: A | Discharge status: Home / Self Care | Payer: 59 | Attending: Cardiovascular Disease | Admitting: Cardiovascular Disease

## 2022-09-14 DIAGNOSIS — R569 Unspecified convulsions: Secondary | ICD-10-CM | POA: Diagnosis present

## 2022-09-14 DIAGNOSIS — M549 Dorsalgia, unspecified: Secondary | ICD-10-CM | POA: Diagnosis present

## 2022-09-14 DIAGNOSIS — I10 Essential (primary) hypertension: Secondary | ICD-10-CM | POA: Diagnosis present

## 2022-09-14 DIAGNOSIS — Z8249 Family history of ischemic heart disease and other diseases of the circulatory system: Secondary | ICD-10-CM | POA: Diagnosis not present

## 2022-09-14 DIAGNOSIS — Z803 Family history of malignant neoplasm of breast: Secondary | ICD-10-CM

## 2022-09-14 DIAGNOSIS — Z79899 Other long term (current) drug therapy: Secondary | ICD-10-CM

## 2022-09-14 DIAGNOSIS — I214 Non-ST elevation (NSTEMI) myocardial infarction: Principal | ICD-10-CM | POA: Diagnosis present

## 2022-09-14 DIAGNOSIS — R9431 Abnormal electrocardiogram [ECG] [EKG]: Secondary | ICD-10-CM | POA: Diagnosis not present

## 2022-09-14 DIAGNOSIS — F17210 Nicotine dependence, cigarettes, uncomplicated: Secondary | ICD-10-CM | POA: Diagnosis present

## 2022-09-14 DIAGNOSIS — E785 Hyperlipidemia, unspecified: Secondary | ICD-10-CM | POA: Diagnosis present

## 2022-09-14 DIAGNOSIS — Z87892 Personal history of anaphylaxis: Secondary | ICD-10-CM | POA: Diagnosis not present

## 2022-09-14 DIAGNOSIS — G8929 Other chronic pain: Secondary | ICD-10-CM | POA: Diagnosis present

## 2022-09-14 DIAGNOSIS — Z833 Family history of diabetes mellitus: Secondary | ICD-10-CM | POA: Diagnosis not present

## 2022-09-14 DIAGNOSIS — Z90710 Acquired absence of both cervix and uterus: Secondary | ICD-10-CM | POA: Diagnosis not present

## 2022-09-14 DIAGNOSIS — Z716 Tobacco abuse counseling: Secondary | ICD-10-CM | POA: Diagnosis not present

## 2022-09-14 DIAGNOSIS — I2510 Atherosclerotic heart disease of native coronary artery without angina pectoris: Secondary | ICD-10-CM | POA: Diagnosis present

## 2022-09-14 DIAGNOSIS — Z955 Presence of coronary angioplasty implant and graft: Secondary | ICD-10-CM

## 2022-09-14 DIAGNOSIS — Z886 Allergy status to analgesic agent status: Secondary | ICD-10-CM | POA: Diagnosis not present

## 2022-09-14 DIAGNOSIS — E669 Obesity, unspecified: Secondary | ICD-10-CM | POA: Diagnosis present

## 2022-09-14 DIAGNOSIS — K219 Gastro-esophageal reflux disease without esophagitis: Secondary | ICD-10-CM | POA: Diagnosis present

## 2022-09-14 DIAGNOSIS — Z880 Allergy status to penicillin: Secondary | ICD-10-CM | POA: Diagnosis not present

## 2022-09-14 LAB — BASIC METABOLIC PANEL
Anion gap: 8 (ref 5–15)
BUN: 12 mg/dL (ref 6–20)
CO2: 24 mmol/L (ref 22–32)
Calcium: 8.7 mg/dL — ABNORMAL LOW (ref 8.9–10.3)
Chloride: 106 mmol/L (ref 98–111)
Creatinine, Ser: 0.86 mg/dL (ref 0.44–1.00)
GFR, Estimated: >60 mL/min (ref >60)
Glucose, Bld: 108 mg/dL — ABNORMAL HIGH (ref 70–99)
Potassium: 3.5 mmol/L (ref 3.5–5.1)
Sodium: 138 mmol/L (ref 135–145)

## 2022-09-14 LAB — HEPARIN LEVEL (UNFRACTIONATED): Heparin Unfractionated: 0.49 IU/mL (ref 0.30–0.70)

## 2022-09-14 LAB — TROPONIN I (HIGH SENSITIVITY)
Troponin I (High Sensitivity): 299 ng/L (ref <18)
Troponin I (High Sensitivity): 488 ng/L (ref <18)

## 2022-09-14 LAB — D-DIMER, QUANTITATIVE: D-Dimer, Quant: 0.41 ug/mL-FEU (ref 0.00–0.50)

## 2022-09-14 LAB — CBC
HCT: 37.1 % (ref 36.0–46.0)
Hemoglobin: 12.2 g/dL (ref 12.0–15.0)
MCH: 28.1 pg (ref 26.0–34.0)
MCHC: 32.9 g/dL (ref 30.0–36.0)
MCV: 85.5 fL (ref 80.0–100.0)
Platelets: 380 10*3/uL (ref 150–400)
RBC: 4.34 MIL/uL (ref 3.87–5.11)
RDW: 14.3 % (ref 11.5–15.5)
WBC: 7.3 10*3/uL (ref 4.0–10.5)
nRBC: 0 % (ref 0.0–0.2)

## 2022-09-14 MED ORDER — ATORVASTATIN CALCIUM 80 MG PO TABS
80.0000 mg | ORAL_TABLET | Freq: Every day | ORAL | Status: DC
  Administered 2022-09-14 – 2022-09-16 (×3): 80 mg via ORAL
  Filled 2022-09-14 (×3): qty 1

## 2022-09-14 MED ORDER — NITROGLYCERIN IN D5W 200-5 MCG/ML-% IV SOLN
0.0000 ug/min | INTRAVENOUS | Status: DC
  Administered 2022-09-14: 10 ug/min via INTRAVENOUS
  Filled 2022-09-14: qty 250

## 2022-09-14 MED ORDER — PANTOPRAZOLE SODIUM 40 MG PO TBEC
40.0000 mg | DELAYED_RELEASE_TABLET | Freq: Every day | ORAL | Status: DC
  Administered 2022-09-15 – 2022-09-16 (×2): 40 mg via ORAL
  Filled 2022-09-14 (×2): qty 1

## 2022-09-14 MED ORDER — ASPIRIN 81 MG PO TBEC
81.0000 mg | DELAYED_RELEASE_TABLET | Freq: Every day | ORAL | Status: DC
Start: 2022-09-15 — End: 2022-09-14

## 2022-09-14 MED ORDER — HEPARIN (PORCINE) 25000 UT/250ML-% IV SOLN
1050.0000 [IU]/h | INTRAVENOUS | Status: DC
  Administered 2022-09-14: 900 [IU]/h via INTRAVENOUS
  Filled 2022-09-14 (×2): qty 250

## 2022-09-14 MED ORDER — MORPHINE SULFATE (PF) 4 MG/ML IV SOLN
4.0000 mg | Freq: Once | INTRAVENOUS | Status: AC
  Administered 2022-09-14: 4 mg via INTRAVENOUS
  Filled 2022-09-14: qty 1

## 2022-09-14 MED ORDER — CLOPIDOGREL BISULFATE 75 MG PO TABS
300.0000 mg | ORAL_TABLET | Freq: Once | ORAL | Status: AC
  Administered 2022-09-14: 300 mg via ORAL
  Filled 2022-09-14: qty 4

## 2022-09-14 MED ORDER — HEPARIN (PORCINE) 25000 UT/250ML-% IV SOLN
12.0000 [IU]/kg/h | INTRAVENOUS | Status: DC
Start: 2022-09-14 — End: 2022-09-14

## 2022-09-14 MED ORDER — LUBIPROSTONE 24 MCG PO CAPS: 24.0000 ug | ORAL_CAPSULE | Freq: Every day | ORAL | Status: DC | PRN

## 2022-09-14 MED ORDER — POTASSIUM CHLORIDE ER 10 MEQ PO TBCR
20.0000 meq | EXTENDED_RELEASE_TABLET | Freq: Once | ORAL | Status: AC
  Administered 2022-09-14: 20 meq via ORAL
  Filled 2022-09-14 (×2): qty 2

## 2022-09-14 MED ORDER — METOPROLOL TARTRATE 12.5 MG HALF TABLET
12.5000 mg | ORAL_TABLET | Freq: Two times a day (BID) | ORAL | Status: DC
  Administered 2022-09-14 – 2022-09-15 (×3): 12.5 mg via ORAL
  Filled 2022-09-14 (×3): qty 1

## 2022-09-14 MED ORDER — CARISOPRODOL 350 MG PO TABS: 350.0000 mg | ORAL_TABLET | Freq: Every day | ORAL | Status: DC | PRN

## 2022-09-14 MED ORDER — LEVETIRACETAM 500 MG PO TABS
500.0000 mg | ORAL_TABLET | Freq: Two times a day (BID) | ORAL | Status: DC
  Administered 2022-09-14 – 2022-09-16 (×4): 500 mg via ORAL
  Filled 2022-09-14 (×4): qty 1

## 2022-09-14 MED ORDER — IOHEXOL 350 MG/ML SOLN
100.0000 mL | Freq: Once | INTRAVENOUS | Status: AC | PRN
  Administered 2022-09-14: 100 mL via INTRAVENOUS

## 2022-09-14 MED ORDER — FOLIC ACID 1 MG PO TABS
500.0000 ug | ORAL_TABLET | Freq: Every day | ORAL | Status: DC
  Administered 2022-09-15 – 2022-09-16 (×2): 0.5 mg via ORAL
  Filled 2022-09-14 (×2): qty 1

## 2022-09-14 MED ORDER — NITROGLYCERIN 0.4 MG SL SUBL: 0.4000 mg | SUBLINGUAL_TABLET | SUBLINGUAL | Status: DC | PRN

## 2022-09-14 MED ORDER — ONDANSETRON HCL 4 MG/2ML IJ SOLN
4.0000 mg | Freq: Four times a day (QID) | INTRAMUSCULAR | Status: DC | PRN
  Administered 2022-09-15: 4 mg via INTRAVENOUS
  Filled 2022-09-14: qty 2

## 2022-09-14 MED ORDER — HEPARIN BOLUS VIA INFUSION
4000.0000 [IU] | Freq: Once | INTRAVENOUS | Status: AC
  Administered 2022-09-14: 4000 [IU] via INTRAVENOUS

## 2022-09-14 MED ORDER — HEPARIN SODIUM (PORCINE) 5000 UNIT/ML IJ SOLN: 4000.0000 [IU] | Freq: Once | INTRAMUSCULAR | Status: DC

## 2022-09-14 MED ORDER — ACETAMINOPHEN 325 MG PO TABS
650.0000 mg | ORAL_TABLET | ORAL | Status: DC | PRN
  Administered 2022-09-15 – 2022-09-16 (×2): 650 mg via ORAL
  Filled 2022-09-14 (×2): qty 2

## 2022-09-14 MED ORDER — CLOPIDOGREL BISULFATE 75 MG PO TABS
75.0000 mg | ORAL_TABLET | Freq: Every day | ORAL | Status: DC
  Administered 2022-09-15 – 2022-09-16 (×2): 75 mg via ORAL
  Filled 2022-09-14 (×2): qty 1

## 2022-09-14 NOTE — Progress Notes (Signed)
ANTICOAGULATION CONSULT NOTE  Pharmacy Consult for heparin Indication: chest pain/ACS  Allergies  Allergen Reactions   Aspirin Anaphylaxis, Hives and Swelling    Swelling of lips and mouth    Penicillins Anaphylaxis, Hives and Swelling    Has patient had a PCN reaction causing immediate rash, facial/tongue/throat swelling, SOB or lightheadedness with hypotension: Yes Has patient had a PCN reaction causing severe rash involving mucus membranes or skin necrosis: Yes Has patient had a PCN reaction that required hospitalization No Has patient had a PCN reaction occurring within the last 10 years: No If all of the above answers are "NO", then may proceed with Cephalosporin use.     Patient Measurements: Height:  (162.6 cm) Weight: 92.3 kg (203 lb 7.8 oz) IBW/kg (Calculated) : 54.7 Heparin Dosing Weight: 75.6kg  Vital Signs: Temp: 98.9 F (37.2 C) (04/22 1856) Temp Source: Oral (04/22 1856) BP: 120/75 (04/22 1900) Pulse Rate: 86 (04/22 1715)  Labs: Recent Labs    09/14/22 1118 09/14/22 1254 09/14/22 2221  HGB 12.2  --   --   HCT 37.1  --   --   PLT 380  --   --   HEPARINUNFRC  --   --  0.49  CREATININE 0.86  --   --   TROPONINIHS 299* 488*  --      Estimated Creatinine Clearance: 78.5 mL/min (by C-G formula based on SCr of 0.86 mg/dL).   Medical History: Past Medical History:  Diagnosis Date   Arthritis    Carpal tunnel syndrome    chronoic muscle spasms  & back pain   Chronic back pain    Fibroids    uterine   GERD (gastroesophageal reflux disease)    Hypertension    Seizures    last seizure 4-5 years ago/on keppra   Single stillborn delivery outcome    Smoker     Medications:  Infusions:   heparin 900 Units/hr (09/14/22 2145)   nitroGLYCERIN 20 mcg/min (09/14/22 2145)    Assessment: [name] presented to the ED with CP. Troponin elevated and now starting IV heparin. Baseline CBC is WNL and she is not on anticoagulation PTA.   Initial heparin  level therapeutic on 900 units/hr  Goal of Therapy:  Heparin level 0.3-0.7 units/ml Monitor platelets by anticoagulation protocol: Yes   Plan:  Continue heparin gtt at 900 units/hr Daily heparin level, CBC, s/s bleeding  Daylene Posey, PharmD, [facility] Clinical Pharmacist ED Pharmacist Phone # [phone] 09/14/2022 10:48 PM

## 2022-09-14 NOTE — Progress Notes (Signed)
ANTICOAGULATION CONSULT NOTE - Initial Consult  Pharmacy Consult for heparin Indication: chest pain/ACS  Allergies  Allergen Reactions   Aspirin Anaphylaxis, Hives and Swelling    Swelling of lips and mouth    Penicillins Anaphylaxis, Hives and Swelling    Has patient had a PCN reaction causing immediate rash, facial/tongue/throat swelling, SOB or lightheadedness with hypotension: Yes Has patient had a PCN reaction causing severe rash involving mucus membranes or skin necrosis: Yes Has patient had a PCN reaction that required hospitalization No Has patient had a PCN reaction occurring within the last 10 years: No If all of the above answers are "NO", then may proceed with Cephalosporin use.     Patient Measurements: Height:  (162.6 cm) Weight: 92.3 kg (203 lb 7.8 oz) IBW/kg (Calculated) : 54.7 Heparin Dosing Weight: 75.6kg  Vital Signs: Temp: 98.2 F (36.8 C) (04/22 1230) Temp Source: Oral (04/22 1034) BP: 158/104 (04/22 1230) Pulse Rate: 70 (04/22 1230)  Labs: Recent Labs    09/14/22 1118  HGB 12.2  HCT 37.1  PLT 380  CREATININE 0.86  TROPONINIHS 299*    Estimated Creatinine Clearance: 78.5 mL/min (by C-G formula based on SCr of 0.86 mg/dL).   Medical History: Past Medical History:  Diagnosis Date   Arthritis    Carpal tunnel syndrome    chronoic muscle spasms  & back pain   Chronic back pain    Fibroids    uterine   GERD (gastroesophageal reflux disease)    Hypertension    Seizures    last seizure 4-5 years ago/on keppra   Single stillborn delivery outcome    Smoker     Medications:  Infusions:   heparin     nitroGLYCERIN      Assessment: 70 yof presented to the ED with CP. Troponin elevated and now starting IV heparin. Baseline CBC is WNL and she is not on anticoagulation PTA.   Goal of Therapy:  Heparin level 0.3-0.7 units/ml Monitor platelets by anticoagulation protocol: Yes   Plan:  Heparin bolus 4000 units IV x 1 Heparin gtt 900  units/hr Check a 6 hr heparin level Daily heparin level and CBC  Erlean Mealor, Drake Leach 09/14/2022,1:31 PM

## 2022-09-14 NOTE — ED Provider Notes (Signed)
[place] EMERGENCY DEPARTMENT AT [facility] [place] Provider Note   CSN: [id number] Arrival date & time: 09/14/22  1025     History No chief complaint on file.   [name] is a 57 y.o. female with a past medical history of hypertension, hyperlipidemia, tobacco use presenting today with chest pain.  She reports that on Tuesday she was in her water aerobics class and started to feel pain in her chest.  She describes it as pressure and "like something was squeezing" so she got out of the water.  She reports that her blood pressure was elevated at the time so she was monitored at the pool and they called her doctor.  Her PCP asked for her to come to the office that day.  Lab work was run and an EKG was done which were nonconcerning and patient was subsequently diagnosed with a muscle spasm and instructed to continue her muscle relaxants and hydrocodone.  She reports that the pain continued but was better until Thursday when it came back more severe.  She says it is in the middle of her chest and moves to her right shoulder and a little bit in her back.  Worse with any type of movement.  Does report that it is worse with deep inspiration.  No history of DVT/PE, recent travel, recent surgery, leg swelling, estrogen products but does report smoking 6 to 7 cigarettes daily.  Denies a history of ACS.  Does report that she is under increased stress lately because her father is sick and she plans to go see him tonight and she is very concerned that she will not be able to due to her illness today.   HPI     Home Medications Prior to Admission medications   Medication Sig Start Date End Date Taking? Authorizing Provider  BIOTIN 5000 PO Take 1 tablet by mouth daily.    [provider]  carisoprodol (SOMA) 350 MG tablet Take 350 mg by mouth daily as needed for muscle spasms. 04/24/19   [provider]  diclofenac Sodium (VOLTAREN) 1 % GEL Apply 1 Application topically as  needed for moderate pain. 11/09/21   [provider]  ergocalciferol (VITAMIN D2) 1.25 MG (50000 UT) capsule Take 50,000 Units by mouth once a week.    [provider]  folic acid (FOLVITE) 400 MCG tablet Take 400 mcg by mouth daily.    [provider]  HYDROcodone-acetaminophen (NORCO) 10-325 MG tablet Take 1 tablet by mouth up to 4 (four) times daily as needed for severe pain only. 05/05/22     levETIRAcetam (KEPPRA) 500 MG tablet Take 1 tablet (500 mg total) by mouth 2 (two) times daily. 07/27/12   Richarda Overlie, MD  lubiprostone (AMITIZA) 24 MCG capsule Take 24 mcg by mouth daily as needed for constipation. 12/30/21   [provider]  pantoprazole (PROTONIX) 40 MG tablet Take 40 mg by mouth daily before breakfast.     [provider]  promethazine (PHENERGAN) 25 MG tablet Take 1 tablet (25 mg total) by mouth every 6 (six) hours as needed. 03/26/22   Danford, Earl Lites, MD  promethazine (PHENERGAN) 6.25 MG/5ML syrup Take 20 mLs (25 mg total) by mouth every 8 (eight) hours as needed for nausea or vomiting. 03/26/22 03/26/23  DanfordEarl Lites, MD  simvastatin (ZOCOR) 10 MG tablet Take 10 mg by mouth at bedtime. 10/24/15   [provider]  solifenacin (VESICARE) 10 MG tablet Take 10 mg by mouth  daily. 12/09/21   [provider]      Allergies    Aspirin and Penicillins    Review of Systems   Review of Systems  Physical Exam Updated Vital Signs There were no vitals taken for this visit. Physical Exam Vitals and nursing note reviewed.  Constitutional:      General: She is not in acute distress.    Appearance: Normal appearance. She is not ill-appearing.  HENT:     Head: Normocephalic and atraumatic.  Eyes:     General: No scleral icterus.    Conjunctiva/sclera: Conjunctivae normal.  Cardiovascular:     Rate and Rhythm: Normal rate and regular rhythm.  Pulmonary:     Effort: Pulmonary effort is normal. No respiratory  distress.     Breath sounds: No wheezing or rales.  Abdominal:     General: Abdomen is flat.     Palpations: Abdomen is soft.     Tenderness: There is no abdominal tenderness.  Musculoskeletal:        General: Normal range of motion.     Comments: Range of motion of the right shoulder.  Some increase in her pain with flexion and abduction of this shoulder.  Reproducible tenderness over the chest wall and pectoralis muscles  Skin:    Findings: No rash.  Neurological:     Mental Status: She is alert.  Psychiatric:        Mood and Affect: Mood normal.    ED Results / Procedures / Treatments   Labs (all labs ordered are listed, but only abnormal results are displayed) Labs Reviewed  BASIC METABOLIC PANEL  CBC  TROPONIN I (HIGH SENSITIVITY)    EKG EKG Interpretation  Date/Time:  Monday September 14 2022 10:37:56 EDT Ventricular Rate:  76 PR Interval:  52 QRS Duration: 104 QT Interval:  403 QTC Calculation: 454 R Axis:   66 Text Interpretation: Sinus rhythm Short PR interval Biatrial enlargement Nonspecific repol abnormality, diffuse leads Artifact in lead(s) I III aVR aVL Interpretation limited secondary to artifact T waves now upright Confirmed by Glynn Octave [phone]) on 09/14/2022 10:40:40 AM  Radiology DG Chest Portable 1 View  Result Date: 09/14/2022 CLINICAL DATA:  Pain. EXAM: PORTABLE CHEST 1 VIEW COMPARISON:  02/15/2019. FINDINGS: Clear lungs. Normal heart size and mediastinal contours. No pleural effusion or pneumothorax. Visualized bones and upper abdomen are unremarkable. IMPRESSION: No evidence of acute cardiopulmonary disease. Electronically Signed   By: Orvan Falconer M.D.   On: 09/14/2022 11:16    Procedures .Critical Care  Performed by: Saddie Benders, PA-C Authorized by: Saddie Benders, PA-C   Critical care provider statement:    Critical care time (minutes):  60   Critical care time was exclusive of:  Separately billable procedures and treating  other patients   Critical care was necessary to treat or prevent imminent or life-threatening deterioration of the following conditions:  Cardiac failure   Critical care was time spent personally by me on the following activities:  Development of treatment plan with patient or surrogate, discussions with consultants, discussions with primary provider, evaluation of patient's response to treatment, obtaining history from patient or surrogate, re-evaluation of patient's condition, review of old charts, pulse oximetry, ordering and review of radiographic studies, ordering and review of laboratory studies and ordering and performing treatments and interventions   I assumed direction of critical care for this patient from another provider in my specialty: no     Care discussed with: admitting provider and accepting  provider at another facility      Medications Ordered in ED Medications - No data to display  ED Course/ Medical Decision Making/ A&P Clinical Course as of 09/14/22 1359  Mon Sep 14, 2022  1229 Spoke with Dr. Gaynelle Arabian with cardiology.  Plan will be to hold heparin until CT angiogram is done.  Patient is pain-free with morphine but if anything changes I will start nitroglycerin.  Patient will be transferred to the cardiology service at [facility] [MR]  1322 Negative CTA, heparin ordered.  Also complaining of pain so we will place patient on nitro drip at this time [MR]    Clinical Course User Index [MR] Joann Jorge A, PA-C   {   Click here for ABCD2, HEART and other calculatorsREFRESH Note before signing :1}                          Medical Decision Making Amount and/or Complexity of Data Reviewed Labs: ordered. Radiology: ordered.  Risk Prescription drug management. Decision regarding hospitalization.   57 year old female presenting today with for chest pain.  The emergent differential diagnosis of chest pain includes: Acute coronary syndrome, pericarditis, aortic dissection,  pulmonary embolism, tension pneumothorax, and esophageal rupture.  This is not an exhaustive differential.    Past Medical History / Co-morbidities / Social History: Hypertension, dyslipidemia, tobacco use, obesity and chronic pain on Norco   Additional history: Per PDMP review patient filled 120 Vicodin 20 days ago.  Also filled Carisoprodol 2 days ago   Physical Exam: Pertinent physical exam findings include Reproducible tenderness over the right pectoralis muscle.  Pain worsens with flexion and abduction of the shoulder.  Lab Tests: I ordered, and personally interpreted labs.  The pertinent results include: Troponin 299 up trended to 488   Imaging Studies: I ordered and independently visualized and interpreted today and I agree with the radiologist that no abnormalities, including 1 view of patient's shoulder   Cardiac Monitoring:  The patient was maintained on a cardiac monitor.  I viewed and interpreted the cardiac monitored which showed an underlying rhythm of: Sinus   Medications: Morphine resolved her pain   Consultations Obtained: I spoke with Dr. Gaynelle Arabian with cardiology and he will admit the patient to his service.  Will hold heparin until angiogram comes back.  Does not believe she needs Plavix at this time.  We did discuss using nitroglycerin if patient's pain comes back after the morphine.  MDM/Disposition: This is a 57 year old female presenting today with chest discomfort for 6 days.  Originally thought to be a pulled muscle however it has worsened over the past couple of days so she wanted to make sure everything was okay before she left town to see her sick father.  Differential.  Considered dissection.  Patient has equal and symmetric pulses.  No active shortness of breath.  Pain is reproducible.  Not diaphoretic.  Mildly hypertensive but no history of AAA or any other abnormalities on previous CT imaging.  Do not believe this is a likely diagnosis.  Also considered  PE.  Unable to apply PERC criteria due to patient's age however she is low likelihood Wells PE score given her only risk factor being tobacco use.  Despite somewhat pleuritic chest pain patient denies any shortness of breath, dyspnea on exertion or lightheadedness.  Do not believe dimer or angiogram is needed at this time.  Patient's troponin came back at 299.  She has a history of high blood  pressure, hyperlipidemia and a somewhat suspicious story with the pressure and squeezing description.  Heart score 7.  Cardiology was consulted and will admit the patient to their service at [facility].  Patient made aware.   4pm: Bed.  Spoke with Dr. Rodney Langton via secure chat. Patient is still not assigned to a cardiology bed so she will go ED to ED for sooner evaluation. Dr. Doran Durand accepting.  Final Clinical Impression(s) / ED Diagnoses Final diagnoses:  NSTEMI (non-ST elevated myocardial infarction)    Rx / DC Orders ED Discharge Orders     None      I discussed this case with my attending physician Dr. Manus Gunning who cosigned this note including patient's presenting symptoms, physical exam, and planned diagnostics and interventions. Attending physician stated agreement with plan or made changes to plan which were implemented.

## 2022-09-14 NOTE — ED Notes (Signed)
ED TO INPATIENT HANDOFF REPORT  ED Nurse Name and Phone #: Lanora Manis [phone]  S Name/Age/Gender [name] 57 y.o. female Room/Bed: 005C/005C  Code Status   Code Status: Prior  Home/SNF/Other Home Patient oriented to: self, place, time, and situation Is this baseline? Yes   Triage Complete: Triage complete  Chief Complaint NSTEMI (non-ST elevated myocardial infarction) [I21.4]  Triage Note Pt states felt pull/tightness in right side of chest 6 days ago while doing water arobics, state pain now radiating into right shoulder, pain worse with movement of right arm and shoulder  Pain worse with deep breathing    Allergies Allergies  Allergen Reactions   Aspirin Anaphylaxis, Hives and Swelling    Swelling of lips and mouth    Penicillins Anaphylaxis, Hives and Swelling    Has patient had a PCN reaction causing immediate rash, facial/tongue/throat swelling, SOB or lightheadedness with hypotension: Yes Has patient had a PCN reaction causing severe rash involving mucus membranes or skin necrosis: Yes Has patient had a PCN reaction that required hospitalization No Has patient had a PCN reaction occurring within the last 10 years: No If all of the above answers are "NO", then may proceed with Cephalosporin use.     Level of Care/Admitting Diagnosis ED Disposition     ED Disposition  Admit   Condition  --   Comment  Hospital Area: [facility] [[id number]] Level of Care: Telemetry Cardiac [103] May admit patient to [facility] or [facility] if equivalent level of care is available:: No Interfacility transfer: Yes Covid Evaluation: Asymptomatic -  no recent exposure (last 10 days) testing not required Diagnosis: NSTEMI (non-ST elevated myocardial infarction) [454098] Admitting Physician: Little Ishikawa [[id number]] Attending Physician: Glynn Octave [phone] Certification:: I certif y this patient will need inpatient services for at least 2  midnights Estimated Length of Stay: 2          B Medical/Surgery History Past Medical History:  Diagnosis Date   Arthritis    Carpal tunnel syndrome    chronoic muscle spasms  & back pain   Chronic back pain    Fibroids    uterine   GERD (gastroesophageal reflux disease)    Hypertension    Seizures    last seizure 4-5 years ago/on keppra   Single stillborn delivery outcome    Smoker    Past Surgical History:  Procedure Laterality Date   ABDOMINAL HYSTERECTOMY     BACK SURGERY  02/2017   CESAREAN SECTION     1 time   LUMBAR LAMINECTOMY/DECOMPRESSION MICRODISCECTOMY Left 02/03/2017   Procedure: LEFT SIDED LUMBAR 5-SACRUM 1 MICRODISECTOMY;  Surgeon: [name], MD;  Location: [facility] OR;  Service: Orthopedics;  Laterality: Left;  LEFT SIDED LUMBAR 5-SACRUM 1 MICRODISECTOMY;    MYOMECTOMY     TUBAL LIGATION       A IV Location/Drains/Wounds Patient Lines/Drains/Airways Status     Active Line/Drains/Airways     Name Placement date Placement time Site Days   Peripheral IV 09/14/22 20 G Left Antecubital 09/14/22  1117  Antecubital  less than 1   External Urinary Catheter 09/14/22  1838  --  less than 1   Incision (Closed) 02/03/17 Back Other (Comment) 02/03/17  1342  -- 2049            Intake/Output Last 24 hours No intake or output data in the 24 hours ending 09/14/22 2027  Labs/Imaging Results for orders placed or performed during the hospital encounter of 09/14/22 (  from the past 48 hour(s))  Basic metabolic panel     Status: Abnormal   Collection Time: 09/14/22 11:18 AM  Result Value Ref Range   Sodium 138 135 - 145 mmol/L   Potassium 3.5 3.5 - 5.1 mmol/L   Chloride 106 98 - 111 mmol/L   CO2 24 22 - 32 mmol/L   Glucose, Bld 108 (H) 70 - 99 mg/dL    Comment: Glucose reference range applies only to samples taken after fasting for at least 8 hours.   BUN 12 6 - 20 mg/dL   Creatinine, Ser 0.96 0.44 - 1.00 mg/dL   Calcium 8.7 (L) 8.9 - 10.3 mg/dL   GFR,  Estimated >04 >54 mL/min    Comment: (NOTE) Calculated using the CKD-EPI Creatinine Equation (2021)    Anion gap 8 5 - 15    Comment: Performed at [facility], [address] [place], [place] [address]  CBC     Status: None   Collection Time: 09/14/22 11:18 AM  Result Value Ref Range   WBC 7.3 4.0 - 10.5 K/uL   RBC 4.34 3.87 - 5.11 MIL/uL   Hemoglobin 12.2 12.0 - 15.0 g/dL   HCT 91.4 78.2 - 95.6 %   MCV 85.5 80.0 - 100.0 fL   MCH 28.1 26.0 - 34.0 pg   MCHC 32.9 30.0 - 36.0 g/dL   RDW 21.3 08.6 - 57.8 %   Platelets 380 150 - 400 K/uL   nRBC 0.0 0.0 - 0.2 %    Comment: Performed at [facility], [address] [place], [place] [address]  Troponin I (High Sensitivity)     Status: Abnormal   Collection Time: 09/14/22 11:18 AM  Result Value Ref Range   Troponin I (High Sensitivity) 299 (HH) <18 ng/L    Comment: CRITICAL RESULT CALLED TO, READ BACK BY AND VERIFIED WITH A. NOAH RN 09/14/22 1215 MB (NOTE) Elevated high sensitivity troponin I (hsTnI) values and significant  changes across serial measurements may suggest ACS but many other  chronic and acute conditions are known to elevate hsTnI results.  Refer to the "Links" section for chest pain algorithms and additional  guidance. Performed at [facility], [address] [place], [place] [address]   D-dimer, quantitative     Status: None   Collection Time: 09/14/22 12:25 PM  Result Value Ref Range   D-Dimer, Quant 0.41 0.00 - 0.50 ug/mL-FEU    Comment: (NOTE) At the manufacturer cut-off value of 0.5 g/mL FEU, this assay has a negative predictive value of 95-100%.This assay is intended for use in conjunction with a clinical pretest probability (PTP) assessment model to exclude pulmonary embolism (PE) and deep venous thrombosis (DVT) in outpatients suspected of PE or DVT. Results should be correlated with clinical presentation. Performed at [facility], [address]  [place], [place] [address]   Troponin I (High Sensitivity)     Status: Abnormal   Collection Time: 09/14/22 12:54 PM  Result Value Ref Range   Troponin I (High Sensitivity) 488 (HH) <18 ng/L    Comment: CRITICAL RESULT CALLED TO, READ BACK BY AND VERIFIED WITH A NOAH RN 09/14/22 1350 MB (NOTE) Elevated high sensitivity troponin I (hsTnI) values and significant  changes across serial measurements may suggest ACS but many other  chronic and acute conditions are known to elevate hsTnI results.  Refer to the "Links" section for chest pain algorithms and additional  guidance.  Performed at [facility], [address] [place], [place] [address]    CT Angio Chest PE W and/or Wo Contrast  Result Date: 09/14/2022 CLINICAL DATA:  Right chest pain, right shoulder pain EXAM: CT ANGIOGRAPHY CHEST WITH CONTRAST TECHNIQUE: Multidetector CT imaging of the chest was performed using the standard protocol during bolus administration of intravenous contrast. Multiplanar CT image reconstructions and MIPs were obtained to evaluate the vascular anatomy. RADIATION DOSE REDUCTION: This exam was performed according to the departmental dose-optimization program which includes automated exposure control, adjustment of the mA and/or kV according to patient size and/or use of iterative reconstruction technique. CONTRAST:  OMNIPAQUE IOHEXOL 350 MG/ML SOLN COMPARISON:  Chest radiographs done earlier today FINDINGS: Cardiovascular: There is homogeneous enhancement in thoracic aorta. There are no intraluminal filling defects in pulmonary artery branches. Scattered coronary artery calcifications are seen. Mediastinum/Nodes: No significant lymphadenopathy is seen. Lungs/Pleura: There is no focal pulmonary consolidation. Small linear densities in the lower lung fields may suggest minimal scarring or subsegmental atelectasis. There is no pleural effusion or pneumothorax. Upper Abdomen: Small hiatal hernia is seen.  Musculoskeletal: No acute findings are seen. Review of the MIP images confirms the above findings. IMPRESSION: There is no evidence of pulmonary artery embolism. There is no evidence of thoracic aortic dissection. There is no focal pulmonary consolidation. Small hiatal hernia. There are few scattered coronary artery calcifications. Electronically Signed   By: Ernie Avena M.D.   On: 09/14/2022 12:57   DG Chest Portable 1 View  Result Date: 09/14/2022 CLINICAL DATA:  Pain. EXAM: PORTABLE CHEST 1 VIEW COMPARISON:  02/15/2019. FINDINGS: Clear lungs. Normal heart size and mediastinal contours. No pleural effusion or pneumothorax. Visualized bones and upper abdomen are unremarkable. IMPRESSION: No evidence of acute cardiopulmonary disease. Electronically Signed   By: Orvan Falconer M.D.   On: 09/14/2022 11:16    Pending Labs Unresulted Labs (From admission, onward)     Start     Ordered   09/14/22 2000  Heparin level (unfractionated)  Once-Timed,   URGENT        09/14/22 1329            Vitals/Pain Today's Vitals   09/14/22 1800 09/14/22 1815 09/14/22 1830 09/14/22 1856  BP: (!) 126/97 121/86 119/88   Pulse:      Resp: Temp:    98.9 F (37.2 C)  TempSrc:    Oral  SpO2:      Weight:      Height:      PainSc:        Isolation Precautions No active isolations  Medications Medications  nitroGLYCERIN 50 mg in dextrose 5 % 250 mL (0.2 mg/mL) infusion (15 mcg/min Intravenous Rate/Dose Change 09/14/22 1443)  heparin ADULT infusion 100 units/mL (25000 units/230mL) (900 Units/hr Intravenous New Bag/Given 09/14/22 1402)  morphine (PF) 4 MG/ML injection 4 mg (4 mg Intravenous Given 09/14/22 1121)  iohexol (OMNIPAQUE) 350 MG/ML injection 100 mL (100 mLs Intravenous Contrast Given 09/14/22 1238)  heparin bolus via infusion 4,000 Units (4,000 Units Intravenous Bolus from Bag 09/14/22 1402)    Mobility walks     Focused Assessments Cardiac Assessment Handoff:    Lab  Results  Component Value Date   CKTOTAL 56 11/27/2007   CKMB 0.6 11/27/2007   TROPONINI 0.01        NO INDICATION OF MYOCARDIAL INJURY. 11/27/2007   Lab Results  Component Value Date   DDIMER 0.41 09/14/2022  Does the Patient currently have chest pain? No    R Recommendations: See Admitting Provider Note  Report given to:   Additional Notes:

## 2022-09-14 NOTE — H&P (Signed)
Cardiology Admission History and Physical   Patient ID: COLLENE MASSIMINO MRN: [id number]; DOB: Nov 01, 1965   Admission date: 09/14/2022  PCP:  Fleet Contras, MD   [facility] Providers Cardiologist:  None        Chief Complaint:  Chest pain  Patient Profile:   [name] is a 57 y.o. female with pmh sx for HTN HLD Seizures and Obesity who is being seen 09/14/2022 for the evaluation of NSTEMI.  History of Present Illness:   [name] is a 57 y.o. female with pmh sx for HTN HLD Seizures and Obesity who is being seen 09/14/2022 for the evaluation of NSTEMI. She has been having CP since last week on and off. It started while doing water aerobics. Today the pain got really worse 9/10 hence came to the ED. Pain radiated to the shoulder and she said pain was worse with movement and breathing. Had some SOB as well. She says it is in the middle of her chest and moves to her right shoulder and a little bit in her back. She was sent to [facility] ED from OSH ED for further evaluation. Her troponin rose upto 400s; EKG showed ischemic changes in inferior and lateral leads. VS overall stable. She was started on heparin drip and nitro drip after which her pain improved to 1/10. She smokes 5 cigs a day since 30 years. Cardiology was called for admission. She is allergic to aspirin- get swelling and hives.    Past Medical History:  Diagnosis Date   Arthritis    Carpal tunnel syndrome    chronoic muscle spasms  & back pain   Chronic back pain    Fibroids    uterine   GERD (gastroesophageal reflux disease)    Hypertension    Seizures    last seizure 4-5 years ago/on keppra   Single stillborn delivery outcome    Smoker     Past Surgical History:  Procedure Laterality Date   ABDOMINAL HYSTERECTOMY     BACK SURGERY  02/2017   CESAREAN SECTION     1 time   LUMBAR LAMINECTOMY/DECOMPRESSION MICRODISCECTOMY Left 02/03/2017   Procedure: LEFT SIDED LUMBAR 5-SACRUM 1 MICRODISECTOMY;  Surgeon:  [name], MD;  Location: [facility] OR;  Service: Orthopedics;  Laterality: Left;  LEFT SIDED LUMBAR 5-SACRUM 1 MICRODISECTOMY;    MYOMECTOMY     TUBAL LIGATION       Medications Prior to Admission: Prior to Admission medications   Medication Sig Start Date End Date Taking? Authorizing Provider  BIOTIN 5000 PO Take 1 tablet by mouth daily.    [provider]  carisoprodol (SOMA) 350 MG tablet Take 350 mg by mouth daily as needed for muscle spasms. 04/24/19   [provider]  diclofenac Sodium (VOLTAREN) 1 % GEL Apply 1 Application topically as needed for moderate pain. 11/09/21   [provider]  ergocalciferol (VITAMIN D2) 1.25 MG (50000 UT) capsule Take 50,000 Units by mouth once a week.    [provider]  folic acid (FOLVITE) 400 MCG tablet Take 400 mcg by mouth daily.    [provider]  HYDROcodone-acetaminophen (NORCO) 10-325 MG tablet Take 1 tablet by mouth up to 4 (four) times daily as needed for severe pain only. 05/05/22     levETIRAcetam (KEPPRA) 500 MG tablet Take 1 tablet (500 mg total) by mouth 2 (two) times daily. 07/27/12   Richarda Overlie, MD  lubiprostone (AMITIZA) 24 MCG capsule Take 24 mcg by mouth  daily as needed for constipation. 12/30/21   [provider]  pantoprazole (PROTONIX) 40 MG tablet Take 40 mg by mouth daily before breakfast.     [provider]  promethazine (PHENERGAN) 25 MG tablet Take 1 tablet (25 mg total) by mouth every 6 (six) hours as needed. 03/26/22   Danford, Earl Lites, MD  promethazine (PHENERGAN) 6.25 MG/5ML syrup Take 20 mLs (25 mg total) by mouth every 8 (eight) hours as needed for nausea or vomiting. 03/26/22 03/26/23  DanfordEarl Lites, MD  simvastatin (ZOCOR) 10 MG tablet Take 10 mg by mouth at bedtime. 10/24/15   [provider]  solifenacin (VESICARE) 10 MG tablet Take 10 mg by mouth daily. 12/09/21   [provider]     Allergies:    Allergies  Allergen Reactions    Aspirin Anaphylaxis, Hives and Swelling    Swelling of lips and mouth    Penicillins Anaphylaxis, Hives and Swelling    Has patient had a PCN reaction causing immediate rash, facial/tongue/throat swelling, SOB or lightheadedness with hypotension: Yes Has patient had a PCN reaction causing severe rash involving mucus membranes or skin necrosis: Yes Has patient had a PCN reaction that required hospitalization No Has patient had a PCN reaction occurring within the last 10 years: No If all of the above answers are "NO", then may proceed with Cephalosporin use.     Social History:   Social History   Socioeconomic History   Marital status: Single    Spouse name: Not on file   Number of children: Not on file   Years of education: Not on file   Highest education level: Not on file  Occupational History   Not on file  Tobacco Use   Smoking status: Every Day    Packs/day: 0.50    Years: 30.00    Additional pack years: 0.00    Total pack years: 15.00    Types: Cigarettes   Smokeless tobacco: Never  Vaping Use   Vaping Use: Never used  Substance and Sexual Activity   Alcohol use: No   Drug use: No   Sexual activity: Not on file  Other Topics Concern   Not on file  Social History Narrative   Lives with son.  She has another son.  One grandchild.     Social Determinants of Health   Financial Resource Strain: Not on file  Food Insecurity: No Food Insecurity (03/24/2022)   Hunger Vital Sign    Worried About Running Out of Food in the Last Year: Never true    Ran Out of Food in the Last Year: Never true  Transportation Needs: No Transportation Needs (03/24/2022)   PRAPARE - Administrator, Civil Service (Medical): No    Lack of Transportation (Non-Medical): No  Physical Activity: Not on file  Stress: Not on file  Social Connections: Not on file  Intimate Partner Violence: Not At Risk (03/24/2022)   Humiliation, Afraid, Rape, and Kick questionnaire    Fear of  Current or Ex-Partner: No    Emotionally Abused: No    Physically Abused: No    Sexually Abused: No    Family History:   The patient's family history includes Breast cancer in her sister; CAD in an other family member; Diabetes in her father; Diabetes Mellitus II in her father; Hypertension in her brother, father, mother, sister, sister, and sister.    ROS:  Please see the history of present illness.  All other ROS reviewed  and negative.     Physical Exam/Data:   Vitals:   09/14/22 1800 09/14/22 1815 09/14/22 1830 09/14/22 1856  BP: (!) 126/97 121/86 119/88   Pulse:      Resp: Temp:    98.9 F (37.2 C)  TempSrc:    Oral  SpO2:      Weight:      Height:       No intake or output data in the 24 hours ending 09/14/22 2118    09/14/2022   10:32 AM 03/24/2022    8:13 PM 03/23/2022    4:30 PM  Last 3 Weights  Weight (lbs) 203 lb 7.8 oz 203 lb 8 oz 204 lb  Weight (kg) 92.3 kg 92.307 kg 92.534 kg     Body mass index is 34.93 kg/m.  General:  Well nourished, well developed, in no acute distress HEENT: normal Neck: no JVD Vascular: No carotid bruits; Distal pulses 2+ bilaterally   Cardiac:  normal S1, S2; RRR; no murmur  Lungs:  clear to auscultation bilaterally, no wheezing, rhonchi or rales  Abd: soft, nontender, no hepatomegaly  Ext: no edema Musculoskeletal:  No deformities, BUE and BLE strength normal and equal Skin: warm and dry  Neuro:  CNs 2-12 intact, no focal abnormalities noted Psych:  Normal affect    EKG:  The ECG that was done  was personally reviewed and demonstrates ST changes in inferior and lateral leads. No STEMI   Laboratory Data:  High Sensitivity Troponin:   Recent Labs  Lab 09/14/22 1118 09/14/22 1254  TROPONINIHS 299* 488*      Chemistry Recent Labs  Lab 09/14/22 1118  NA 138  K 3.5  CL 106  CO2 24  GLUCOSE 108*  BUN 12  CREATININE 0.86  CALCIUM 8.7*  GFRNONAA >60  ANIONGAP 8    No results for input(s): "PROT",  "ALBUMIN", "AST", "ALT", "ALKPHOS", "BILITOT" in the last 168 hours. Lipids No results for input(s): "CHOL", "TRIG", "HDL", "LABVLDL", "LDLCALC", "CHOLHDL" in the last 168 hours. Hematology Recent Labs  Lab 09/14/22 1118  WBC 7.3  RBC 4.34  HGB 12.2  HCT 37.1  MCV 85.5  MCH 28.1  MCHC 32.9  RDW 14.3  PLT 380   Thyroid No results for input(s): "TSH", "FREET4" in the last 168 hours. BNPNo results for input(s): "BNP", "PROBNP" in the last 168 hours.  DDimer  Recent Labs  Lab 09/14/22 1225  DDIMER 0.41     Radiology/Studies:  CT Angio Chest PE W and/or Wo Contrast  Result Date: 09/14/2022 CLINICAL DATA:  Right chest pain, right shoulder pain EXAM: CT ANGIOGRAPHY CHEST WITH CONTRAST TECHNIQUE: Multidetector CT imaging of the chest was performed using the standard protocol during bolus administration of intravenous contrast. Multiplanar CT image reconstructions and MIPs were obtained to evaluate the vascular anatomy. RADIATION DOSE REDUCTION: This exam was performed according to the departmental dose-optimization program which includes automated exposure control, adjustment of the mA and/or kV according to patient size and/or use of iterative reconstruction technique. CONTRAST:  OMNIPAQUE IOHEXOL 350 MG/ML SOLN COMPARISON:  Chest radiographs done earlier today FINDINGS: Cardiovascular: There is homogeneous enhancement in thoracic aorta. There are no intraluminal filling defects in pulmonary artery branches. Scattered coronary artery calcifications are seen. Mediastinum/Nodes: No significant lymphadenopathy is seen. Lungs/Pleura: There is no focal pulmonary consolidation. Small linear densities in the lower lung fields may suggest minimal scarring or subsegmental atelectasis. There is no pleural effusion or pneumothorax. Upper Abdomen: Small  hiatal hernia is seen. Musculoskeletal: No acute findings are seen. Review of the MIP images confirms the above findings. IMPRESSION: There is no  evidence of pulmonary artery embolism. There is no evidence of thoracic aortic dissection. There is no focal pulmonary consolidation. Small hiatal hernia. There are few scattered coronary artery calcifications. Electronically Signed   By: Ernie Avena M.D.   On: 09/14/2022 12:57   DG Chest Portable 1 View  Result Date: 09/14/2022 CLINICAL DATA:  Pain. EXAM: PORTABLE CHEST 1 VIEW COMPARISON:  02/15/2019. FINDINGS: Clear lungs. Normal heart size and mediastinal contours. No pleural effusion or pneumothorax. Visualized bones and upper abdomen are unremarkable. IMPRESSION: No evidence of acute cardiopulmonary disease. Electronically Signed   By: Orvan Falconer M.D.   On: 09/14/2022 11:16     Assessment and Plan:   # NSTEMI # HTN # HLD # Smoking # Morbid Obesity  -Patient presenting with chest pain and elevated troponin and EKG changes -IV Heparin load and drip -Allergic to aspirin so load with plavix and daily from tomorrow AM -Echo in AM -LHC in AM -Atorvastatin 80 mg -Metoprolol 12.5 mg BID -Telemetry -Lipid panel -K >4 (replete) -Encourage weight loss and quit smoking  For questions or updates, please contact [facility] Please consult [email or web address] for contact info under     Signed, Hermelinda Dellen, MD  09/14/2022 9:18 PM

## 2022-09-14 NOTE — ED Triage Notes (Signed)
Pt states felt pull/tightness in right side of chest 6 days ago while doing water arobics, state pain now radiating into right shoulder, pain worse with movement of right arm and shoulder  Pain worse with deep breathing

## 2022-09-14 NOTE — ED Notes (Signed)
Pt requested food given mac and cheese and drink family remins in room with pt

## 2022-09-15 ENCOUNTER — Inpatient Hospital Stay (HOSPITAL_COMMUNITY)
Admission: EM | Disposition: A | Discharge status: Home / Self Care | Payer: Self-pay | Source: Home / Self Care | Attending: Cardiology

## 2022-09-15 ENCOUNTER — Other Ambulatory Visit (HOSPITAL_COMMUNITY): Payer: 59

## 2022-09-15 DIAGNOSIS — I2510 Atherosclerotic heart disease of native coronary artery without angina pectoris: Secondary | ICD-10-CM

## 2022-09-15 HISTORY — PX: LEFT HEART CATH AND CORONARY ANGIOGRAPHY: CATH118249

## 2022-09-15 HISTORY — PX: CORONARY STENT INTERVENTION: CATH118234

## 2022-09-15 LAB — CBC
HCT: 35.8 % — ABNORMAL LOW (ref 36.0–46.0)
Hemoglobin: 11.9 g/dL — ABNORMAL LOW (ref 12.0–15.0)
MCH: 28.2 pg (ref 26.0–34.0)
MCHC: 33.2 g/dL (ref 30.0–36.0)
MCV: 84.8 fL (ref 80.0–100.0)
Platelets: 368 10*3/uL (ref 150–400)
RBC: 4.22 MIL/uL (ref 3.87–5.11)
RDW: 14.5 % (ref 11.5–15.5)
WBC: 6.6 10*3/uL (ref 4.0–10.5)
nRBC: 0 % (ref 0.0–0.2)

## 2022-09-15 LAB — PROTIME-INR
INR: 1.2 (ref 0.8–1.2)
Prothrombin Time: 15.1 seconds (ref 11.4–15.2)

## 2022-09-15 LAB — BASIC METABOLIC PANEL
Anion gap: 7 (ref 5–15)
BUN: 10 mg/dL (ref 6–20)
CO2: 24 mmol/L (ref 22–32)
Calcium: 8.8 mg/dL — ABNORMAL LOW (ref 8.9–10.3)
Chloride: 108 mmol/L (ref 98–111)
Creatinine, Ser: 0.87 mg/dL (ref 0.44–1.00)
GFR, Estimated: >60 mL/min (ref >60)
Glucose, Bld: 96 mg/dL (ref 70–99)
Potassium: 3.6 mmol/L (ref 3.5–5.1)
Sodium: 139 mmol/L (ref 135–145)

## 2022-09-15 LAB — HEPARIN LEVEL (UNFRACTIONATED): Heparin Unfractionated: 0.27 IU/mL — ABNORMAL LOW (ref 0.30–0.70)

## 2022-09-15 LAB — TROPONIN I (HIGH SENSITIVITY): Troponin I (High Sensitivity): 1011 ng/L (ref <18)

## 2022-09-15 LAB — POCT ACTIVATED CLOTTING TIME: Activated Clotting Time: 325 seconds

## 2022-09-15 SURGERY — LEFT HEART CATH AND CORONARY ANGIOGRAPHY
Anesthesia: LOCAL

## 2022-09-15 MED ORDER — SODIUM CHLORIDE 0.9 % IV SOLN: 250.0000 mL | INTRAVENOUS | Status: DC | PRN

## 2022-09-15 MED ORDER — NITROGLYCERIN 1 MG/10 ML FOR IR/CATH LAB
INTRA_ARTERIAL | Status: DC | PRN
  Administered 2022-09-15 (×2): 200 ug via INTRACORONARY

## 2022-09-15 MED ORDER — HEPARIN (PORCINE) IN NACL 1000-0.9 UT/500ML-% IV SOLN
INTRAVENOUS | Status: DC | PRN
  Administered 2022-09-15 (×2): 500 mL

## 2022-09-15 MED ORDER — VERAPAMIL HCL 2.5 MG/ML IV SOLN
INTRAVENOUS | Status: AC
  Filled 2022-09-15: qty 2

## 2022-09-15 MED ORDER — SODIUM CHLORIDE 0.9% FLUSH
3.0000 mL | Freq: Two times a day (BID) | INTRAVENOUS | Status: DC
  Administered 2022-09-15: 3 mL via INTRAVENOUS

## 2022-09-15 MED ORDER — FENTANYL CITRATE (PF) 100 MCG/2ML IJ SOLN
INTRAMUSCULAR | Status: AC
  Filled 2022-09-15: qty 2

## 2022-09-15 MED ORDER — SODIUM CHLORIDE 0.9% FLUSH
3.0000 mL | Freq: Two times a day (BID) | INTRAVENOUS | Status: DC
  Administered 2022-09-16: 3 mL via INTRAVENOUS

## 2022-09-15 MED ORDER — VERAPAMIL HCL 2.5 MG/ML IV SOLN
INTRAVENOUS | Status: DC | PRN
  Administered 2022-09-15: 10 mL via INTRA_ARTERIAL

## 2022-09-15 MED ORDER — MIDAZOLAM HCL 2 MG/2ML IJ SOLN
INTRAMUSCULAR | Status: AC
  Filled 2022-09-15: qty 2

## 2022-09-15 MED ORDER — LIDOCAINE HCL (PF) 1 % IJ SOLN
INTRAMUSCULAR | Status: AC
  Filled 2022-09-15: qty 30

## 2022-09-15 MED ORDER — SODIUM CHLORIDE 0.9 % WEIGHT BASED INFUSION: 3.0000 mL/kg/h | INTRAVENOUS | Status: DC

## 2022-09-15 MED ORDER — NITROGLYCERIN 1 MG/10 ML FOR IR/CATH LAB
INTRA_ARTERIAL | Status: AC
  Filled 2022-09-15: qty 10

## 2022-09-15 MED ORDER — HEPARIN SODIUM (PORCINE) 1000 UNIT/ML IJ SOLN
INTRAMUSCULAR | Status: AC
  Filled 2022-09-15: qty 10

## 2022-09-15 MED ORDER — SODIUM CHLORIDE 0.9 % IV SOLN: INTRAVENOUS | Status: AC

## 2022-09-15 MED ORDER — AMLODIPINE BESYLATE 5 MG PO TABS
5.0000 mg | ORAL_TABLET | Freq: Every day | ORAL | Status: DC
  Administered 2022-09-15 – 2022-09-16 (×2): 5 mg via ORAL
  Filled 2022-09-15 (×2): qty 1

## 2022-09-15 MED ORDER — LIDOCAINE HCL (PF) 1 % IJ SOLN
INTRAMUSCULAR | Status: DC | PRN
  Administered 2022-09-15: 5 mL via INTRADERMAL

## 2022-09-15 MED ORDER — MIDAZOLAM HCL 2 MG/2ML IJ SOLN
INTRAMUSCULAR | Status: DC | PRN
  Administered 2022-09-15: 1 mg via INTRAVENOUS
  Administered 2022-09-15: 2 mg via INTRAVENOUS

## 2022-09-15 MED ORDER — LABETALOL HCL 5 MG/ML IV SOLN: 10.0000 mg | INTRAVENOUS | Status: AC | PRN

## 2022-09-15 MED ORDER — IOHEXOL 350 MG/ML SOLN
INTRAVENOUS | Status: DC | PRN
  Administered 2022-09-15: 90 mL

## 2022-09-15 MED ORDER — SODIUM CHLORIDE 0.9% FLUSH: 3.0000 mL | INTRAVENOUS | Status: DC | PRN

## 2022-09-15 MED ORDER — HYDRALAZINE HCL 20 MG/ML IJ SOLN: 10.0000 mg | INTRAMUSCULAR | Status: AC | PRN

## 2022-09-15 MED ORDER — SODIUM CHLORIDE 0.9 % WEIGHT BASED INFUSION: 1.0000 mL/kg/h | INTRAVENOUS | Status: DC

## 2022-09-15 MED ORDER — SODIUM CHLORIDE 0.9 % IV SOLN
250.0000 mL | INTRAVENOUS | Status: DC | PRN
  Administered 2022-09-15: 250 mL via INTRAVENOUS

## 2022-09-15 MED ORDER — HYDROCODONE-ACETAMINOPHEN 10-325 MG PO TABS
1.0000 | ORAL_TABLET | Freq: Four times a day (QID) | ORAL | Status: DC | PRN
  Administered 2022-09-15 – 2022-09-16 (×4): 1 via ORAL
  Filled 2022-09-15 (×4): qty 1

## 2022-09-15 MED ORDER — FENTANYL CITRATE (PF) 100 MCG/2ML IJ SOLN
INTRAMUSCULAR | Status: DC | PRN
  Administered 2022-09-15 (×2): 25 ug via INTRAVENOUS

## 2022-09-15 MED ORDER — HEPARIN SODIUM (PORCINE) 1000 UNIT/ML IJ SOLN
INTRAMUSCULAR | Status: DC | PRN
  Administered 2022-09-15: 4000 [IU] via INTRAVENOUS
  Administered 2022-09-15: 5000 [IU] via INTRAVENOUS

## 2022-09-15 SURGICAL SUPPLY — 21 items
BALLN EMERGE MR 2.5X12 (BALLOONS) ×1
BALLN ~~LOC~~ EMERGE MR 3.75X15 (BALLOONS) ×1
BALLOON EMERGE MR 2.5X12 (BALLOONS) IMPLANT
BALLOON ~~LOC~~ EMERGE MR 3.75X15 (BALLOONS) IMPLANT
CATH 5FR JL3.5 JR4 ANG PIG MP (CATHETERS) IMPLANT
CATH LAUNCHER 6FR EBU3.5 (CATHETERS) IMPLANT
DEVICE RAD COMP TR BAND LRG (VASCULAR PRODUCTS) IMPLANT
GLIDESHEATH SLEND SS 6F .021 (SHEATH) IMPLANT
GUIDEWIRE INQWIRE 1.5J.035X260 (WIRE) IMPLANT
INQWIRE 1.5J .035X260CM (WIRE) ×1
KIT ENCORE 26 ADVANTAGE (KITS) IMPLANT
KIT HEART LEFT (KITS) ×1 IMPLANT
KIT HEMO VALVE WATCHDOG (MISCELLANEOUS) IMPLANT
PACK CARDIAC CATHETERIZATION (CUSTOM PROCEDURE TRAY) ×1 IMPLANT
SHEATH GLIDE SLENDER 4/5FR (SHEATH) IMPLANT
STENT SYNERGY XD 3.50X20 (Permanent Stent) IMPLANT
SYNERGY XD 3.50X20 (Permanent Stent) ×1 IMPLANT
SYR MEDRAD MARK 7 150ML (SYRINGE) ×1 IMPLANT
TRANSDUCER W/STOPCOCK (MISCELLANEOUS) ×1 IMPLANT
TUBING CIL FLEX 10 FLL-RA (TUBING) ×1 IMPLANT
WIRE RUNTHROUGH .014X180CM (WIRE) IMPLANT

## 2022-09-15 NOTE — Progress Notes (Addendum)
Rounding Note    Patient Name: [name] Date of Encounter: 09/15/2022  [facility] Cardiologist: None   Subjective   No chest pain, chronic back pain.  Inpatient Medications    Scheduled Meds:  atorvastatin  80 mg Oral Daily   clopidogrel  75 mg Oral Q breakfast   folic acid  500 mcg Oral Daily   levETIRAcetam  500 mg Oral BID   metoprolol tartrate  12.5 mg Oral BID   pantoprazole  40 mg Oral QAC breakfast   sodium chloride flush  3 mL Intravenous Q12H   Continuous Infusions:  sodium chloride     sodium chloride     Followed by   sodium chloride     heparin 900 Units/hr (09/14/22 2145)   nitroGLYCERIN 20 mcg/min (09/14/22 2145)   PRN Meds: sodium chloride, acetaminophen, carisoprodol, lubiprostone, nitroGLYCERIN, ondansetron (ZOFRAN) IV, sodium chloride flush   Vital Signs    Vitals:   09/14/22 2249 09/15/22 0355 09/15/22 0725 09/15/22 0746  BP: 125/84 (!) 131/92 (!) 144/77 138/80  Pulse: 78 86 83 76  Resp: 16 20 17    Temp: 98.8 F (37.1 C) 98.6 F (37 C) 99.3 F (37.4 C)   TempSrc: Oral Oral Oral   SpO2: 97% 98% 98% 98%  Weight: 93.5 kg 92.3 kg    Height:       No intake or output data in the 24 hours ending 09/15/22 0813    09/15/2022    3:55 AM 09/14/2022   10:49 PM 09/14/2022   10:32 AM  Last 3 Weights  Weight (lbs) 203 lb 6.4 oz 206 lb 2.1 oz 203 lb 7.8 oz  Weight (kg) 92.262 kg 93.5 kg 92.3 kg      Telemetry    Sinus Rhythm - Personally Reviewed  ECG    Sinus Rhythm, 79 bpm with TWI anterolateral leads - Personally Reviewed  Physical Exam   GEN: No acute distress.   Neck: No JVD Cardiac: RRR, no murmurs, rubs, or gallops.  Respiratory: Clear to auscultation bilaterally. GI: Soft, nontender, non-distended  MS: No edema; No deformity. Neuro:  Nonfocal  Psych: Normal affect   Labs    High Sensitivity Troponin:   Recent Labs  Lab 09/14/22 1118 09/14/22 1254 09/15/22 0320  TROPONINIHS 299* 488* 1,011*      Chemistry Recent Labs  Lab 09/14/22 1118 09/15/22 0320  NA 138 139  K 3.5 3.6  CL 106 108  CO2 24 24  GLUCOSE 108* 96  BUN 12 10  CREATININE 0.86 0.87  CALCIUM 8.7* 8.8*  GFRNONAA >60 >60  ANIONGAP 8 7    Lipids No results for input(s): "CHOL", "TRIG", "HDL", "LABVLDL", "LDLCALC", "CHOLHDL" in the last 168 hours.  Hematology Recent Labs  Lab 09/14/22 1118 09/15/22 0320  WBC 7.3 6.6  RBC 4.34 4.22  HGB 12.2 11.9*  HCT 37.1 35.8*  MCV 85.5 84.8  MCH 28.1 28.2  MCHC 32.9 33.2  RDW 14.3 14.5  PLT 380 368   Thyroid No results for input(s): "TSH", "FREET4" in the last 168 hours.  BNPNo results for input(s): "BNP", "PROBNP" in the last 168 hours.  DDimer  Recent Labs  Lab 09/14/22 1225  DDIMER 0.41     Radiology    CT Angio Chest PE W and/or Wo Contrast  Result Date: 09/14/2022 CLINICAL DATA:  Right chest pain, right shoulder pain EXAM: CT ANGIOGRAPHY CHEST WITH CONTRAST TECHNIQUE: Multidetector CT imaging of the chest was performed using the standard  protocol during bolus administration of intravenous contrast. Multiplanar CT image reconstructions and MIPs were obtained to evaluate the vascular anatomy. RADIATION DOSE REDUCTION: This exam was performed according to the departmental dose-optimization program which includes automated exposure control, adjustment of the mA and/or kV according to patient size and/or use of iterative reconstruction technique. CONTRAST:  OMNIPAQUE IOHEXOL 350 MG/ML SOLN COMPARISON:  Chest radiographs done earlier today FINDINGS: Cardiovascular: There is homogeneous enhancement in thoracic aorta. There are no intraluminal filling defects in pulmonary artery branches. Scattered coronary artery calcifications are seen. Mediastinum/Nodes: No significant lymphadenopathy is seen. Lungs/Pleura: There is no focal pulmonary consolidation. Small linear densities in the lower lung fields may suggest minimal scarring or subsegmental atelectasis. There is  no pleural effusion or pneumothorax. Upper Abdomen: Small hiatal hernia is seen. Musculoskeletal: No acute findings are seen. Review of the MIP images confirms the above findings. IMPRESSION: There is no evidence of pulmonary artery embolism. There is no evidence of thoracic aortic dissection. There is no focal pulmonary consolidation. Small hiatal hernia. There are few scattered coronary artery calcifications. Electronically Signed   By: Ernie Avena M.D.   On: 09/14/2022 12:57   DG Chest Portable 1 View  Result Date: 09/14/2022 CLINICAL DATA:  Pain. EXAM: PORTABLE CHEST 1 VIEW COMPARISON:  02/15/2019. FINDINGS: Clear lungs. Normal heart size and mediastinal contours. No pleural effusion or pneumothorax. Visualized bones and upper abdomen are unremarkable. IMPRESSION: No evidence of acute cardiopulmonary disease. Electronically Signed   By: Orvan Falconer M.D.   On: 09/14/2022 11:16    Cardiac Studies   N/a   Patient Profile     57 y.o. female with PMH of HTN, HLD, seizures and obesity who presented with chest pain and found to have a NSTEMI  Assessment & Plan    NSTEMI -- Presented with 1 week of intermittent chest pain.  High-sensitivity troponin 299>> 488>> 1011.  EKG showed sinus rhythm, T wave inversions in anterior lateral leads with slight J-point elevation in lead III and aVR.  -- Plan for cardiac catheterization today -- Continue IV heparin, has ASA allergy therefore was started on plavix, statin, metoprolol -- echo pending  Shared Decision Making/Informed Consent The risks [stroke (1 in 1000), death (1 in 1000), kidney failure [usually temporary] (1 in 500), bleeding (1 in 200), allergic reaction [possibly serious] (1 in 200)], benefits (diagnostic support and management of coronary artery disease) and alternatives of a cardiac catheterization were discussed in detail with [name] and she is willing to proceed.  HTN -- controlled -- continue metoprolol 12.5mg   BID  HLD -- LDL 104, HDL 39 -- continue atorvastatin  daily   Tobacco use -- cessation encouraged   Chronic back pain -- continue home norco  For questions or updates, please contact [facility] Please consult [email or web address] for contact info under        Signed, Laverda Page, NP  09/15/2022, 8:13 AM    I have personally seen and examined this patient. I agree with the assessment and plan as outlined above.  Pt admitted with chest pain. Elevated troponin c/w NSTEMI. Planning for cardiac cath today with probable PCI. All questions answered this am.  I have reviewed the risks, indications, and alternatives to cardiac catheterization, possible angioplasty, and stenting with the patient. Risks include but are not limited to bleeding, infection, vascular injury, stroke, myocardial infection, arrhythmia, kidney injury, radiation-related injury in the case of prolonged fluoroscopy use, emergency cardiac surgery, and death. The patient understands the risks  of serious complication is 1-2 in 1000 with diagnostic cardiac cath and 1-2% or less with angioplasty/stenting. Continue IV heparin.    Verne Carrow, MD, [facility] 09/15/2022 11:05 AM

## 2022-09-15 NOTE — Interval H&P Note (Signed)
Cath Lab Visit (complete for each Cath Lab visit)  Clinical Evaluation Leading to the Procedure:   ACS: Yes.    Non-ACS:    Anginal Classification: CCS IV  Anti-ischemic medical therapy: Minimal Therapy (1 class of medications)  Non-Invasive Test Results: No non-invasive testing performed  Prior CABG: No previous CABG      History and Physical Interval Note:  09/15/2022 12:02 PM  [name]  has presented today for surgery, with the diagnosis of nstemi.  The various methods of treatment have been discussed with the patient and family. After consideration of risks, benefits and other options for treatment, the patient has consented to  Procedure(s): LEFT HEART CATH AND CORONARY ANGIOGRAPHY (N/A) as a surgical intervention.  The patient's history has been reviewed, patient examined, no change in status, stable for surgery.  I have reviewed the patient's chart and labs.  Questions were answered to the patient's satisfaction.     [name]

## 2022-09-15 NOTE — Plan of Care (Addendum)
Pt admitted to unit for evaluation due to chest pain. Awake and oriented x4. Currently denies any pain or SOB. Nitro and Hep gtt infusing. Pt aware of  planned procedure. Provided education. Verbalized understanding.  Problem: Education: Goal: Knowledge of General Education information will improve Description: Including pain rating scale, medication(s)/side effects and non-pharmacologic comfort measures Outcome: Progressing   Problem: Health Behavior/Discharge Planning: Goal: Ability to manage health-related needs will improve Outcome: Progressing   Problem: Clinical Measurements: Goal: Ability to maintain clinical measurements within normal limits will improve Outcome: Progressing Goal: Will remain free from infection Outcome: Progressing Goal: Diagnostic test results will improve Outcome: Progressing Goal: Respiratory complications will improve Outcome: Progressing Goal: Cardiovascular complication will be avoided Outcome: Progressing   Problem: Activity: Goal: Risk for activity intolerance will decrease Outcome: Progressing   Problem: Nutrition: Goal: Adequate nutrition will be maintained Outcome: Progressing   Problem: Coping: Goal: Level of anxiety will decrease Outcome: Progressing   Problem: Elimination: Goal: Will not experience complications related to bowel motility Outcome: Progressing Goal: Will not experience complications related to urinary retention Outcome: Progressing   Problem: Pain Managment: Goal: General experience of comfort will improve Outcome: Progressing   Problem: Safety: Goal: Ability to remain free from injury will improve Outcome: Progressing   Problem: Skin Integrity: Goal: Risk for impaired skin integrity will decrease Outcome: Progressing   Problem: Education: Goal: Understanding of cardiac disease, CV risk reduction, and recovery process will improve Outcome: Progressing Goal: Individualized Educational Video(s) Outcome:  Progressing   Problem: Activity: Goal: Ability to tolerate increased activity will improve Outcome: Progressing   Problem: Cardiac: Goal: Ability to achieve and maintain adequate cardiovascular perfusion will improve Outcome: Progressing   Problem: Health Behavior/Discharge Planning: Goal: Ability to safely manage health-related needs after discharge will improve Outcome: Progressing   Problem: Education: Goal: Understanding of CV disease, CV risk reduction, and recovery process will improve Outcome: Progressing Goal: Individualized Educational Video(s) Outcome: Progressing   Problem: Activity: Goal: Ability to return to baseline activity level will improve Outcome: Progressing   Problem: Cardiovascular: Goal: Ability to achieve and maintain adequate cardiovascular perfusion will improve Outcome: Progressing Goal: Vascular access site(s) Level 0-1 will be maintained Outcome: Progressing   Problem: Health Behavior/Discharge Planning: Goal: Ability to safely manage health-related needs after discharge will improve Outcome: Progressing

## 2022-09-15 NOTE — H&P (View-Only) (Signed)
 Rounding Note    Patient Name: [name] Date of Encounter: 09/15/2022  [facility] Cardiologist: None   Subjective   No chest pain, chronic back pain.  Inpatient Medications    Scheduled Meds:  atorvastatin  80 mg Oral Daily   clopidogrel  75 mg Oral Q breakfast   folic acid  500 mcg Oral Daily   levETIRAcetam  500 mg Oral BID   metoprolol tartrate  12.5 mg Oral BID   pantoprazole  40 mg Oral QAC breakfast   sodium chloride flush  3 mL Intravenous Q12H   Continuous Infusions:  sodium chloride     sodium chloride     Followed by   sodium chloride     heparin 900 Units/hr (09/14/22 2145)   nitroGLYCERIN 20 mcg/min (09/14/22 2145)   PRN Meds: sodium chloride, acetaminophen, carisoprodol, lubiprostone, nitroGLYCERIN, ondansetron (ZOFRAN) IV, sodium chloride flush   Vital Signs    Vitals:   09/14/22 2249 09/15/22 0355 09/15/22 0725 09/15/22 0746  BP: 125/84 (!) 131/92 (!) 144/77 138/80  Pulse: 78 86 83 76  Resp: 16 20 17   Temp: 98.8 F (37.1 C) 98.6 F (37 C) 99.3 F (37.4 C)   TempSrc: Oral Oral Oral   SpO2: 97% 98% 98% 98%  Weight: 93.5 kg 92.3 kg    Height:       No intake or output data in the 24 hours ending 09/15/22 0813    09/15/2022    3:55 AM 09/14/2022   10:49 PM 09/14/2022   10:32 AM  Last 3 Weights  Weight (lbs) 203 lb 6.4 oz 206 lb 2.1 oz 203 lb 7.8 oz  Weight (kg) 92.262 kg 93.5 kg 92.3 kg      Telemetry    Sinus Rhythm - Personally Reviewed  ECG    Sinus Rhythm, 79 bpm with TWI anterolateral leads - Personally Reviewed  Physical Exam   GEN: No acute distress.   Neck: No JVD Cardiac: RRR, no murmurs, rubs, or gallops.  Respiratory: Clear to auscultation bilaterally. GI: Soft, nontender, non-distended  MS: No edema; No deformity. Neuro:  Nonfocal  Psych: Normal affect   Labs    High Sensitivity Troponin:   Recent Labs  Lab 09/14/22 1118 09/14/22 1254 09/15/22 0320  TROPONINIHS 299* 488* 1,011*      Chemistry Recent Labs  Lab 09/14/22 1118 09/15/22 0320  NA 138 139  K 3.5 3.6  CL 106 108  CO2 24 24  GLUCOSE 108* 96  BUN 12 10  CREATININE 0.86 0.87  CALCIUM 8.7* 8.8*  GFRNONAA >60 >60  ANIONGAP 8 7    Lipids No results for input(s): "CHOL", "TRIG", "HDL", "LABVLDL", "LDLCALC", "CHOLHDL" in the last 168 hours.  Hematology Recent Labs  Lab 09/14/22 1118 09/15/22 0320  WBC 7.3 6.6  RBC 4.34 4.22  HGB 12.2 11.9*  HCT 37.1 35.8*  MCV 85.5 84.8  MCH 28.1 28.2  MCHC 32.9 33.2  RDW 14.3 14.5  PLT 380 368   Thyroid No results for input(s): "TSH", "FREET4" in the last 168 hours.  BNPNo results for input(s): "BNP", "PROBNP" in the last 168 hours.  DDimer  Recent Labs  Lab 09/14/22 1225  DDIMER 0.41     Radiology    CT Angio Chest PE W and/or Wo Contrast  Result Date: 09/14/2022 CLINICAL DATA:  Right chest pain, right shoulder pain EXAM: CT ANGIOGRAPHY CHEST WITH CONTRAST TECHNIQUE: Multidetector CT imaging of the chest was performed using the standard   protocol during bolus administration of intravenous contrast. Multiplanar CT image reconstructions and MIPs were obtained to evaluate the vascular anatomy. RADIATION DOSE REDUCTION: This exam was performed according to the departmental dose-optimization program which includes automated exposure control, adjustment of the mA and/or kV according to patient size and/or use of iterative reconstruction technique. CONTRAST:  100mL OMNIPAQUE IOHEXOL 350 MG/ML SOLN COMPARISON:  Chest radiographs done earlier today FINDINGS: Cardiovascular: There is homogeneous enhancement in thoracic aorta. There are no intraluminal filling defects in pulmonary artery branches. Scattered coronary artery calcifications are seen. Mediastinum/Nodes: No significant lymphadenopathy is seen. Lungs/Pleura: There is no focal pulmonary consolidation. Small linear densities in the lower lung fields may suggest minimal scarring or subsegmental atelectasis. There is  no pleural effusion or pneumothorax. Upper Abdomen: Small hiatal hernia is seen. Musculoskeletal: No acute findings are seen. Review of the MIP images confirms the above findings. IMPRESSION: There is no evidence of pulmonary artery embolism. There is no evidence of thoracic aortic dissection. There is no focal pulmonary consolidation. Small hiatal hernia. There are few scattered coronary artery calcifications. Electronically Signed   By: Palani  Rathinasamy M.D.   On: 09/14/2022 12:57   DG Chest Portable 1 View  Result Date: 09/14/2022 CLINICAL DATA:  Pain. EXAM: PORTABLE CHEST 1 VIEW COMPARISON:  02/15/2019. FINDINGS: Clear lungs. Normal heart size and mediastinal contours. No pleural effusion or pneumothorax. Visualized bones and upper abdomen are unremarkable. IMPRESSION: No evidence of acute cardiopulmonary disease. Electronically Signed   By: Walter  Wiggins M.D.   On: 09/14/2022 11:16    Cardiac Studies   N/a   Patient Profile     57 y.o. female with PMH of HTN, HLD, seizures and obesity who presented with chest pain and found to have a NSTEMI  Assessment & Plan    NSTEMI -- Presented with 1 week of intermittent chest pain.  High-sensitivity troponin 299>> 488>> 1011.  EKG showed sinus rhythm, T wave inversions in anterior lateral leads with slight J-point elevation in lead III and aVR.  -- Plan for cardiac catheterization today -- Continue IV heparin, has ASA allergy therefore was started on plavix, statin, metoprolol -- echo pending  Shared Decision Making/Informed Consent The risks [stroke (1 in 1000), death (1 in 1000), kidney failure [usually temporary] (1 in 500), bleeding (1 in 200), allergic reaction [possibly serious] (1 in 200)], benefits (diagnostic support and management of coronary artery disease) and alternatives of a cardiac catheterization were discussed in detail with [name] and she is willing to proceed.  HTN -- controlled -- continue metoprolol 12.5mg  BID  HLD -- LDL 104, HDL 39 -- continue atorvastatin 80mg daily   Tobacco use -- cessation encouraged   Chronic back pain -- continue home norco  For questions or updates, please contact [facility] Please consult [email or web address] for contact info under        Signed, Lindsay Roberts, NP  09/15/2022, 8:13 AM    I have personally seen and examined this patient. I agree with the assessment and plan as outlined above.  Pt admitted with chest pain. Elevated troponin c/w NSTEMI. Planning for cardiac cath today with probable PCI. All questions answered this am.  I have reviewed the risks, indications, and alternatives to cardiac catheterization, possible angioplasty, and stenting with the patient. Risks include but are not limited to bleeding, infection, vascular injury, stroke, myocardial infection, arrhythmia, kidney injury, radiation-related injury in the case of prolonged fluoroscopy use, emergency cardiac surgery, and death. The patient understands the risks   of serious complication is 1-2 in 1000 with diagnostic cardiac cath and 1-2% or less with angioplasty/stenting. Continue IV heparin.    Tramel Westbrook, MD, FACC 09/15/2022 11:05 AM     

## 2022-09-15 NOTE — Plan of Care (Signed)
  Problem: Education: Goal: Knowledge of General Education information will improve Description: Including pain rating scale, medication(s)/side effects and non-pharmacologic comfort measures Outcome: Adequate for Discharge   Problem: Health Behavior/Discharge Planning: Goal: Ability to manage health-related needs will improve Outcome: Adequate for Discharge   Problem: Clinical Measurements: Goal: Ability to maintain clinical measurements within normal limits will improve Outcome: Adequate for Discharge Goal: Will remain free from infection Outcome: Adequate for Discharge Goal: Diagnostic test results will improve Outcome: Adequate for Discharge Goal: Respiratory complications will improve Outcome: Adequate for Discharge Goal: Cardiovascular complication will be avoided Outcome: Adequate for Discharge   Problem: Activity: Goal: Risk for activity intolerance will decrease Outcome: Adequate for Discharge   Problem: Nutrition: Goal: Adequate nutrition will be maintained Outcome: Adequate for Discharge   Problem: Coping: Goal: Level of anxiety will decrease Outcome: Adequate for Discharge   Problem: Elimination: Goal: Will not experience complications related to bowel motility Outcome: Adequate for Discharge Goal: Will not experience complications related to urinary retention Outcome: Adequate for Discharge   Problem: Pain Managment: Goal: General experience of comfort will improve Outcome: Adequate for Discharge   Problem: Safety: Goal: Ability to remain free from injury will improve Outcome: Adequate for Discharge   Problem: Skin Integrity: Goal: Risk for impaired skin integrity will decrease Outcome: Adequate for Discharge   Problem: Education: Goal: Understanding of cardiac disease, CV risk reduction, and recovery process will improve Outcome: Adequate for Discharge Goal: Individualized Educational Video(s) Outcome: Adequate for Discharge   Problem:  Activity: Goal: Ability to tolerate increased activity will improve Outcome: Adequate for Discharge   Problem: Cardiac: Goal: Ability to achieve and maintain adequate cardiovascular perfusion will improve Outcome: Adequate for Discharge   Problem: Health Behavior/Discharge Planning: Goal: Ability to safely manage health-related needs after discharge will improve Outcome: Adequate for Discharge   Problem: Education: Goal: Understanding of CV disease, CV risk reduction, and recovery process will improve Outcome: Adequate for Discharge Goal: Individualized Educational Video(s) Outcome: Adequate for Discharge   Problem: Activity: Goal: Ability to return to baseline activity level will improve Outcome: Adequate for Discharge   Problem: Cardiovascular: Goal: Ability to achieve and maintain adequate cardiovascular perfusion will improve Outcome: Adequate for Discharge Goal: Vascular access site(s) Level 0-1 will be maintained Outcome: Adequate for Discharge   Problem: Health Behavior/Discharge Planning: Goal: Ability to safely manage health-related needs after discharge will improve Outcome: Adequate for Discharge   Problem: Education: Goal: Understanding of CV disease, CV risk reduction, and recovery process will improve Outcome: Adequate for Discharge Goal: Individualized Educational Video(s) Outcome: Adequate for Discharge   Problem: Activity: Goal: Ability to return to baseline activity level will improve Outcome: Adequate for Discharge   Problem: Cardiovascular: Goal: Ability to achieve and maintain adequate cardiovascular perfusion will improve Outcome: Adequate for Discharge Goal: Vascular access site(s) Level 0-1 will be maintained Outcome: Adequate for Discharge   Problem: Health Behavior/Discharge Planning: Goal: Ability to safely manage health-related needs after discharge will improve Outcome: Adequate for Discharge   

## 2022-09-15 NOTE — Progress Notes (Signed)
ANTICOAGULATION CONSULT NOTE  Pharmacy Consult for heparin Indication: chest pain/ACS  Allergies  Allergen Reactions   Aspirin Anaphylaxis, Hives and Swelling    Swelling of lips and mouth    Penicillins Anaphylaxis, Hives and Swelling    Has patient had a PCN reaction causing immediate rash, facial/tongue/throat swelling, SOB or lightheadedness with hypotension: Yes Has patient had a PCN reaction causing severe rash involving mucus membranes or skin necrosis: Yes Has patient had a PCN reaction that required hospitalization No Has patient had a PCN reaction occurring within the last 10 years: No If all of the above answers are "NO", then may proceed with Cephalosporin use.     Patient Measurements: Height:  (162.6 cm) Weight: 92.3 kg (203 lb 6.4 oz) IBW/kg (Calculated) : 54.7 Heparin Dosing Weight: 75.6kg  Vital Signs: Temp: 99.3 F (37.4 C) (04/23 0725) Temp Source: Oral (04/23 0725) BP: 138/80 (04/23 0746) Pulse Rate: 76 (04/23 0746)  Labs: Recent Labs    09/14/22 1118 09/14/22 1254 09/14/22 2221 09/15/22 0320  HGB 12.2  --   --  11.9*  HCT 37.1  --   --  35.8*  PLT 380  --   --  368  LABPROT  --   --   --  15.1  INR  --   --   --  1.2  HEPARINUNFRC  --   --  0.49 0.27*  CREATININE 0.86  --   --  0.87  TROPONINIHS 299* 488*  --  1,011*     Estimated Creatinine Clearance: 77.6 mL/min (by C-G formula based on SCr of 0.87 mg/dL).   Medical History: Past Medical History:  Diagnosis Date   Arthritis    Carpal tunnel syndrome    chronoic muscle spasms  & back pain   Chronic back pain    Fibroids    uterine   GERD (gastroesophageal reflux disease)    Hypertension    Seizures    last seizure 4-5 years ago/on keppra   Single stillborn delivery outcome    Smoker     Medications:  Infusions:   sodium chloride     sodium chloride     Followed by   sodium chloride     heparin 900 Units/hr (09/14/22 2145)   nitroGLYCERIN 20 mcg/min (09/14/22 2145)     Assessment: 57 yof presented to the ED with CP. Troponin elevated and now starting IV heparin. Baseline CBC is WNL and she is not on anticoagulation PTA.   Heparin level slightly subtherapeutic at 0.27, CBC stable.  Goal of Therapy:  Heparin level 0.3-0.7 units/ml Monitor platelets by anticoagulation protocol: Yes   Plan:  Increase heparin to 1050 units/h Followup post/cath  Fredonia Highland, PharmD, BCPS, [facility] Clinical Pharmacist [phone] Please check AMION for all [facility] numbers 09/15/2022

## 2022-09-16 ENCOUNTER — Other Ambulatory Visit (HOSPITAL_COMMUNITY): Payer: Self-pay

## 2022-09-16 ENCOUNTER — Inpatient Hospital Stay (HOSPITAL_COMMUNITY): Payer: 59

## 2022-09-16 ENCOUNTER — Encounter (HOSPITAL_COMMUNITY): Payer: Self-pay | Admitting: Cardiovascular Disease

## 2022-09-16 DIAGNOSIS — R9431 Abnormal electrocardiogram [ECG] [EKG]: Secondary | ICD-10-CM

## 2022-09-16 LAB — ECHOCARDIOGRAM COMPLETE
Area-P 1/2: 3.34 cm2
Calc EF: 50.7 %
Height: 64 in
MV VTI: 2.32 cm2
S' Lateral: 2.2 cm
Single Plane A2C EF: 44.6 %
Single Plane A4C EF: 57.4 %
Weight: 3254.4 oz

## 2022-09-16 LAB — CBC
HCT: 33.2 % — ABNORMAL LOW (ref 36.0–46.0)
Hemoglobin: 11.1 g/dL — ABNORMAL LOW (ref 12.0–15.0)
MCH: 28.9 pg (ref 26.0–34.0)
MCHC: 33.4 g/dL (ref 30.0–36.0)
MCV: 86.5 fL (ref 80.0–100.0)
Platelets: 328 K/uL (ref 150–400)
RBC: 3.84 MIL/uL — ABNORMAL LOW (ref 3.87–5.11)
RDW: 14.6 % (ref 11.5–15.5)
WBC: 6.3 K/uL (ref 4.0–10.5)
nRBC: 0 % (ref 0.0–0.2)

## 2022-09-16 LAB — LIPID PANEL
Cholesterol: 131 mg/dL (ref 0–200)
HDL: 34 mg/dL — ABNORMAL LOW (ref >40)
LDL Cholesterol: 84 mg/dL (ref 0–99)
Total CHOL/HDL Ratio: 3.9 RATIO
Triglycerides: 67 mg/dL (ref <150)
VLDL: 13 mg/dL (ref 0–40)

## 2022-09-16 LAB — BASIC METABOLIC PANEL WITH GFR
Anion gap: 8 (ref 5–15)
BUN: 11 mg/dL (ref 6–20)
CO2: 21 mmol/L — ABNORMAL LOW (ref 22–32)
Calcium: 8.5 mg/dL — ABNORMAL LOW (ref 8.9–10.3)
Chloride: 108 mmol/L (ref 98–111)
Creatinine, Ser: 0.93 mg/dL (ref 0.44–1.00)
GFR, Estimated: >60 mL/min
Glucose, Bld: 131 mg/dL — ABNORMAL HIGH (ref 70–99)
Potassium: 3.6 mmol/L (ref 3.5–5.1)
Sodium: 137 mmol/L (ref 135–145)

## 2022-09-16 LAB — LIPOPROTEIN A (LPA): Lipoprotein (a): 200.4 nmol/L — ABNORMAL HIGH (ref <75.0)

## 2022-09-16 LAB — GLUCOSE, CAPILLARY: Glucose-Capillary: 98 mg/dL (ref 70–99)

## 2022-09-16 MED ORDER — METOPROLOL TARTRATE 25 MG PO TABS
25.0000 mg | ORAL_TABLET | Freq: Two times a day (BID) | ORAL | 1 refills | Status: DC
  Filled 2022-09-16: qty 180, 90d supply, fill #0

## 2022-09-16 MED ORDER — METOPROLOL TARTRATE 25 MG PO TABS
25.0000 mg | ORAL_TABLET | Freq: Two times a day (BID) | ORAL | Status: DC
  Administered 2022-09-16: 25 mg via ORAL
  Filled 2022-09-16: qty 1

## 2022-09-16 MED ORDER — PERFLUTREN LIPID MICROSPHERE
1.0000 mL | INTRAVENOUS | Status: AC | PRN
  Administered 2022-09-16: 2 mL via INTRAVENOUS

## 2022-09-16 MED ORDER — ATORVASTATIN CALCIUM 80 MG PO TABS
80.0000 mg | ORAL_TABLET | Freq: Every day | ORAL | 1 refills | Status: DC
  Filled 2022-09-16: qty 90, 90d supply, fill #0

## 2022-09-16 MED ORDER — NITROGLYCERIN 0.4 MG SL SUBL
0.4000 mg | SUBLINGUAL_TABLET | SUBLINGUAL | 2 refills | Status: AC | PRN
  Filled 2022-09-16: qty 25, 8d supply, fill #0

## 2022-09-16 MED ORDER — CLOPIDOGREL BISULFATE 75 MG PO TABS
75.0000 mg | ORAL_TABLET | Freq: Every day | ORAL | 2 refills | Status: DC
  Filled 2022-09-16: qty 90, 90d supply, fill #0

## 2022-09-16 MED FILL — Verapamil HCl IV Soln 2.5 MG/ML: INTRAVENOUS | Qty: 2 | Status: AC

## 2022-09-16 NOTE — Progress Notes (Signed)
  Echocardiogram 2D Echocardiogram has been performed.  [name] 09/16/2022, 10:13 AM

## 2022-09-16 NOTE — TOC Benefit Eligibility Note (Signed)
Patient Advocate Encounter  Insurance verification completed.    The patient is currently admitted and upon discharge could be taking Brilinta 90 mg.  The current 30 day co-pay is $0.00.   The patient is insured through [employer] Medicare Part D   This test claim was processed through [facility]- copay amounts may vary at other pharmacies due to pharmacy/plan contracts, or as the patient moves through the different stages of their insurance plan.  Diannie Willner, CPHT Pharmacy Patient Advocate Specialist [facility] Patient Advocate Team Direct Number: [phone]  Fax: [phone]       

## 2022-09-16 NOTE — Discharge Summary (Signed)
Discharge Summary    Patient ID: [name] MRN: [id number]; DOB: 1966-04-04  Admit date: 09/14/2022 Discharge date: 09/16/2022  PCP:  Fleet Contras, MD   [facility] Providers Cardiologist:  Verne Carrow, MD     Discharge Diagnoses    Principal Problem:   NSTEMI (non-ST elevated myocardial infarction) Active Problems:   Seizures (HCC)   Hypertension   Dyslipidemia   Obesity (BMI 30-39.9)  Diagnostic Studies/Procedures    Cath: 09/15/2022  1.  Severe proximal LAD stenosis, treated successfully with drug-eluting stent implantation using a 3.5 x 20 mm Synergy DES 2.  Widely patent left main, left circumflex, and RCA with no significant stenoses 3.  Normal LVEDP   Recommendation: Antiplatelet monotherapy with clopidogrel in this patient with aspirin allergy.  Needs to continue at least 12 months and long-term since this will be her only antiplatelet drug.  Diagnostic Dominance: Right  Intervention     Echo: 09/16/2022  IMPRESSIONS     1. Left ventricular ejection fraction, by estimation, is 50 to 55%. The  left ventricle has low normal function. The left ventricle has no regional  wall motion abnormalities. There is moderate concentric left ventricular  hypertrophy. Left ventricular  diastolic parameters are consistent with Grade I diastolic dysfunction  (impaired relaxation). Elevated left ventricular end-diastolic pressure.   2. Right ventricular systolic function is normal. The right ventricular  size is normal. There is normal pulmonary artery systolic pressure. The  estimated right ventricular systolic pressure is 13.9 mmHg.   3. The mitral valve is normal in structure. Trivial mitral valve  regurgitation. No evidence of mitral stenosis.   4. The aortic valve is normal in structure. Aortic valve regurgitation is  not visualized. No aortic stenosis is present.   5. The inferior vena cava is normal in size with greater than 50%   respiratory variability, suggesting right atrial pressure of 3 mmHg.   6. Ascending aorta measurements are within normal limits for age when  indexed to body surface area.   FINDINGS   Left Ventricle: Left ventricular ejection fraction, by estimation, is 50  to 55%. The left ventricle has low normal function. The left ventricle has  no regional wall motion abnormalities. Definity contrast agent was given  IV to delineate the left  ventricular endocardial borders. The left ventricular internal cavity size  was normal in size. There is moderate concentric left ventricular  hypertrophy. Abnormal (paradoxical) septal motion, consistent with left  bundle branch block. Left ventricular  diastolic parameters are consistent with Grade I diastolic dysfunction  (impaired relaxation). Elevated left ventricular end-diastolic pressure.   Right Ventricle: The right ventricular size is normal. No increase in  right ventricular wall thickness. Right ventricular systolic function is  normal. There is normal pulmonary artery systolic pressure. The tricuspid  regurgitant velocity is 1.65 m/s, and   with an assumed right atrial pressure of 3 mmHg, the estimated right  ventricular systolic pressure is 13.9 mmHg.   Left Atrium: Left atrial size was normal in size.   Right Atrium: Right atrial size was normal in size.   Pericardium: There is no evidence of pericardial effusion.   Mitral Valve: The mitral valve is normal in structure. Trivial mitral  valve regurgitation. No evidence of mitral valve stenosis. MV peak  gradient, 4.0 mmHg. The mean mitral valve gradient is 2.0 mmHg.   Tricuspid Valve: The tricuspid valve is normal in structure. Tricuspid  valve regurgitation is trivial. No evidence of tricuspid stenosis.  Aortic Valve: The aortic valve is normal in structure. Aortic valve  regurgitation is not visualized. No aortic stenosis is present.   Pulmonic Valve: The pulmonic valve was normal  in structure. Pulmonic valve  regurgitation is trivial. No evidence of pulmonic stenosis.   Aorta: The aortic root is normal in size and structure. Ascending aorta  measurements are within normal limits for age when indexed to body surface  area.   Venous: The inferior vena cava is normal in size with greater than 50%  respiratory variability, suggesting right atrial pressure of 3 mmHg.   IAS/Shunts: No atrial level shunt detected by color flow Doppler.     _____________   History of Present Illness     [name] is a 57 y.o. female with past medical history of seizures, hypertension, hyperlipidemia, obesity who presented with chest pain and found to have a non-STEMI.  She reported intermittent chest pain for the past week mostly while doing water aerobics.  The day of admission it increased to a 9/10 and radiated into her shoulder.  Also had some shortness of breath.  On arrival to the ED her troponins were noted to be in the 400s and EKG showed some mild ischemic changes in inferior lateral leads.  She was started on a heparin and nitro drip and admitted to cardiology for further management.  Hospital Course     NSTEMI -- Presented with 1 week of intermittent chest pain.  High-sensitivity troponin 299>> 488>> 1011.  EKG showed sinus rhythm, T wave inversions in anterior lateral leads with slight J-point elevation in lead III and aVR.  Has ASA allergy therefore was started on plavix, statin, metoprolol.  Underwent cardiac catheterization noted above with PCI/DES to severe proximal LAD lesion.  Seen by cardiac rehab. -- Continue Plavix, statin, metoprolol, amlodipine   Shared Decision Making/Informed Consent The risks [stroke (1 in 1000), death (1 in 1000), kidney failure [usually temporary] (1 in 500), bleeding (1 in 200), allergic reaction [possibly serious] (1 in 200)], benefits (diagnostic support and management of coronary artery disease) and alternatives of a cardiac  catheterization were discussed in detail with Ms. Cashwell and she is willing to proceed.   HTN -- controlled -- continue metoprolol 25mg  BID, amlodipine 5mg  daily    HLD -- LDL 104, HDL 39 -- continue atorvastatin 80mg  daily    Tobacco use -- cessation encouraged    Chronic back pain -- continue home norco  General: Well developed, well nourished, female appearing in no acute distress. Head: Normocephalic, atraumatic.  Neck: Supple without bruits, JVD. Lungs:  Resp regular and unlabored, CTA. Heart: RRR, S1, S2, no S3, S4, or murmur; no rub. Abdomen: Soft, non-tender, non-distended with normoactive bowel sounds. No hepatomegaly. No rebound/guarding. No obvious abdominal masses. Extremities: No clubbing, cyanosis, edema. Distal pedal pulses are 2+ bilaterally. Right radial cath site stable without bruising or hematoma Neuro: Alert and oriented X 3. Moves all extremities spontaneously. Psych: Normal affect.  Patient was seen by Dr. Clifton James and deemed stable for discharge home. Follow up in the office arranged. Medications sent to [facility] pharmacy. Educated by pharmD prior to discharge.   Did the patient have an acute coronary syndrome (MI, NSTEMI, STEMI, etc) this admission?:  Yes                               AHA/ACC Clinical Performance & Quality Measures: Aspirin prescribed? - No - allergic ADP Receptor  Inhibitor (Plavix/Clopidogrel, Brilinta/Ticagrelor or Effient/Prasugrel) prescribed (includes medically managed patients)? - Yes Beta Blocker prescribed? - Yes High Intensity Statin (Lipitor 40-80mg  or Crestor 20-40mg ) prescribed? - Yes EF assessed during THIS hospitalization? - Yes For EF <40%, was ACEI/ARB prescribed? - Not Applicable (EF >/= 40%) For EF <40%, Aldosterone Antagonist (Spironolactone or Eplerenone) prescribed? - Not Applicable (EF >/= 40%) Cardiac Rehab Phase II ordered (including medically managed patients)? - Yes       The patient will be scheduled for a TOC  follow up appointment in 10-14 days.  A message has been sent to the [facility] and Scheduling Pool at the office where the patient should be seen for follow up.  _____________  Discharge Vitals Blood pressure 129/82, pulse 81, temperature 99.2 F (37.3 C), temperature source Oral, resp. rate 18, height 5\' 4"  (1.626 m), weight 92.3 kg, SpO2 98 %.  Filed Weights   09/14/22 1032 09/14/22 2249 09/15/22 0355  Weight: 92.3 kg 93.5 kg 92.3 kg    Labs & Radiologic Studies    CBC Recent Labs    09/15/22 0320 09/16/22 0207  WBC 6.6 6.3  HGB 11.9* 11.1*  HCT 35.8* 33.2*  MCV 84.8 86.5  PLT 368 328   Basic Metabolic Panel Recent Labs    16/10/96 0320 09/16/22 0207  NA 139 137  K 3.6 3.6  CL 108 108  CO2 24 21*  GLUCOSE 96 131*  BUN 10 11  CREATININE 0.87 0.93  CALCIUM 8.8* 8.5*   Liver Function Tests No results for input(s): "AST", "ALT", "ALKPHOS", "BILITOT", "PROT", "ALBUMIN" in the last 72 hours. No results for input(s): "LIPASE", "AMYLASE" in the last 72 hours. High Sensitivity Troponin:   Recent Labs  Lab 09/14/22 1118 09/14/22 1254 09/15/22 0320  TROPONINIHS 299* 488* 1,011*    BNP Invalid input(s): "POCBNP" D-Dimer Recent Labs    09/14/22 1225  DDIMER 0.41   Hemoglobin A1C No results for input(s): "HGBA1C" in the last 72 hours. Fasting Lipid Panel Recent Labs    09/16/22 0207  CHOL 131  HDL 34*  LDLCALC 84  TRIG 67  CHOLHDL 3.9   Thyroid Function Tests No results for input(s): "TSH", "T4TOTAL", "T3FREE", "THYROIDAB" in the last 72 hours.  Invalid input(s): "FREET3" _____________  ECHOCARDIOGRAM COMPLETE  Result Date: 09/16/2022    ECHOCARDIOGRAM REPORT   Patient Name:   [place] AUDRIANNA DRISKILL Date of Exam: 09/16/2022 Medical Rec #:  [id number]       Height:       64.0 in Accession #:    [id number]      Weight:       203.4 lb Date of Birth:  Aug 14, 1965       BSA:          1.970 m Patient Age:    57 years        BP:           129/82 mmHg Patient Gender: F                HR:           81 bpm. Exam Location:  Inpatient Procedure: 2D Echo, Cardiac Doppler, Color Doppler and Intracardiac            Opacification Agent Indications:    Abnormal ECG  History:        Patient has no prior history of Echocardiogram examinations.                 Signs/Symptoms:Chest Pain  and Shortness of Breath; Risk                 Factors:Current Smoker and Hypertension.  Sonographer:    Milda Smart Referring Phys: [phone] MICHAEL COOPER  Sonographer Comments: Technically difficult study due to poor echo windows. Image acquisition challenging due to patient body habitus and Image acquisition challenging due to respiratory motion. IMPRESSIONS  1. Left ventricular ejection fraction, by estimation, is 50 to 55%. The left ventricle has low normal function. The left ventricle has no regional wall motion abnormalities. There is moderate concentric left ventricular hypertrophy. Left ventricular diastolic parameters are consistent with Grade I diastolic dysfunction (impaired relaxation). Elevated left ventricular end-diastolic pressure.  2. Right ventricular systolic function is normal. The right ventricular size is normal. There is normal pulmonary artery systolic pressure. The estimated right ventricular systolic pressure is 13.9 mmHg.  3. The mitral valve is normal in structure. Trivial mitral valve regurgitation. No evidence of mitral stenosis.  4. The aortic valve is normal in structure. Aortic valve regurgitation is not visualized. No aortic stenosis is present.  5. The inferior vena cava is normal in size with greater than 50% respiratory variability, suggesting right atrial pressure of 3 mmHg.  6. Ascending aorta measurements are within normal limits for age when indexed to body surface area. FINDINGS  Left Ventricle: Left ventricular ejection fraction, by estimation, is 50 to 55%. The left ventricle has low normal function. The left ventricle has no regional wall motion abnormalities. Definity  contrast agent was given IV to delineate the left ventricular endocardial borders. The left ventricular internal cavity size was normal in size. There is moderate concentric left ventricular hypertrophy. Abnormal (paradoxical) septal motion, consistent with left bundle branch block. Left ventricular diastolic parameters are consistent with Grade I diastolic dysfunction (impaired relaxation). Elevated left ventricular end-diastolic pressure. Right Ventricle: The right ventricular size is normal. No increase in right ventricular wall thickness. Right ventricular systolic function is normal. There is normal pulmonary artery systolic pressure. The tricuspid regurgitant velocity is 1.65 m/s, and  with an assumed right atrial pressure of 3 mmHg, the estimated right ventricular systolic pressure is 13.9 mmHg. Left Atrium: Left atrial size was normal in size. Right Atrium: Right atrial size was normal in size. Pericardium: There is no evidence of pericardial effusion. Mitral Valve: The mitral valve is normal in structure. Trivial mitral valve regurgitation. No evidence of mitral valve stenosis. MV peak gradient, 4.0 mmHg. The mean mitral valve gradient is 2.0 mmHg. Tricuspid Valve: The tricuspid valve is normal in structure. Tricuspid valve regurgitation is trivial. No evidence of tricuspid stenosis. Aortic Valve: The aortic valve is normal in structure. Aortic valve regurgitation is not visualized. No aortic stenosis is present. Pulmonic Valve: The pulmonic valve was normal in structure. Pulmonic valve regurgitation is trivial. No evidence of pulmonic stenosis. Aorta: The aortic root is normal in size and structure. Ascending aorta measurements are within normal limits for age when indexed to body surface area. Venous: The inferior vena cava is normal in size with greater than 50% respiratory variability, suggesting right atrial pressure of 3 mmHg. IAS/Shunts: No atrial level shunt detected by color flow Doppler.  LEFT  VENTRICLE PLAX 2D LVIDd:         3.80 cm     Diastology LVIDs:         2.20 cm     LV e' medial:    4.90 cm/s LV PW:         1.20 cm  LV E/e' medial:  12.3 LV IVS:        1.30 cm     LV e' lateral:   3.81 cm/s LVOT diam:     1.80 cm     LV E/e' lateral: 15.8 LV SV:         45 LV SV Index:   23 LVOT Area:     2.54 cm  LV Volumes (MOD) LV vol d, MOD A2C: 63.4 ml LV vol d, MOD A4C: 60.3 ml LV vol s, MOD A2C: 35.1 ml LV vol s, MOD A4C: 25.7 ml LV SV MOD A2C:     28.3 ml LV SV MOD A4C:     60.3 ml LV SV MOD BP:      31.1 ml RIGHT VENTRICLE             IVC RV S prime:     10.80 cm/s  IVC diam: 1.90 cm TAPSE (M-mode): 2.3 cm LEFT ATRIUM             Index        RIGHT ATRIUM          Index LA diam:        2.90 cm 1.47 cm/m   RA Area:     8.44 cm LA Vol (A2C):   30.8 ml 15.63 ml/m  RA Volume:   11.20 ml 5.68 ml/m LA Vol (A4C):   24.8 ml 12.59 ml/m LA Biplane Vol: 28.6 ml 14.51 ml/m  AORTIC VALVE LVOT Vmax:   94.10 cm/s LVOT Vmean:  68.400 cm/s LVOT VTI:    0.175 m  AORTA Ao Root diam: 3.50 cm Ao Asc diam:  3.70 cm MITRAL VALVE               TRICUSPID VALVE MV Area (PHT): 3.34 cm    TR Peak grad:   10.9 mmHg MV Area VTI:   2.32 cm    TR Vmax:        165.00 cm/s MV Peak grad:  4.0 mmHg MV Mean grad:  2.0 mmHg    SHUNTS MV Vmax:       1.00 m/s    Systemic VTI:  0.18 m MV Vmean:      59.3 cm/s   Systemic Diam: 1.80 cm MV Decel Time: 227 msec MV E velocity: 60.30 cm/s MV A velocity: 94.30 cm/s MV E/A ratio:  0.64 Armanda Magic MD Electronically signed by Armanda Magic MD Signature Date/Time: 09/16/2022/11:06:13 AM    Final    CARDIAC CATHETERIZATION  Result Date: 09/16/2022 1.  Severe proximal LAD stenosis, treated successfully with drug-eluting stent implantation using a 3.5 x 20 mm Synergy DES 2.  Widely patent left main, left circumflex, and RCA with no significant stenoses 3.  Normal LVEDP Recommendation: Antiplatelet monotherapy with clopidogrel in this patient with aspirin allergy.  Needs to continue at least  12 months and long-term since this will be her only antiplatelet drug.   CT Angio Chest PE W and/or Wo Contrast  Result Date: 09/14/2022 CLINICAL DATA:  Right chest pain, right shoulder pain EXAM: CT ANGIOGRAPHY CHEST WITH CONTRAST TECHNIQUE: Multidetector CT imaging of the chest was performed using the standard protocol during bolus administration of intravenous contrast. Multiplanar CT image reconstructions and MIPs were obtained to evaluate the vascular anatomy. RADIATION DOSE REDUCTION: This exam was performed according to the departmental dose-optimization program which includes automated exposure control, adjustment of the mA and/or kV according to patient size and/or use of iterative reconstruction  technique. CONTRAST:  OMNIPAQUE IOHEXOL 350 MG/ML SOLN COMPARISON:  Chest radiographs done earlier today FINDINGS: Cardiovascular: There is homogeneous enhancement in thoracic aorta. There are no intraluminal filling defects in pulmonary artery branches. Scattered coronary artery calcifications are seen. Mediastinum/Nodes: No significant lymphadenopathy is seen. Lungs/Pleura: There is no focal pulmonary consolidation. Small linear densities in the lower lung fields may suggest minimal scarring or subsegmental atelectasis. There is no pleural effusion or pneumothorax. Upper Abdomen: Small hiatal hernia is seen. Musculoskeletal: No acute findings are seen. Review of the MIP images confirms the above findings. IMPRESSION: There is no evidence of pulmonary artery embolism. There is no evidence of thoracic aortic dissection. There is no focal pulmonary consolidation. Small hiatal hernia. There are few scattered coronary artery calcifications. Electronically Signed   By: Ernie Avena M.D.   On: 09/14/2022 12:57   DG Chest Portable 1 View  Result Date: 09/14/2022 CLINICAL DATA:  Pain. EXAM: PORTABLE CHEST 1 VIEW COMPARISON:  02/15/2019. FINDINGS: Clear lungs. Normal heart size and mediastinal  contours. No pleural effusion or pneumothorax. Visualized bones and upper abdomen are unremarkable. IMPRESSION: No evidence of acute cardiopulmonary disease. Electronically Signed   By: Orvan Falconer M.D.   On: 09/14/2022 11:16   Disposition   Pt is being discharged home today in good condition.  Follow-up Plans & Appointments     Follow-up Information     Levi Aland, NP Follow up on 09/28/2022.   Specialty: Cardiology Why: at 10am for your follow up appt Contact information: [address] [place] [address] [phone]                Discharge Instructions     Amb Referral to Cardiac Rehabilitation   Complete by: As directed    Diagnosis:  Coronary Stents NSTEMI     After initial evaluation and assessments completed: Virtual Based Care may be provided alone or in conjunction with Phase 2 Cardiac Rehab based on patient barriers.: Yes   Intensive Cardiac Rehabilitation (ICR) [facility] location only OR Traditional Cardiac Rehabilitation (TCR) *If criteria for ICR are not met will enroll in TCR [facility] only): Yes   Call MD for:  redness, tenderness, or signs of infection (pain, swelling, redness, odor or green/yellow discharge around incision site)   Complete by: As directed    Diet - low sodium heart healthy   Complete by: As directed    Discharge instructions   Complete by: As directed    Radial Site Care Refer to this sheet in the next few weeks. These instructions provide you with information on caring for yourself after your procedure. Your caregiver may also give you more specific instructions. Your treatment has been planned according to current medical practices, but problems sometimes occur. Call your caregiver if you have any problems or questions after your procedure. HOME CARE INSTRUCTIONS You may shower the day after the procedure. Remove the bandage (dressing) and gently wash the site with plain soap and water. Gently pat the site dry.  Do not  apply powder or lotion to the site.  Do not submerge the affected site in water for 3 to 5 days.  Inspect the site at least twice daily.  Do not flex or bend the affected arm for 24 hours.  No lifting over 5 pounds (2.3 kg) for 5 days after your procedure.  Do not drive home if you are discharged the same day of the procedure. Have someone else drive you.  You may  drive 24 hours after the procedure unless otherwise instructed by your caregiver.  What to expect: Any bruising will usually fade within 1 to 2 weeks.  Blood that collects in the tissue (hematoma) may be painful to the touch. It should usually decrease in size and tenderness within 1 to 2 weeks.  SEEK IMMEDIATE MEDICAL CARE IF: You have unusual pain at the radial site.  You have redness, warmth, swelling, or pain at the radial site.  You have drainage (other than a small amount of blood on the dressing).  You have chills.  You have a fever or persistent symptoms for more than 72 hours.  You have a fever and your symptoms suddenly get worse.  Your arm becomes pale, cool, tingly, or numb.  You have heavy bleeding from the site. Hold pressure on the site.   PLEASE DO NOT MISS ANY DOSES OF YOUR PLAVIX!!!!! Also keep a log of you blood pressures and bring back to your follow up appt. Please call the office with any questions.   Patients taking blood thinners should generally stay away from medicines like ibuprofen, Advil, Motrin, naproxen, and Aleve due to risk of stomach bleeding. You may take Tylenol as directed or talk to your primary doctor about alternatives.   PLEASE ENSURE THAT YOU DO NOT RUN OUT OF YOUR PLAVIX. This medication is very important to remain on for at least one year. IF you have issues obtaining this medication due to cost please CALL the office 3-5 business days prior to running out in order to prevent missing doses of this medication.   Increase activity slowly   Complete by: As directed         Discharge  Medications   Allergies as of 09/16/2022       Reactions   Aspirin Anaphylaxis, Hives, Swelling   Swelling of lips and mouth    Penicillins Anaphylaxis, Hives, Swelling   Has patient had a PCN reaction causing immediate rash, facial/tongue/throat swelling, SOB or lightheadedness with hypotension: Yes Has patient had a PCN reaction causing severe rash involving mucus membranes or skin necrosis: Yes Has patient had a PCN reaction that required hospitalization No Has patient had a PCN reaction occurring within the last 10 years: No If all of the above answers are "NO", then may proceed with Cephalosporin use.        Medication List     STOP taking these medications    lubiprostone 24 MCG capsule Commonly known as: AMITIZA   simvastatin 10 MG tablet Commonly known as: ZOCOR       TAKE these medications    amLODipine 5 MG tablet Commonly known as: NORVASC Take 5 mg by mouth daily.   atorvastatin 80 MG tablet Commonly known as: LIPITOR Take 1 tablet (80 mg total) by mouth daily. Start taking on: September 17, 2022   carisoprodol 350 MG tablet Commonly known as: SOMA Take 350 mg by mouth daily as needed for muscle spasms.   clopidogrel 75 MG tablet Commonly known as: PLAVIX Take 1 tablet (75 mg total) by mouth daily with breakfast. Start taking on: September 17, 2022   diclofenac Sodium 1 % Gel Commonly known as: VOLTAREN Apply 1 Application topically as needed for moderate pain.   folic acid 400 MCG tablet Commonly known as: FOLVITE Take 400 mcg by mouth daily.   HYDROcodone-acetaminophen 10-325 MG tablet Commonly known as: NORCO Take 1 tablet by mouth up to 4 (four) times daily as needed for severe pain only.  levETIRAcetam 500 MG tablet Commonly known as: KEPPRA Take 1 tablet (500 mg total) by mouth 2 (two) times daily.   metoprolol tartrate 25 MG tablet Commonly known as: LOPRESSOR Take 1 tablet (25 mg total) by mouth 2 (two) times daily.   Mounjaro 2.5  MG/0.5ML Pen Generic drug: tirzepatide Inject 2.5 mg into the skin once a week.   nitroGLYCERIN 0.4 MG SL tablet Commonly known as: NITROSTAT Place 1 tablet (0.4 mg total) under the tongue every 5 (five) minutes x 3 doses as needed for chest pain.   pantoprazole 40 MG tablet Commonly known as: PROTONIX Take 40 mg by mouth daily before breakfast.   promethazine 25 MG tablet Commonly known as: PHENERGAN Take 1 tablet (25 mg total) by mouth every 6 (six) hours as needed.   promethazine 6.25 MG/5ML syrup Commonly known as: PHENERGAN Take 20 mLs (25 mg total) by mouth every 8 (eight) hours as needed for nausea or vomiting.   Relistor 150 MG Tabs Generic drug: Methylnaltrexone Bromide Take 3 tablets by mouth every morning.   solifenacin 10 MG tablet Commonly known as: VESICARE Take 10 mg by mouth daily.        Outstanding Labs/Studies   FLP/LFTs in 8 weeks   Duration of Discharge Encounter   Greater than 30 minutes including physician time.  Signed, Laverda Page, NP 09/16/2022, 1:13 PM  I have personally seen and examined this patient. I agree with the assessment and plan as outlined above.  Doing well post PCI. No chest pain. Echo by my review with normal LV function, no wall motion abnormalities. Official review pending. BP stable. Sinus on tele.  Will d/c home today on Plavix. (ASA allergy).   Verne Carrow, MD, [facility] 09/16/2022 1:13 PM

## 2022-09-16 NOTE — Progress Notes (Signed)
CARDIAC REHAB PHASE I   Pt resting in bed, c/o of headache. Declined walk for now. RN to give medication for headache and agreed to walk later this morning. Pt reports ambulating without SOB or CP in room. Informed pt she needs walk in hall as well. Post MI/stent education including site care, restrictions, risk factors, MI booklet, antiplatelet therapy importance, exercise guidelines, heart heathy diet, smoking cessation and CRP2 reviewed. All questions and concerns addressed. Will refer to [facility] for CRP2. Will continue to follow.    [id number] Woodroe Chen, RN BSN 09/16/2022 9:33 AM

## 2022-09-17 LAB — HEMOGLOBIN A1C
Hgb A1c MFr Bld: 6.1 % — ABNORMAL HIGH (ref 4.8–5.6)
Mean Plasma Glucose: 128 mg/dL

## 2022-09-26 NOTE — Progress Notes (Unsigned)
Cardiology Office Note:    Date:  09/28/2022   ID:  [name], DOB July 07, 1965, MRN [id number]  PCP:  Fleet Contras, MD   [facility] Providers Cardiologist:  Verne Carrow, MD     Referring MD: Fleet Contras, MD   Chief Complaint: hospital follow-up PCI  History of Present Illness:    [name] is a very pleasant 57 y.o. female with a hx of CAD s/p NSTEMI, tobacco abuse, HTN, seizures, HLD, obesity, allergy to aspirin, and chronic back pain treated with narcotic therapy.   She was referred to cardiology and seen by Dr. Antoine Poche 05/12/2019 for preoperative clearance for bariatric surgery.  She had an abnormal EKG that revealed possible old anteroseptal infarct. EKG abnormality felt to be improper lead placement and she was cleared to proceed with surgery.  She presented to ED 09/14/22 with 1 week of intermittent chest pain while doing water aerobics.  On the day of admission it increased to 9/10 and radiated into her shoulder, she additionally had some shortness of breath. Hs troponin elevated at 299 >> 488 >> 1011. EKG showed sinus rhythm, T wave inversions in anterior lateral leads with slight J-point elevation in lead III and aVR.  [facility] 09/15/2022 revealed severe proximal LAD stenosis treated successfully with DES, widely patent left main, left circumflex, and RCA with no significant stenoses, normal LVEDP. Recommendation antiplatelet monotherapy with clopidogrel due to aspirin allergy, needs to continue at least 12 months since this will be her only antiplatelet drug. She was discharged 09/16/22 on atorvastatin 80 mg daily (switched from simvastatin), clopidogrel 75 mg daily, metoprolol 25 mg twice daily and continued on amlodipine 5 mg daily.   Today, she is here for follow-up and is accompanied by her 2 sons. She reports she is feeling well and has had no further episodes of the chest pain that led her to go to the hospital. She reports angina was mid-sternal chest pain  that gradually worsened and started to radiate into her back and down left arm.  She is gradually increasing her activity around home but has not resumed exercise, is eager to do so. Monitors BP at home which has been well-controlled.  Is working on smoking less, down to 3 cigarettes/day. Working on [employer] as well. Sleeps on multiple pillows for many years due to feeling like she cannot breath when she lies flat. Her son says her head is in an awkward position the way she sleeps.  She denies shortness of breath, edema, PND, early satiety. No change in symptoms recently and weight has remained stable. No problems with medications. No presyncope, syncope.   Past Medical History:  Diagnosis Date   Arthritis    Carpal tunnel syndrome    chronoic muscle spasms  & back pain   Chronic back pain    Fibroids    uterine   GERD (gastroesophageal reflux disease)    Hypertension    Seizures (HCC)    last seizure 4-5 years ago/on keppra   Single stillborn delivery outcome    Smoker     Past Surgical History:  Procedure Laterality Date   ABDOMINAL HYSTERECTOMY     BACK SURGERY  02/2017   CESAREAN SECTION     1 time   CORONARY STENT INTERVENTION N/A 09/15/2022   Procedure: CORONARY STENT INTERVENTION;  Surgeon: [name], MD;  Location: [facility] INVASIVE CV LAB;  Service: Cardiovascular;  Laterality: N/A;   LEFT HEART CATH AND CORONARY ANGIOGRAPHY N/A 09/15/2022  Procedure: LEFT HEART CATH AND CORONARY ANGIOGRAPHY;  Surgeon: [name], MD;  Location: [facility] INVASIVE CV LAB;  Service: Cardiovascular;  Laterality: N/A;   LUMBAR LAMINECTOMY/DECOMPRESSION MICRODISCECTOMY Left 02/03/2017   Procedure: LEFT SIDED LUMBAR 5-SACRUM 1 MICRODISECTOMY;  Surgeon: [name], MD;  Location: [facility] OR;  Service: Orthopedics;  Laterality: Left;  LEFT SIDED LUMBAR 5-SACRUM 1 MICRODISECTOMY;    MYOMECTOMY     TUBAL LIGATION      Current Medications: Current Meds  Medication Sig   amLODipine (NORVASC) 5 MG  tablet Take 5 mg by mouth daily.   atorvastatin (LIPITOR) 80 MG tablet Take 1 tablet (80 mg total) by mouth daily.   carisoprodol (SOMA) 350 MG tablet Take 350 mg by mouth daily as needed for muscle spasms.   clopidogrel (PLAVIX) 75 MG tablet Take 1 tablet (75 mg total) by mouth daily with breakfast.   diclofenac Sodium (VOLTAREN) 1 % GEL Apply 1 Application topically as needed for moderate pain.   folic acid (FOLVITE) 400 MCG tablet Take 400 mcg by mouth daily.   HYDROcodone-acetaminophen (NORCO) 10-325 MG tablet Take 1 tablet by mouth up to 4 (four) times daily as needed for severe pain only.   levETIRAcetam (KEPPRA) 500 MG tablet Take 1 tablet (500 mg total) by mouth 2 (two) times daily.   metoprolol tartrate (LOPRESSOR) 25 MG tablet Take 1 tablet (25 mg total) by mouth 2 (two) times daily.   MOUNJARO 2.5 MG/0.5ML Pen Inject 2.5 mg into the skin once a week.   nitroGLYCERIN (NITROSTAT) 0.4 MG SL tablet Place 1 tablet (0.4 mg total) under the tongue every 5 (five) minutes x 3 doses as needed for chest pain.   pantoprazole (PROTONIX) 40 MG tablet Take 40 mg by mouth daily before breakfast.    promethazine (PHENERGAN) 25 MG tablet Take 1 tablet (25 mg total) by mouth every 6 (six) hours as needed.   promethazine (PHENERGAN) 6.25 MG/5ML syrup Take 20 mLs (25 mg total) by mouth every 8 (eight) hours as needed for nausea or vomiting.   RELISTOR 150 MG TABS Take 3 tablets by mouth every morning.   solifenacin (VESICARE) 10 MG tablet Take 10 mg by mouth daily.     Allergies:   Aspirin and Penicillins   Social History   Socioeconomic History   Marital status: Single    Spouse name: Not on file   Number of children: Not on file   Years of education: Not on file   Highest education level: Not on file  Occupational History   Not on file  Tobacco Use   Smoking status: Every Day    Packs/day: 0.50    Years: 30.00    Additional pack years: 0.00    Total pack years: 15.00    Types: Cigarettes    Smokeless tobacco: Never  Vaping Use   Vaping Use: Never used  Substance and Sexual Activity   Alcohol use: No   Drug use: No   Sexual activity: Not on file  Other Topics Concern   Not on file  Social History Narrative   Lives with son.  She has another son.  One grandchild.     Social Determinants of Health   Financial Resource Strain: Not on file  Food Insecurity: No Food Insecurity (03/24/2022)   Hunger Vital Sign    Worried About Running Out of Food in the Last Year: Never true    Ran Out of Food in the Last Year: Never true  Transportation Needs: No  Transportation Needs (03/24/2022)   PRAPARE - Administrator, Civil Service (Medical): No    Lack of Transportation (Non-Medical): No  Physical Activity: Not on file  Stress: Not on file  Social Connections: Not on file     Family History: The patient's family history includes Breast cancer in her sister; CAD in an other family member; Diabetes in her father; Diabetes Mellitus II in her father; Hypertension in her brother, father, mother, sister, sister, and sister.  ROS:   Please see the history of present illness.   + mild chest and upper back discomfort All other systems reviewed and are negative.  Labs/Other Studies Reviewed:    The following studies were reviewed today:  LHC 09/16/22 1.  Severe proximal LAD stenosis, treated successfully with drug-eluting stent implantation using a 3.5 x 20 mm Synergy DES 2.  Widely patent left main, left circumflex, and RCA with no significant stenoses 3.  Normal LVEDP   Recommendation: Antiplatelet monotherapy with clopidogrel in this patient with aspirin allergy.  Needs to continue at least 12 months and long-term since this will be her only antiplatelet drug.   Echo 09/16/22 1. Left ventricular ejection fraction, by estimation, is 50 to 55%. The  left ventricle has low normal function. The left ventricle has no regional  wall motion abnormalities. There is  moderate concentric left ventricular  hypertrophy. Left ventricular  diastolic parameters are consistent with Grade I diastolic dysfunction  (impaired relaxation). Elevated left ventricular end-diastolic pressure.   2. Right ventricular systolic function is normal. The right ventricular  size is normal. There is normal pulmonary artery systolic pressure. The  estimated right ventricular systolic pressure is 13.9 mmHg.   3. The mitral valve is normal in structure. Trivial mitral valve  regurgitation. No evidence of mitral stenosis.   4. The aortic valve is normal in structure. Aortic valve regurgitation is  not visualized. No aortic stenosis is present.   5. The inferior vena cava is normal in size with greater than 50%  respiratory variability, suggesting right atrial pressure of 3 mmHg.   6. Ascending aorta measurements are within normal limits for age when  indexed to body surface area.    Recent Labs: 03/26/2022: Magnesium 1.6 09/07/2022: ALT 13; TSH 1.72 09/16/2022: BUN 11; Creatinine, Ser 0.93; Hemoglobin 11.1; Platelets 328; Potassium 3.6; Sodium 137  Recent Lipid Panel    Component Value Date/Time   CHOL 131 09/16/2022 0207   TRIG 67 09/16/2022 0207   HDL 34 (L) 09/16/2022 0207   CHOLHDL 3.9 09/16/2022 0207   VLDL 13 09/16/2022 0207   LDLCALC 84 09/16/2022 0207   LDLCALC 104 (H) 09/07/2022 0000     Risk Assessment/Calculations:           Physical Exam:    VS:  BP 122/80   Pulse 73   Ht 5\' 4"  (1.626 m)   Wt 209 lb 3.2 oz (94.9 kg)   SpO2 98%   BMI 35.91 kg/m     Wt Readings from Last 3 Encounters:  09/28/22 209 lb 3.2 oz (94.9 kg)  09/15/22 203 lb 6.4 oz (92.3 kg)  03/24/22 203 lb 8 oz (92.3 kg)     GEN:  Well nourished, well developed in no acute distress HEENT: Normal NECK: No JVD; No carotid bruits CARDIAC: RRR, no murmurs, rubs, gallops RESPIRATORY:  Clear to auscultation without rales, wheezing or rhonchi  ABDOMEN: Soft, non-tender,  non-distended MUSCULOSKELETAL:  No edema; No deformity. 2+ pedal pulses, equal bilaterally  SKIN: Warm and dry NEUROLOGIC:  Alert and oriented x 3 PSYCHIATRIC:  Normal affect   EKG:  EKG is ordered today.  The ekg ordered today demonstrates normal sinus rhythm at 70 bpm, low voltage QRS, nonspecific T wave abnormality, Q wave leads III, aVF, consistent with prior infarct, no acute change from previous tracing   Diagnoses:    1. Coronary artery disease involving native coronary artery of native heart without angina pectoris   2. Hyperlipidemia LDL goal <50   3. Tobacco abuse   4. Primary hypertension   5. NSTEMI (non-ST elevated myocardial infarction) (HCC)   6. Orthopnea    Assessment and Plan:     CAD without angina: s/p NSTEMI with PCI/DES to LAD 09/15/22. She is overall feeling well with occasional mild chest discomfort and upper back discomfort. Suspect musculoskeletal pain from position she sleeps in. States discomfort does not feel like previous angina. Encouraged her to gradually increase physical activity and notify us if she has worsening symptoms. Is eager to start cardiac rehab. No indication for further ischemic evaluation at this time. No problems with medications. No bleeding concerns. Not on aspirin 2/2 allergy. Continue GDMT including metoprolol, amlodipine, clopidogrel, atorvastatin.  Hyperlipidemia LDL goal < 55: LDL 84 on 4/24. Previously on simvastatin, switched to atorvastatin during hospitalization. We will recheck lipids/liver enzymes in 2 months. Brief discussion about alternative treatments if indicated at time of recheck. Encouraged heart healthy, mostly plant based diet and 150 minutes moderate intensity exercise each week.  Continue atorvastatin.   Orthopnea: She reports sleeping on multiple pillows for many years due to feeling like she cannot breathe when she is lying flat. No change recently in # of pillows. No edema, weight gain, dyspnea on exertion, or PND.  LVEF 50-55% on echo 09/16/22. She appears euvolemic on exam.  This is causing her some upper back and neck discomfort so she is going to trial sleeping on one less pillow. We discussed symptoms of worsening heart failure and I encouraged her to report any concerns in the future.   Hypertension: BP is well controlled.  Tobacco abuse: She has reduced smoking to 3 cigarettes daily. I congratulated her on achievement thus far and continued to recommend complete cessation.     Cardiac Rehabilitation Eligibility Assessment  The patient is ready to start cardiac rehabilitation from a cardiac standpoint.       Disposition: 3-4 months with Dr. Clifton James or APP  Medication Adjustments/Labs and Tests Ordered: Current medicines are reviewed at length with the patient today.  Concerns regarding medicines are outlined above.  Orders Placed This Encounter  Procedures   Lipid panel   Comprehensive metabolic panel   EKG 12-Lead   No orders of the defined types were placed in this encounter.   Patient Instructions  Medication Instructions:  Your physician recommends that you continue on your current medications as directed. Please refer to the Current Medication list given to you today.  *If you need a refill on your cardiac medications before your next appointment, please call your pharmacy*   Lab Work: Your physician recommends that you return in 2 months to have the following labs drawn: Lipid and CMET If you have labs (blood work) drawn today and your tests are completely normal, you will receive your results only by: MyChart Message (if you have MyChart) OR A paper copy in the mail If you have any lab test that is abnormal or we need to change your treatment, we will call you to  review the results.   Testing/Procedures: NONE   Follow-Up: At [facility], you and your health needs are our priority.  As part of our continuing mission to provide you with exceptional heart care, we  have created designated Provider Care Teams.  These Care Teams include your primary Cardiologist (physician) and Advanced Practice Providers (APPs -  Physician Assistants and Nurse Practitioners) who all work together to provide you with the care you need, when you need it.  We recommend signing up for the patient portal called "MyChart".  Sign up information is provided on this After Visit Summary.  MyChart is used to connect with patients for Virtual Visits (Telemedicine).  Patients are able to view lab/test results, encounter notes, upcoming appointments, etc.  Non-urgent messages can be sent to your provider as well.   To learn more about what you can do with MyChart, go to [email or web address].    Your next appointment:   3-4 month(s)  Provider:   Eligha Bridegroom, NP OR Verne Carrow, MD    Signed, Levi Aland, NP  09/28/2022 11:32 AM    [facility]

## 2022-09-28 ENCOUNTER — Encounter: Payer: Self-pay | Admitting: Nurse Practitioner

## 2022-09-28 ENCOUNTER — Ambulatory Visit: Payer: 59 | Attending: Nurse Practitioner | Admitting: Nurse Practitioner

## 2022-09-28 VITALS — BP 122/80 | HR 73 | Ht 64.0 in | Wt 209.2 lb

## 2022-09-28 DIAGNOSIS — E785 Hyperlipidemia, unspecified: Secondary | ICD-10-CM | POA: Diagnosis not present

## 2022-09-28 DIAGNOSIS — R0601 Orthopnea: Secondary | ICD-10-CM

## 2022-09-28 DIAGNOSIS — I214 Non-ST elevation (NSTEMI) myocardial infarction: Secondary | ICD-10-CM

## 2022-09-28 DIAGNOSIS — Z720 Tobacco use: Secondary | ICD-10-CM | POA: Diagnosis not present

## 2022-09-28 DIAGNOSIS — I10 Essential (primary) hypertension: Secondary | ICD-10-CM

## 2022-09-28 DIAGNOSIS — I2510 Atherosclerotic heart disease of native coronary artery without angina pectoris: Secondary | ICD-10-CM | POA: Diagnosis not present

## 2022-09-28 NOTE — Patient Instructions (Signed)
Medication Instructions:  Your physician recommends that you continue on your current medications as directed. Please refer to the Current Medication list given to you today.  *If you need a refill on your cardiac medications before your next appointment, please call your pharmacy*   Lab Work: Your physician recommends that you return in 2 months to have the following labs drawn: Lipid and CMET If you have labs (blood work) drawn today and your tests are completely normal, you will receive your results only by: MyChart Message (if you have MyChart) OR A paper copy in the mail If you have any lab test that is abnormal or we need to change your treatment, we will call you to review the results.   Testing/Procedures: NONE   Follow-Up: At [facility], you and your health needs are our priority.  As part of our continuing mission to provide you with exceptional heart care, we have created designated Provider Care Teams.  These Care Teams include your primary Cardiologist (physician) and Advanced Practice Providers (APPs -  Physician Assistants and Nurse Practitioners) who all work together to provide you with the care you need, when you need it.  We recommend signing up for the patient portal called "MyChart".  Sign up information is provided on this After Visit Summary.  MyChart is used to connect with patients for Virtual Visits (Telemedicine).  Patients are able to view lab/test results, encounter notes, upcoming appointments, etc.  Non-urgent messages can be sent to your provider as well.   To learn more about what you can do with MyChart, go to [email or web address].    Your next appointment:   3-4 month(s)  Provider:   Eligha Bridegroom, NP OR Verne Carrow, MD

## 2022-10-01 ENCOUNTER — Telehealth (HOSPITAL_COMMUNITY): Payer: Self-pay

## 2022-10-01 NOTE — Telephone Encounter (Signed)
Pt insurance is active and benefits verified through [facility] [employer] Dual Complete. Co-pay $0.00, DED $240.00/$240.00 met, out of pocket $8,850.00/$408.24 met, co-insurance 20%. No pre-authorization required. Passport, 10/01/2022 @ 2:48PM, REF#[id number]   How many CR sessions are covered? (36 sessions/visits for TCR, 72 sessions/visits for ICR)72 visits Is this a lifetime maximum or an annual maximum? Annual Has the member used any of these services to date? No Is there a time limit (weeks/months) on start of program and/or program completion? No     Will contact patient to see if she is interested in the Cardiac Rehab Program.

## 2022-10-01 NOTE — Telephone Encounter (Signed)
Called patient to see if she was interested in participating in the Cardiac Rehab Program. Patient stated yes. Patient will come in for orientation on 10/12/2022 @ 10AM and will attend the 1:45PM exercise class. Went over insurance, patient verbalized understanding.   Pensions consultant.

## 2022-10-06 ENCOUNTER — Telehealth (HOSPITAL_COMMUNITY): Payer: Self-pay

## 2022-10-06 NOTE — Telephone Encounter (Signed)
LVM to call [phone] regarding cardiac rehab.

## 2022-10-12 ENCOUNTER — Encounter (HOSPITAL_COMMUNITY)
Admission: RE | Admit: 2022-10-12 | Discharge: 2022-10-12 | Disposition: A | Discharge status: Home / Self Care | Payer: 59 | Source: Ambulatory Visit | Attending: Cardiovascular Disease | Admitting: Cardiovascular Disease

## 2022-10-12 ENCOUNTER — Telehealth (HOSPITAL_COMMUNITY): Payer: Self-pay

## 2022-10-12 ENCOUNTER — Encounter (HOSPITAL_COMMUNITY): Payer: Self-pay

## 2022-10-12 VITALS — BP 124/82 | HR 63 | Ht 64.0 in | Wt 214.3 lb

## 2022-10-12 DIAGNOSIS — I214 Non-ST elevation (NSTEMI) myocardial infarction: Secondary | ICD-10-CM | POA: Insufficient documentation

## 2022-10-12 DIAGNOSIS — Z955 Presence of coronary angioplasty implant and graft: Secondary | ICD-10-CM

## 2022-10-12 NOTE — Progress Notes (Signed)
Cardiac Rehab Medication Review   Does the patient  feel that his/her medications are working for him/her?  YES  Has the patient been experiencing any side effects to the medications prescribed?  NO  Does the patient measure his/her own blood pressure or blood glucose at home?  YES   Does the patient have any problems obtaining medications due to transportation or finances?  NO  Understanding of regimen: excellent Understanding of indications: good Potential of compliance: excellent    Comments: Danene has a great understanding of her medication regime. She checks her BP every morning.     Jonna Coup, MS 10/12/2022 11:03 AM

## 2022-10-12 NOTE — Progress Notes (Signed)
Cardiac Individual Treatment Plan  Patient Details  Name: [name] MRN: [id number] Date of Birth: 1966-02-18 Referring Provider:   Flowsheet Row INTENSIVE CARDIAC REHAB ORIENT from 10/12/2022 in [facility]  Referring Provider Verne Carrow, MD       Initial Encounter Date:  Flowsheet Row INTENSIVE CARDIAC REHAB ORIENT from 10/12/2022 in [facility]  Date 10/12/22       Visit Diagnosis: 09/14/22 NSTEMI (non-ST elevated myocardial infarction) (HCC)  09/15/22 DES LAD  Patient's Home Medications on Admission:  Current Outpatient Medications:    amLODipine (NORVASC) 5 MG tablet, Take 5 mg by mouth daily., Disp: , Rfl:    atorvastatin (LIPITOR) 80 MG tablet, Take 1 tablet (80 mg total) by mouth daily., Disp: 90 tablet, Rfl: 1   carisoprodol (SOMA) 350 MG tablet, Take 350 mg by mouth daily as needed for muscle spasms., Disp: , Rfl:    clopidogrel (PLAVIX) 75 MG tablet, Take 1 tablet (75 mg total) by mouth daily with breakfast., Disp: 90 tablet, Rfl: 2   diclofenac Sodium (VOLTAREN) 1 % GEL, Apply 1 Application topically as needed for moderate pain., Disp: , Rfl:    folic acid (FOLVITE) 400 MCG tablet, Take 400 mcg by mouth daily., Disp: , Rfl:    HYDROcodone-acetaminophen (NORCO) 10-325 MG tablet, Take 1 tablet by mouth up to 4 (four) times daily as needed for severe pain only., Disp: 120 tablet, Rfl: 0   levETIRAcetam (KEPPRA) 500 MG tablet, Take 1 tablet (500 mg total) by mouth 2 (two) times daily., Disp: 60 tablet, Rfl: 3   metoprolol tartrate (LOPRESSOR) 25 MG tablet, Take 1 tablet (25 mg total) by mouth 2 (two) times daily., Disp: 180 tablet, Rfl: 1   nitroGLYCERIN (NITROSTAT) 0.4 MG SL tablet, Place 1 tablet (0.4 mg total) under the tongue every 5 (five) minutes x 3 doses as needed for chest pain., Disp: 25 tablet, Rfl: 2   pantoprazole (PROTONIX) 40 MG  tablet, Take 40 mg by mouth daily before breakfast. , Disp: , Rfl:    promethazine (PHENERGAN) 25 MG tablet, Take 1 tablet (25 mg total) by mouth every 6 (six) hours as needed., Disp: 30 tablet, Rfl: 0   promethazine (PHENERGAN) 6.25 MG/5ML syrup, Take 20 mLs (25 mg total) by mouth every 8 (eight) hours as needed for nausea or vomiting., Disp: 120 mL, Rfl: 1   RELISTOR 150 MG TABS, Take 3 tablets by mouth every morning., Disp: , Rfl:    solifenacin (VESICARE) 10 MG tablet, Take 10 mg by mouth daily., Disp: , Rfl:    MOUNJARO 2.5 MG/0.5ML Pen, Inject 2.5 mg into the skin once a week. (Patient not taking: Reported on 10/12/2022), Disp: , Rfl:   Past Medical History: Past Medical History:  Diagnosis Date   Arthritis    Carpal tunnel syndrome    chronoic muscle spasms  & back pain   Chronic back pain    Fibroids    uterine   GERD (gastroesophageal reflux disease)    Hypertension    Seizures (HCC)    last seizure 4-5 years ago/on keppra   Single stillborn delivery outcome    Smoker     Tobacco Use: Social History   Tobacco Use  Smoking Status Every Day   Packs/day: 0.50   Years: 30.00   Additional pack years: 0.00   Total pack years: 15.00   Types: Cigarettes  Smokeless  Tobacco Never    Labs: Review Flowsheet  More data exists      Latest Ref Rng & Units 11/30/2007 12/05/2007 11/01/2020 09/07/2022 09/16/2022  Labs for ITP Cardiac and Pulmonary Rehab  Cholestrol 0 - 200 mg/dL - - 213  086  578   LDL (calc) 0 - 99 mg/dL - - 469  629  84   HDL-C >40 mg/dL - - 44  39  34   Trlycerides <150 mg/dL - - 528  413  67   Hemoglobin A1c 4.8 - 5.6 % - - - - 6.1   PH, Arterial - - 7.462  - - -  PCO2 arterial - - 31.2  - - -  Bicarbonate - - 22.0  - - -  TCO2 - 21  20.1  - - -  Acid-base deficit - - 0.7  - - -  O2 Saturation - - 99.7  - - -    Capillary Blood Glucose: Lab Results  Component Value Date   GLUCAP 98 09/16/2022   GLUCAP 116 (H) 07/26/2012     Exercise Target  Goals: Exercise Program Goal: Individual exercise prescription set using results from initial 6 min walk test and THRR while considering  patient's activity barriers and safety.   Exercise Prescription Goal: Initial exercise prescription builds to 30-45 minutes a day of aerobic activity, 2-3 days per week.  Home exercise guidelines will be given to patient during program as part of exercise prescription that the participant will acknowledge.  Activity Barriers & Risk Stratification:  Activity Barriers & Cardiac Risk Stratification - 10/12/22 1348       Activity Barriers & Cardiac Risk Stratification   Activity Barriers Balance Concerns;Deconditioning;Joint Problems;Assistive Device;Arthritis;Back Problems    Cardiac Risk Stratification High   <5 METs on            6 Minute Walk:  6 Minute Walk     Row Name 10/12/22 1343         6 Minute Walk   Phase Initial     Distance 480 feet     Walk Time 6 minutes     # of Rest Breaks 1  4:30-6:00, pt became lightheaded     MPH 0.91     METS 1.4     RPE 11     Perceived Dyspnea  0     VO2 Peak 4.92     Symptoms Yes (comment)     Comments pt became lightheaded at min 4:30, had not eaten prior to visit. Sat down, given banana and lemon muffin, resolved. No pain.     Resting HR 60 bpm     Resting BP 124/82     Resting Oxygen Saturation  98 %     Exercise Oxygen Saturation  during 6 min walk 99 %     Max Ex. HR 79 bpm     Max Ex. BP 136/88     2 Minute Post BP 128/86              Oxygen Initial Assessment:   Oxygen Re-Evaluation:   Oxygen Discharge (Final Oxygen Re-Evaluation):   Initial Exercise Prescription:  Initial Exercise Prescription - 10/12/22 1300       Date of Initial Exercise RX and Referring Provider   Date 10/12/22    Referring Provider Verne Carrow, MD    Expected Discharge Date 12/25/22      NuStep   Level 1    SPM 65  Minutes 15    METs 1.5      Prescription Details    Frequency (times per week) 3    Duration Progress to 30 minutes of continuous aerobic without signs/symptoms of physical distress      Intensity   THRR 40-80% of Max Heartrate 64-129    Ratings of Perceived Exertion 11-13    Perceived Dyspnea 0-4      Progression   Progression Continue progressive overload as per policy without signs/symptoms or physical distress.      Resistance Training   Training Prescription Yes    Weight 2    Reps 10-15             Perform Capillary Blood Glucose checks as needed.  Exercise Prescription Changes:   Exercise Comments:   Exercise Goals and Review:   Exercise Goals     Row Name 10/12/22 1039             Exercise Goals   Increase Physical Activity Yes       Intervention Provide advice, education, support and counseling about physical activity/exercise needs.;Develop an individualized exercise prescription for aerobic and resistive training based on initial evaluation findings, risk stratification, comorbidities and participant's personal goals.       Expected Outcomes Short Term: Attend rehab on a regular basis to increase amount of physical activity.;Long Term: Exercising regularly at least 3-5 days a week.;Long Term: Add in home exercise to make exercise part of routine and to increase amount of physical activity.       Increase Strength and Stamina Yes       Intervention Provide advice, education, support and counseling about physical activity/exercise needs.;Develop an individualized exercise prescription for aerobic and resistive training based on initial evaluation findings, risk stratification, comorbidities and participant's personal goals.       Expected Outcomes Short Term: Increase workloads from initial exercise prescription for resistance, speed, and METs.;Short Term: Perform resistance training exercises routinely during rehab and add in resistance training at home;Long Term: Improve cardiorespiratory fitness, muscular  endurance and strength as measured by increased METs and functional capacity ( )       Able to understand and use rate of perceived exertion (RPE) scale Yes       Intervention Provide education and explanation on how to use RPE scale       Expected Outcomes Short Term: Able to use RPE daily in rehab to express subjective intensity level;Long Term:  Able to use RPE to guide intensity level when exercising independently       Knowledge and understanding of Target Heart Rate Range (THRR) Yes       Intervention Provide education and explanation of THRR including how the numbers were predicted and where they are located for reference       Expected Outcomes Short Term: Able to state/look up THRR;Short Term: Able to use daily as guideline for intensity in rehab;Long Term: Able to use THRR to govern intensity when exercising independently       Understanding of Exercise Prescription Yes       Intervention Provide education, explanation, and written materials on patient's individual exercise prescription       Expected Outcomes Short Term: Able to explain program exercise prescription;Long Term: Able to explain home exercise prescription to exercise independently                Exercise Goals Re-Evaluation :   Discharge Exercise Prescription (Final Exercise Prescription Changes):   Nutrition:  Target  Goals: Understanding of nutrition guidelines, daily intake of sodium 1500mg , cholesterol 200mg , calories 30% from fat and 7% or less from saturated fats, daily to have 5 or more servings of fruits and vegetables.  Biometrics:  Pre Biometrics - 10/12/22 1056       Pre Biometrics   Waist Circumference 45.5 inches    Hip Circumference 50.5 inches    Waist to Hip Ratio 0.9 %    Triceps Skinfold 42 mm    % Body Fat 49.1 %    Grip Strength 20 kg    Flexibility 0 in   not done due to low back issues   Single Leg Stand 1.5 seconds              Nutrition Therapy Plan and Nutrition  Goals:   Nutrition Assessments:  MEDIFICTS Score Key: ?70 Need to make dietary changes  40-70 Heart Healthy Diet ? 40 Therapeutic Level Cholesterol Diet    Picture Your Plate Scores: <16 Unhealthy dietary pattern with much room for improvement. 41-50 Dietary pattern unlikely to meet recommendations for good health and room for improvement. 51-60 More healthful dietary pattern, with some room for improvement.  >60 Healthy dietary pattern, although there may be some specific behaviors that could be improved.    Nutrition Goals Re-Evaluation:   Nutrition Goals Re-Evaluation:   Nutrition Goals Discharge (Final Nutrition Goals Re-Evaluation):   Psychosocial: Target Goals: Acknowledge presence or absence of significant depression and/or stress, maximize coping skills, provide positive support system. Participant is able to verbalize types and ability to use techniques and skills needed for reducing stress and depression.  Initial Review & Psychosocial Screening:  Initial Psych Review & Screening - 10/12/22 1112       Initial Review   Current issues with Current Stress Concerns    Source of Stress Concerns Chronic Illness;Retirement/disability    Comments [name] stated that she is not currently depressed or anxious, however does have some slight feelings of stress related to her health and work. [name] shared that her mother passed away from sudden cardiac arrest and that her own event caused a lot of fear due to this. [name] also shared that she has been on disability for around 20 years. She is very motivated for cardiac rehab and has a positive mindset entering the program.      Family Dynamics   Good Support System? Yes   [name] has her sons and large family for support     Barriers   Psychosocial barriers to participate in program The patient should benefit from training in stress management and relaxation.      Screening Interventions   Interventions Encouraged to  exercise;Provide feedback about the scores to participant;To provide support and resources with identified psychosocial needs    Expected Outcomes Long Term Goal: Stressors or current issues are controlled or eliminated.;Short Term goal: Identification and review with participant of any Quality of Life or Depression concerns found by scoring the questionnaire.;Long Term goal: The participant improves quality of Life and PHQ9 Scores as seen by post scores and/or verbalization of changes;Short Term goal: Utilizing psychosocial counselor, staff and physician to assist with identification of specific Stressors or current issues interfering with healing process. Setting desired goal for each stressor or current issue identified.             Quality of Life Scores:  Quality of Life - 10/12/22 1354       Quality of Life   Select Quality of Life  Quality of Life Scores   Health/Function Pre 23.6 %    Socioeconomic Pre 25.17 %    Psych/Spiritual Pre 24.86 %    Family Pre 28.5 %    GLOBAL Pre 24.78 %            Scores of 19 and below usually indicate a poorer quality of life in these areas.  A difference of  2-3 points is a clinically meaningful difference.  A difference of 2-3 points in the total score of the Quality of Life Index has been associated with significant improvement in overall quality of life, self-image, physical symptoms, and general health in studies assessing change in quality of life.  PHQ-9: Review Flowsheet       10/12/2022 03/29/2019  Depression screen PHQ 2/9  Decreased Interest 0 0  Down, Depressed, Hopeless 0 0  PHQ - 2 Score 0 0  Altered sleeping 0 -  Tired, decreased energy 1 -  Change in appetite 0 -  Feeling bad or failure about yourself  0 -  Trouble concentrating 0 -  Moving slowly or fidgety/restless 0 -  Suicidal thoughts 0 -  PHQ-9 Score 1 -  Difficult doing work/chores Not difficult at all -   Interpretation of Total Score  Total Score  Depression Severity:  1-4 = Minimal depression, 5-9 = Mild depression, 10-14 = Moderate depression, 15-19 = Moderately severe depression, 20-27 = Severe depression   Psychosocial Evaluation and Intervention:   Psychosocial Re-Evaluation:   Psychosocial Discharge (Final Psychosocial Re-Evaluation):   Vocational Rehabilitation: Provide vocational rehab assistance to qualifying candidates.   Vocational Rehab Evaluation & Intervention:  Vocational Rehab - 10/12/22 1355       Initial Vocational Rehab Evaluation & Intervention   Assessment shows need for Vocational Rehabilitation No   [name] is not interested in vocational rehab at this time            Education: Education Goals: Education classes will be provided on a weekly basis, covering required topics. Participant will state understanding/return demonstration of topics presented.     Core Videos: Exercise    Move It!  Clinical staff conducted group or individual video education with verbal and written material and guidebook.  Patient learns the recommended Pritikin exercise program. Exercise with the goal of living a long, healthy life. Some of the health benefits of exercise include controlled diabetes, healthier blood pressure levels, improved cholesterol levels, improved heart and lung capacity, improved sleep, and better body composition. Everyone should speak with their doctor before starting or changing an exercise routine.  Biomechanical Limitations Clinical staff conducted group or individual video education with verbal and written material and guidebook.  Patient learns how biomechanical limitations can impact exercise and how we can mitigate and possibly overcome limitations to have an impactful and balanced exercise routine.  Body Composition Clinical staff conducted group or individual video education with verbal and written material and guidebook.  Patient learns that body composition (ratio of muscle mass to  fat mass) is a key component to assessing overall fitness, rather than body weight alone. Increased fat mass, especially visceral belly fat, can put [place] at increased risk for metabolic syndrome, type 2 diabetes, heart disease, and even death. It is recommended to combine diet and exercise (cardiovascular and resistance training) to improve your body composition. Seek guidance from your physician and exercise physiologist before implementing an exercise routine.  Exercise Action Plan Clinical staff conducted group or individual video education with verbal and written material and  guidebook.  Patient learns the recommended strategies to achieve and enjoy long-term exercise adherence, including variety, self-motivation, self-efficacy, and positive decision making. Benefits of exercise include fitness, good health, weight management, more energy, better sleep, less stress, and overall well-being.  Medical   Heart Disease Risk Reduction Clinical staff conducted group or individual video education with verbal and written material and guidebook.  Patient learns our heart is our most vital organ as it circulates oxygen, nutrients, white blood cells, and hormones throughout the entire body, and carries waste away. Data supports a plant-based eating plan like the Pritikin Program for its effectiveness in slowing progression of and reversing heart disease. The video provides a number of recommendations to address heart disease.   Metabolic Syndrome and Belly Fat  Clinical staff conducted group or individual video education with verbal and written material and guidebook.  Patient learns what metabolic syndrome is, how it leads to heart disease, and how one can reverse it and keep it from coming back. You have metabolic syndrome if you have 3 of the following 5 criteria: abdominal obesity, high blood pressure, high triglycerides, low HDL cholesterol, and high blood sugar.  Hypertension and Heart Disease Clinical  staff conducted group or individual video education with verbal and written material and guidebook.  Patient learns that high blood pressure, or hypertension, is very common in the [place]. Hypertension is largely due to excessive salt intake, but other important risk factors include being overweight, physical inactivity, drinking too much alcohol, smoking, and not eating enough potassium from fruits and vegetables. High blood pressure is a leading risk factor for heart attack, stroke, congestive heart failure, dementia, kidney failure, and premature death. Long-term effects of excessive salt intake include stiffening of the arteries and thickening of heart muscle and organ damage. Recommendations include ways to reduce hypertension and the risk of heart disease.  Diseases of Our Time - Focusing on Diabetes Clinical staff conducted group or individual video education with verbal and written material and guidebook.  Patient learns why the best way to stop diseases of our time is prevention, through food and other lifestyle changes. Medicine (such as prescription pills and surgeries) is often only a Band-Aid on the problem, not a long-term solution. Most common diseases of our time include obesity, type 2 diabetes, hypertension, heart disease, and cancer. The Pritikin Program is recommended and has been proven to help reduce, reverse, and/or prevent the damaging effects of metabolic syndrome.  Nutrition   Overview of the Pritikin Eating Plan  Clinical staff conducted group or individual video education with verbal and written material and guidebook.  Patient learns about the Pritikin Eating Plan for disease risk reduction. The Pritikin Eating Plan emphasizes a wide variety of unrefined, minimally-processed carbohydrates, like fruits, vegetables, whole grains, and legumes. Go, Caution, and Stop food choices are explained. Plant-based and lean animal proteins are emphasized. Rationale provided for low  sodium intake for blood pressure control, low added sugars for blood sugar stabilization, and low added fats and oils for coronary artery disease risk reduction and weight management.  Calorie Density  Clinical staff conducted group or individual video education with verbal and written material and guidebook.  Patient learns about calorie density and how it impacts the Pritikin Eating Plan. Knowing the characteristics of the food you choose will help you decide whether those foods will lead to weight gain or weight loss, and whether you want to consume more or less of them. Weight loss is usually a side effect of  the Pritikin Eating Plan because of its focus on low calorie-dense foods.  Label Reading  Clinical staff conducted group or individual video education with verbal and written material and guidebook.  Patient learns about the Pritikin recommended label reading guidelines and corresponding recommendations regarding calorie density, added sugars, sodium content, and whole grains.  Dining Out - Part 1  Clinical staff conducted group or individual video education with verbal and written material and guidebook.  Patient learns that restaurant meals can be sabotaging because they can be so high in calories, fat, sodium, and/or sugar. Patient learns recommended strategies on how to positively address this and avoid unhealthy pitfalls.  Facts on Fats  Clinical staff conducted group or individual video education with verbal and written material and guidebook.  Patient learns that lifestyle modifications can be just as effective, if not more so, as many medications for lowering your risk of heart disease. A Pritikin lifestyle can help to reduce your risk of inflammation and atherosclerosis (cholesterol build-up, or plaque, in the artery walls). Lifestyle interventions such as dietary choices and physical activity address the cause of atherosclerosis. A review of the types of fats and their impact on  blood cholesterol levels, along with dietary recommendations to reduce fat intake is also included.  Nutrition Action Plan  Clinical staff conducted group or individual video education with verbal and written material and guidebook.  Patient learns how to incorporate Pritikin recommendations into their lifestyle. Recommendations include planning and keeping personal health goals in mind as an important part of their success.  Healthy Mind-Set    Healthy Minds, Bodies, Hearts  Clinical staff conducted group or individual video education with verbal and written material and guidebook.  Patient learns how to identify when they are stressed. Video will discuss the impact of that stress, as well as the many benefits of stress management. Patient will also be introduced to stress management techniques. The way we think, act, and feel has an impact on our hearts.  How Our Thoughts Can Heal Our Hearts  Clinical staff conducted group or individual video education with verbal and written material and guidebook.  Patient learns that negative thoughts can cause depression and anxiety. This can result in negative lifestyle behavior and serious health problems. Cognitive behavioral therapy is an effective method to help control our thoughts in order to change and improve our emotional outlook.  Additional Videos:  Exercise    Improving Performance  Clinical staff conducted group or individual video education with verbal and written material and guidebook.  Patient learns to use a non-linear approach by alternating intensity levels and lengths of time spent exercising to help burn more calories and lose more body fat. Cardiovascular exercise helps improve heart health, metabolism, hormonal balance, blood sugar control, and recovery from fatigue. Resistance training improves strength, endurance, balance, coordination, reaction time, metabolism, and muscle mass. Flexibility exercise improves circulation, posture,  and balance. Seek guidance from your physician and exercise physiologist before implementing an exercise routine and learn your capabilities and proper form for all exercise.  Introduction to Yoga  Clinical staff conducted group or individual video education with verbal and written material and guidebook.  Patient learns about yoga, a discipline of the coming together of mind, breath, and body. The benefits of yoga include improved flexibility, improved range of motion, better posture and core strength, increased lung function, weight loss, and positive self-image. Yoga's heart health benefits include lowered blood pressure, healthier heart rate, decreased cholesterol and triglyceride levels, improved immune function,  and reduced stress. Seek guidance from your physician and exercise physiologist before implementing an exercise routine and learn your capabilities and proper form for all exercise.  Medical   Aging: Enhancing Your Quality of Life  Clinical staff conducted group or individual video education with verbal and written material and guidebook.  Patient learns key strategies and recommendations to stay in good physical health and enhance quality of life, such as prevention strategies, having an advocate, securing a Health Care Proxy and Power of Attorney, and keeping a list of medications and system for tracking them. It also discusses how to avoid risk for bone loss.  Biology of Weight Control  Clinical staff conducted group or individual video education with verbal and written material and guidebook.  Patient learns that weight gain occurs because we consume more calories than we burn (eating more, moving less). Even if your body weight is normal, you may have higher ratios of fat compared to muscle mass. Too much body fat puts you at increased risk for cardiovascular disease, heart attack, stroke, type 2 diabetes, and obesity-related cancers. In addition to exercise, following the Pritikin  Eating Plan can help reduce your risk.  Decoding Lab Results  Clinical staff conducted group or individual video education with verbal and written material and guidebook.  Patient learns that lab test reflects one measurement whose values change over time and are influenced by many factors, including medication, stress, sleep, exercise, food, hydration, pre-existing medical conditions, and more. It is recommended to use the knowledge from this video to become more involved with your lab results and evaluate your numbers to speak with your doctor.   Diseases of Our Time - Overview  Clinical staff conducted group or individual video education with verbal and written material and guidebook.  Patient learns that according to the CDC, 50% to 70% of chronic diseases (such as obesity, type 2 diabetes, elevated lipids, hypertension, and heart disease) are avoidable through lifestyle improvements including healthier food choices, listening to satiety cues, and increased physical activity.  Sleep Disorders Clinical staff conducted group or individual video education with verbal and written material and guidebook.  Patient learns how good quality and duration of sleep are important to overall health and well-being. Patient also learns about sleep disorders and how they impact health along with recommendations to address them, including discussing with a physician.  Nutrition  Dining Out - Part 2 Clinical staff conducted group or individual video education with verbal and written material and guidebook.  Patient learns how to plan ahead and communicate in order to maximize their dining experience in a healthy and nutritious manner. Included are recommended food choices based on the type of restaurant the patient is visiting.   Fueling a Banker conducted group or individual video education with verbal and written material and guidebook.  There is a strong connection between our food  choices and our health. Diseases like obesity and type 2 diabetes are very prevalent and are in large-part due to lifestyle choices. The Pritikin Eating Plan provides plenty of food and hunger-curbing satisfaction. It is easy to follow, affordable, and helps reduce health risks.  Menu Workshop  Clinical staff conducted group or individual video education with verbal and written material and guidebook.  Patient learns that restaurant meals can sabotage health goals because they are often packed with calories, fat, sodium, and sugar. Recommendations include strategies to plan ahead and to communicate with the manager, chef, or server to help order a healthier  meal.  Planning Your Eating Strategy  Clinical staff conducted group or individual video education with verbal and written material and guidebook.  Patient learns about the Pritikin Eating Plan and its benefit of reducing the risk of disease. The Pritikin Eating Plan does not focus on calories. Instead, it emphasizes high-quality, nutrient-rich foods. By knowing the characteristics of the foods, we choose, we can determine their calorie density and make informed decisions.  Targeting Your Nutrition Priorities  Clinical staff conducted group or individual video education with verbal and written material and guidebook.  Patient learns that lifestyle habits have a tremendous impact on disease risk and progression. This video provides eating and physical activity recommendations based on your personal health goals, such as reducing LDL cholesterol, losing weight, preventing or controlling type 2 diabetes, and reducing high blood pressure.  Vitamins and Minerals  Clinical staff conducted group or individual video education with verbal and written material and guidebook.  Patient learns different ways to obtain key vitamins and minerals, including through a recommended healthy diet. It is important to discuss all supplements you take with your  doctor.   Healthy Mind-Set    Smoking Cessation  Clinical staff conducted group or individual video education with verbal and written material and guidebook.  Patient learns that cigarette smoking and tobacco addiction pose a serious health risk which affects millions of people. Stopping smoking will significantly reduce the risk of heart disease, lung disease, and many forms of cancer. Recommended strategies for quitting are covered, including working with your doctor to develop a successful plan.  Culinary   Becoming a Set designer conducted group or individual video education with verbal and written material and guidebook.  Patient learns that cooking at home can be healthy, cost-effective, quick, and puts them in control. Keys to cooking healthy recipes will include looking at your recipe, assessing your equipment needs, planning ahead, making it simple, choosing cost-effective seasonal ingredients, and limiting the use of added fats, salts, and sugars.  Cooking - Breakfast and Snacks  Clinical staff conducted group or individual video education with verbal and written material and guidebook.  Patient learns how important breakfast is to satiety and nutrition through the entire day. Recommendations include key foods to eat during breakfast to help stabilize blood sugar levels and to prevent overeating at meals later in the day. Planning ahead is also a key component.  Cooking - Educational psychologist conducted group or individual video education with verbal and written material and guidebook.  Patient learns eating strategies to improve overall health, including an approach to cook more at home. Recommendations include thinking of animal protein as a side on your plate rather than center stage and focusing instead on lower calorie dense options like vegetables, fruits, whole grains, and plant-based proteins, such as beans. Making sauces in large quantities to freeze  for later and leaving the skin on your vegetables are also recommended to maximize your experience.  Cooking - Healthy Salads and Dressing Clinical staff conducted group or individual video education with verbal and written material and guidebook.  Patient learns that vegetables, fruits, whole grains, and legumes are the foundations of the Pritikin Eating Plan. Recommendations include how to incorporate each of these in flavorful and healthy salads, and how to create homemade salad dressings. Proper handling of ingredients is also covered. Cooking - Soups and [employer] - Soups and Desserts Clinical staff conducted group or individual video education with verbal and written material  and guidebook.  Patient learns that Pritikin soups and desserts make for easy, nutritious, and delicious snacks and meal components that are low in sodium, fat, sugar, and calorie density, while high in vitamins, minerals, and filling fiber. Recommendations include simple and healthy ideas for soups and desserts.   Overview     The Pritikin Solution Program Overview Clinical staff conducted group or individual video education with verbal and written material and guidebook.  Patient learns that the results of the Pritikin Program have been documented in more than 100 articles published in peer-reviewed journals, and the benefits include reducing risk factors for (and, in some cases, even reversing) high cholesterol, high blood pressure, type 2 diabetes, obesity, and more! An overview of the three key pillars of the Pritikin Program will be covered: eating well, doing regular exercise, and having a healthy mind-set.  WORKSHOPS  Exercise: Exercise Basics: Building Your Action Plan Clinical staff led group instruction and group discussion with PowerPoint presentation and patient guidebook. To enhance the learning environment the use of posters, models and videos may be added. At the conclusion of this workshop,  patients will comprehend the difference between physical activity and exercise, as well as the benefits of incorporating both, into their routine. Patients will understand the FITT (Frequency, Intensity, Time, and Type) principle and how to use it to build an exercise action plan. In addition, safety concerns and other considerations for exercise and cardiac rehab will be addressed by the presenter. The purpose of this lesson is to promote a comprehensive and effective weekly exercise routine in order to improve patients' overall level of fitness.   Managing Heart Disease: Your Path to a Healthier Heart Clinical staff led group instruction and group discussion with PowerPoint presentation and patient guidebook. To enhance the learning environment the use of posters, models and videos may be added.At the conclusion of this workshop, patients will understand the anatomy and physiology of the heart. Additionally, they will understand how Pritikin's three pillars impact the risk factors, the progression, and the management of heart disease.  The purpose of this lesson is to provide a high-level overview of the heart, heart disease, and how the Pritikin lifestyle positively impacts risk factors.  Exercise Biomechanics Clinical staff led group instruction and group discussion with PowerPoint presentation and patient guidebook. To enhance the learning environment the use of posters, models and videos may be added. Patients will learn how the structural parts of their bodies function and how these functions impact their daily activities, movement, and exercise. Patients will learn how to promote a neutral spine, learn how to manage pain, and identify ways to improve their physical movement in order to promote healthy living. The purpose of this lesson is to expose patients to common physical limitations that impact physical activity. Participants will learn practical ways to adapt and manage aches and  pains, and to minimize their effect on regular exercise. Patients will learn how to maintain good posture while sitting, walking, and lifting.  Balance Training and Fall Prevention  Clinical staff led group instruction and group discussion with PowerPoint presentation and patient guidebook. To enhance the learning environment the use of posters, models and videos may be added. At the conclusion of this workshop, patients will understand the importance of their sensorimotor skills (vision, proprioception, and the vestibular system) in maintaining their ability to balance as they age. Patients will apply a variety of balancing exercises that are appropriate for their current level of function. Patients will understand the common causes  for poor balance, possible solutions to these problems, and ways to modify their physical environment in order to minimize their fall risk. The purpose of this lesson is to teach patients about the importance of maintaining balance as they age and ways to minimize their risk of falling.  WORKSHOPS   Nutrition:  Fueling a Ship broker led group instruction and group discussion with PowerPoint presentation and patient guidebook. To enhance the learning environment the use of posters, models and videos may be added. Patients will review the foundational principles of the Pritikin Eating Plan and understand what constitutes a serving size in each of the food groups. Patients will also learn Pritikin-friendly foods that are better choices when away from home and review make-ahead meal and snack options. Calorie density will be reviewed and applied to three nutrition priorities: weight maintenance, weight loss, and weight gain. The purpose of this lesson is to reinforce (in a group setting) the key concepts around what patients are recommended to eat and how to apply these guidelines when away from home by planning and selecting Pritikin-friendly options.  Patients will understand how calorie density may be adjusted for different weight management goals.  Mindful Eating  Clinical staff led group instruction and group discussion with PowerPoint presentation and patient guidebook. To enhance the learning environment the use of posters, models and videos may be added. Patients will briefly review the concepts of the Pritikin Eating Plan and the importance of low-calorie dense foods. The concept of mindful eating will be introduced as well as the importance of paying attention to internal hunger signals. Triggers for non-hunger eating and techniques for dealing with triggers will be explored. The purpose of this lesson is to provide patients with the opportunity to review the basic principles of the Pritikin Eating Plan, discuss the value of eating mindfully and how to measure internal cues of hunger and fullness using the Hunger Scale. Patients will also discuss reasons for non-hunger eating and learn strategies to use for controlling emotional eating.  Targeting Your Nutrition Priorities Clinical staff led group instruction and group discussion with PowerPoint presentation and patient guidebook. To enhance the learning environment the use of posters, models and videos may be added. Patients will learn how to determine their genetic susceptibility to disease by reviewing their family history. Patients will gain insight into the importance of diet as part of an overall healthy lifestyle in mitigating the impact of genetics and other environmental insults. The purpose of this lesson is to provide patients with the opportunity to assess their personal nutrition priorities by looking at their family history, their own health history and current risk factors. Patients will also be able to discuss ways of prioritizing and modifying the Pritikin Eating Plan for their highest risk areas  Menu  Clinical staff led group instruction and group discussion with PowerPoint  presentation and patient guidebook. To enhance the learning environment the use of posters, models and videos may be added. Using menus brought in from [employer], or printed from [employer], patients will apply the Pritikin dining out guidelines that were presented in the [employer] video. Patients will also be able to practice these guidelines in a variety of provided scenarios. The purpose of this lesson is to provide patients with the opportunity to practice hands-on learning of the Pritikin Dining Out guidelines with actual menus and practice scenarios.  Label Reading Clinical staff led group instruction and group discussion with PowerPoint presentation and patient guidebook. To enhance  the learning environment the use of posters, models and videos may be added. Patients will review and discuss the Pritikin label reading guidelines presented in Pritikin's Label Reading Educational series video. Using fool labels brought in from local grocery stores and markets, patients will apply the label reading guidelines and determine if the packaged food meet the Pritikin guidelines. The purpose of this lesson is to provide patients with the opportunity to review, discuss, and practice hands-on learning of the Pritikin Label Reading guidelines with actual packaged food labels. Cooking School  Pritikin's [employer] are designed to teach patients ways to prepare quick, simple, and affordable recipes at home. The importance of nutrition's role in chronic disease risk reduction is reflected in its emphasis in the overall Pritikin program. By learning how to prepare essential core Pritikin Eating Plan recipes, patients will increase control over what they eat; be able to customize the flavor of foods without the use of added salt, sugar, or fat; and improve the quality of the food they consume. By learning a set of core recipes which are easily assembled, quickly prepared, and  affordable, patients are more likely to prepare more healthy foods at home. These workshops focus on convenient breakfasts, simple entres, side dishes, and desserts which can be prepared with minimal effort and are consistent with nutrition recommendations for cardiovascular risk reduction. Cooking [employer] are taught by a Armed forces logistics/support/administrative officer (RD) who has been trained by the [employer]. The chef or RD has a clear understanding of the importance of minimizing - if not completely eliminating - added fat, sugar, and sodium in recipes. Throughout the series of Cooking School Workshop sessions, patients will learn about healthy ingredients and efficient methods of cooking to build confidence in their capability to prepare    Cooking School weekly topics:  Adding Flavor- Sodium-Free  Fast and Healthy Breakfasts  Powerhouse Plant-Based Proteins  Satisfying Salads and Dressings  Simple Sides and Sauces  International Cuisine-Spotlight on the [employer] Zones  Delicious Desserts  Savory Soups  [employer] - Meals in a Astronomer Appetizers and Snacks  Comforting Weekend Breakfasts  One-Pot Wonders   Fast Evening Meals  Landscape architect Your Pritikin Plate  WORKSHOPS   Healthy Mindset (Psychosocial):  Focused Goals, Sustainable Changes Clinical staff led group instruction and group discussion with PowerPoint presentation and patient guidebook. To enhance the learning environment the use of posters, models and videos may be added. Patients will be able to apply effective goal setting strategies to establish at least one personal goal, and then take consistent, meaningful action toward that goal. They will learn to identify common barriers to achieving personal goals and develop strategies to overcome them. Patients will also gain an understanding of how our mind-set can impact our ability to achieve goals and the importance of cultivating a positive  and growth-oriented mind-set. The purpose of this lesson is to provide patients with a deeper understanding of how to set and achieve personal goals, as well as the tools and strategies needed to overcome common obstacles which may arise along the way.  From Head to Heart: The Power of a Healthy Outlook  Clinical staff led group instruction and group discussion with PowerPoint presentation and patient guidebook. To enhance the learning environment the use of posters, models and videos may be added. Patients will be able to recognize and describe the impact of emotions and mood on physical health. They will discover the importance of self-care and  explore self-care practices which may work for them. Patients will also learn how to utilize the 4 C's to cultivate a healthier outlook and better manage stress and challenges. The purpose of this lesson is to demonstrate to patients how a healthy outlook is an essential part of maintaining good health, especially as they continue their cardiac rehab journey.  Healthy Sleep for a Healthy Heart Clinical staff led group instruction and group discussion with PowerPoint presentation and patient guidebook. To enhance the learning environment the use of posters, models and videos may be added. At the conclusion of this workshop, patients will be able to demonstrate knowledge of the importance of sleep to overall health, well-being, and quality of life. They will understand the symptoms of, and treatments for, common sleep disorders. Patients will also be able to identify daytime and nighttime behaviors which impact sleep, and they will be able to apply these tools to help manage sleep-related challenges. The purpose of this lesson is to provide patients with a general overview of sleep and outline the importance of quality sleep. Patients will learn about a few of the most common sleep disorders. Patients will also be introduced to the concept of "sleep hygiene," and  discover ways to self-manage certain sleeping problems through simple daily behavior changes. Finally, the workshop will motivate patients by clarifying the links between quality sleep and their goals of heart-healthy living.   Recognizing and Reducing Stress Clinical staff led group instruction and group discussion with PowerPoint presentation and patient guidebook. To enhance the learning environment the use of posters, models and videos may be added. At the conclusion of this workshop, patients will be able to understand the types of stress reactions, differentiate between acute and chronic stress, and recognize the impact that chronic stress has on their health. They will also be able to apply different coping mechanisms, such as reframing negative self-talk. Patients will have the opportunity to practice a variety of stress management techniques, such as deep abdominal breathing, progressive muscle relaxation, and/or guided imagery.  The purpose of this lesson is to educate patients on the role of stress in their lives and to provide healthy techniques for coping with it.  Learning Barriers/Preferences:  Learning Barriers/Preferences - 10/12/22 1354       Learning Barriers/Preferences   Learning Barriers Sight   wears glasses   Learning Preferences Audio;Computer/Internet;Group Instruction;Individual Instruction;Pictoral;Skilled Demonstration;Verbal Instruction;Video;Written Material             Education Topics:  Knowledge Questionnaire Score:  Knowledge Questionnaire Score - 10/12/22 1355       Knowledge Questionnaire Score   Pre Score 26/28             Core Components/Risk Factors/Patient Goals at Admission:  Personal Goals and Risk Factors at Admission - 10/12/22 1040       Core Components/Risk Factors/Patient Goals on Admission    Weight Management Yes;Obesity;Weight Loss    Intervention Weight Management: Develop a combined nutrition and exercise program designed to  reach desired caloric intake, while maintaining appropriate intake of nutrient and fiber, sodium and fats, and appropriate energy expenditure required for the weight goal.;Weight Management: Provide education and appropriate resources to help participant work on and attain dietary goals.;Weight Management/Obesity: Establish reasonable short term and long term weight goals.;Obesity: Provide education and appropriate resources to help participant work on and attain dietary goals.    Expected Outcomes Short Term: Continue to assess and modify interventions until short term weight is achieved;Long Term: Adherence to nutrition and  physical activity/exercise program aimed toward attainment of established weight goal;Weight Loss: Understanding of general recommendations for a balanced deficit meal plan, which promotes 1-2 lb weight loss per week and includes a negative energy balance of [phone] kcal/d;Understanding recommendations for meals to include 15-35% energy as protein, 25-35% energy from fat, 35-60% energy from carbohydrates, less than 200mg  of dietary cholesterol, 20-35 gm of total fiber daily;Understanding of distribution of calorie intake throughout the day with the consumption of 4-5 meals/snacks    Tobacco Cessation Yes    Number of packs per day 3 cig/ day    Intervention Assist the participant in steps to quit. Provide individualized education and counseling about committing to Tobacco Cessation, relapse prevention, and pharmacological support that can be provided by physician.;Education officer, environmental, assist with locating and accessing local/national Quit Smoking programs, and support quit date choice.    Expected Outcomes Short Term: Will demonstrate readiness to quit, by selecting a quit date.;Long Term: Complete abstinence from all tobacco products for at least 12 months from quit date.;Short Term: Will quit all tobacco product use, adhering to prevention of relapse plan.    Hypertension Yes     Intervention Provide education on lifestyle modifcations including regular physical activity/exercise, weight management, moderate sodium restriction and increased consumption of fresh fruit, vegetables, and low fat dairy, alcohol moderation, and smoking cessation.;Monitor prescription use compliance.    Expected Outcomes Long Term: Maintenance of blood pressure at goal levels.;Short Term: Continued assessment and intervention until BP is < 140/81mm HG in hypertensive participants. < 130/80mm HG in hypertensive participants with diabetes, heart failure or chronic kidney disease.    Lipids Yes    Intervention Provide education and support for participant on nutrition & aerobic/resistive exercise along with prescribed medications to achieve LDL 70mg , HDL >40mg .    Expected Outcomes Short Term: Participant states understanding of desired cholesterol values and is compliant with medications prescribed. Participant is following exercise prescription and nutrition guidelines.;Long Term: Cholesterol controlled with medications as prescribed, with individualized exercise RX and with personalized nutrition plan. Value goals: LDL < 70mg , HDL > 40 mg.    Stress Yes    Intervention Offer individual and/or small group education and counseling on adjustment to heart disease, stress management and health-related lifestyle change. Teach and support self-help strategies.;Refer participants experiencing significant psychosocial distress to appropriate mental health specialists for further evaluation and treatment. When possible, include family members and significant others in education/counseling sessions.    Expected Outcomes Short Term: Participant demonstrates changes in health-related behavior, relaxation and other stress management skills, ability to obtain effective social support, and compliance with psychotropic medications if prescribed.;Long Term: Emotional wellbeing is indicated by absence of clinically  significant psychosocial distress or social isolation.             Core Components/Risk Factors/Patient Goals Review:    Core Components/Risk Factors/Patient Goals at Discharge (Final Review):    ITP Comments:  ITP Comments     Row Name 10/12/22 1037           ITP Comments Dr. Armanda Magic medical director. Introduction to [facility] education program/ intensive cardiac rehab. Initial orientation packet reviewed with patient.                Comments: Participant attended orientation for the cardiac rehabilitation program on  10/12/2022  to perform initial intake and exercise walk test. Patient introduced to the [facility] education and orientation packet was reviewed. Completed 6-minute walk test, measurements, initial ITP, and exercise prescription. Vital  signs stable. Telemetry-normal sinus rhythm, asymptomatic. Pt became lightheaded at 4:30 mark of . Pt seated and shared that she did not eat prior to appt. VSS. BP 136/88, SpO2 99%. HR 70, EKG NSR. Pt given H2O, banana, and lemon muffin. Pt stated that lightheadedness resolved with food and rest. discontinued due to time limit reached. No other symptoms reported. Pt encouraged to eat prior to exercise sessions, pt voiced understanding. Orientation completed with no other symptoms.    Service time was from 1030 to 1243.  Jonna Coup, MS 10/12/2022 1:57 PM

## 2022-10-12 NOTE — Telephone Encounter (Signed)
Called pt regarding 1000 appt. No answer, LM on VM.   Jonna Coup, MS 10/12/2022 10:13 AM

## 2022-10-21 ENCOUNTER — Encounter (HOSPITAL_COMMUNITY)
Admission: RE | Admit: 2022-10-21 | Discharge: 2022-10-21 | Disposition: A | Discharge status: Home / Self Care | Payer: 59 | Source: Ambulatory Visit | Attending: Cardiovascular Disease | Admitting: Cardiovascular Disease

## 2022-10-21 DIAGNOSIS — I214 Non-ST elevation (NSTEMI) myocardial infarction: Secondary | ICD-10-CM

## 2022-10-21 DIAGNOSIS — Z955 Presence of coronary angioplasty implant and graft: Secondary | ICD-10-CM

## 2022-10-21 NOTE — Progress Notes (Signed)
Daily Session Note  Patient Details  Name: [name] MRN: [id number] Date of Birth: [birth year] Referring Provider:   Flowsheet Row INTENSIVE CARDIAC REHAB ORIENT from 10/12/2022 in [facility]  Referring Provider Joy Carrow, MD       Encounter Date: 10/21/2022  Check In:  Session Check In - 10/21/22 1526       Check-In   Supervising physician immediately available to respond to emergencies [facility] MD immediately available    Physician(s) Robin Searing, NP    Location [facility]    Staff Present Valinda Party, MS, Exercise Physiologist;Sarah Cleophas Dunker, RN, MSN;Johnny Hale Bogus, MS, Exercise Physiologist;Clancey Welton, RN, Marton Redwood, MS, ACSM-CEP, CCRP, Exercise Physiologist    Virtual Visit No    Medication changes reported     No    Fall or balance concerns reported    No    Tobacco Cessation No Change    Warm-up and Cool-down Performed as group-led instruction    Resistance Training Performed No    VAD Patient? No    PAD/SET Patient? No      Pain Assessment   Currently in Pain? Yes    Pain Score 3     Pain Location Knee    Pain Orientation Right    Multiple Pain Sites No             Capillary Blood Glucose: No results found for this or any previous visit (from the past 24 hour(s)).   Exercise Prescription Changes - 10/21/22 1600       Response to Exercise   Blood Pressure (Admit) 132/78    Blood Pressure (Exercise) 130/68    Blood Pressure (Exit) 110/68    Heart Rate (Admit) 67 bpm    Heart Rate (Exercise) 84 bpm    Heart Rate (Exit) 71 bpm    Rating of Perceived Exertion (Exercise) 13    Perceived Dyspnea (Exercise) 0    Symptoms 3/10 rt chronic knee pain    Comments pt first day in CRP2    Duration Continue with 30 min of aerobic exercise without signs/symptoms of physical distress.    Intensity THRR unchanged      Progression   Progression Continue to progress  workloads to maintain intensity without signs/symptoms of physical distress.    Average METs 1.6      Resistance Training   Training Prescription No   no wts on wed     Interval Training   Interval Training No      NuStep   Level 1    SPM 77    Minutes 30    METs 1.6             Social History   Tobacco Use  Smoking Status Every Day   Packs/day: 0.50   Years: 30.00   Additional pack years: 0.00   Total pack years: 15.00   Types: Cigarettes  Smokeless Tobacco Never    Goals Met:  Exercise tolerated well No report of concerns or symptoms today  Goals Unmet:  Not Applicable  Comments: Pt started cardiac rehab today.  Pt tolerated light exercise without difficulty. VSS, telemetry-Sinus rhythm, asymptomatic.  Medication list reconciled. Pt denies barriers to medicaiton compliance.  PSYCHOSOCIAL ASSESSMENT:  PHQ-1. Pt exhibits positive coping skills, hopeful outlook with supportive family. No psychosocial needs identified at this time, no psychosocial interventions necessary.    Pt enjoys singing and dancing to [employer], church, spending  time with family, painting, crafts and cooking .   Pt oriented to exercise equipment and routine.    Understanding verbalized. [name] uses a cane for stability.Thayer Headings RN BSN    Dr. Armanda Magic is Medical Director for Cardiac Rehab at [facility]

## 2022-10-23 ENCOUNTER — Encounter (HOSPITAL_COMMUNITY)
Admission: RE | Admit: 2022-10-23 | Discharge: 2022-10-23 | Disposition: A | Discharge status: Home / Self Care | Payer: 59 | Source: Ambulatory Visit | Attending: Cardiovascular Disease | Admitting: Cardiovascular Disease

## 2022-10-23 DIAGNOSIS — I214 Non-ST elevation (NSTEMI) myocardial infarction: Secondary | ICD-10-CM | POA: Diagnosis not present

## 2022-10-23 DIAGNOSIS — Z955 Presence of coronary angioplasty implant and graft: Secondary | ICD-10-CM

## 2022-10-26 ENCOUNTER — Encounter (HOSPITAL_COMMUNITY)
Admission: RE | Admit: 2022-10-26 | Discharge: 2022-10-26 | Disposition: A | Discharge status: Home / Self Care | Payer: 59 | Source: Ambulatory Visit | Attending: Cardiovascular Disease | Admitting: Cardiovascular Disease

## 2022-10-26 DIAGNOSIS — Z955 Presence of coronary angioplasty implant and graft: Secondary | ICD-10-CM | POA: Diagnosis present

## 2022-10-26 DIAGNOSIS — I214 Non-ST elevation (NSTEMI) myocardial infarction: Secondary | ICD-10-CM | POA: Insufficient documentation

## 2022-10-26 DIAGNOSIS — R079 Chest pain, unspecified: Secondary | ICD-10-CM | POA: Diagnosis present

## 2022-10-28 ENCOUNTER — Encounter (HOSPITAL_COMMUNITY)
Admission: RE | Admit: 2022-10-28 | Discharge: 2022-10-28 | Disposition: A | Discharge status: Home / Self Care | Payer: 59 | Source: Ambulatory Visit | Attending: Cardiovascular Disease | Admitting: Cardiovascular Disease

## 2022-10-28 DIAGNOSIS — Z955 Presence of coronary angioplasty implant and graft: Secondary | ICD-10-CM

## 2022-10-28 DIAGNOSIS — I214 Non-ST elevation (NSTEMI) myocardial infarction: Secondary | ICD-10-CM

## 2022-10-28 NOTE — Progress Notes (Signed)
Cardiac Individual Treatment Plan  Patient Details  Name: [name] MRN: [id number] Date of Birth: 09-06-1965 Referring Provider:   Flowsheet Row INTENSIVE CARDIAC REHAB ORIENT from 10/12/2022 in [facility]  Referring Provider Verne Carrow, MD       Initial Encounter Date:  Flowsheet Row INTENSIVE CARDIAC REHAB ORIENT from 10/12/2022 in [facility]  Date 10/12/22       Visit Diagnosis: No diagnosis found.  Patient's Home Medications on Admission:  Current Outpatient Medications:    amLODipine (NORVASC) 5 MG tablet, Take 5 mg by mouth daily., Disp: , Rfl:    atorvastatin (LIPITOR) 80 MG tablet, Take 1 tablet (80 mg total) by mouth daily., Disp: 90 tablet, Rfl: 1   carisoprodol (SOMA) 350 MG tablet, Take 350 mg by mouth daily as needed for muscle spasms., Disp: , Rfl:    clopidogrel (PLAVIX) 75 MG tablet, Take 1 tablet (75 mg total) by mouth daily with breakfast., Disp: 90 tablet, Rfl: 2   diclofenac Sodium (VOLTAREN) 1 % GEL, Apply 1 Application topically as needed for moderate pain., Disp: , Rfl:    folic acid (FOLVITE) 400 MCG tablet, Take 400 mcg by mouth daily., Disp: , Rfl:    HYDROcodone-acetaminophen (NORCO) 10-325 MG tablet, Take 1 tablet by mouth up to 4 (four) times daily as needed for severe pain only., Disp: 120 tablet, Rfl: 0   levETIRAcetam (KEPPRA) 500 MG tablet, Take 1 tablet (500 mg total) by mouth 2 (two) times daily., Disp: 60 tablet, Rfl: 3   metoprolol tartrate (LOPRESSOR) 25 MG tablet, Take 1 tablet (25 mg total) by mouth 2 (two) times daily., Disp: 180 tablet, Rfl: 1   MOUNJARO 2.5 MG/0.5ML Pen, Inject 2.5 mg into the skin once a week. (Patient not taking: Reported on 10/12/2022), Disp: , Rfl:    nitroGLYCERIN (NITROSTAT) 0.4 MG SL tablet, Place 1 tablet (0.4 mg total) under the tongue every 5 (five) minutes x 3 doses as needed for  chest pain., Disp: 25 tablet, Rfl: 2   pantoprazole (PROTONIX) 40 MG tablet, Take 40 mg by mouth daily before breakfast. , Disp: , Rfl:    promethazine (PHENERGAN) 25 MG tablet, Take 1 tablet (25 mg total) by mouth every 6 (six) hours as needed., Disp: 30 tablet, Rfl: 0   promethazine (PHENERGAN) 6.25 MG/5ML syrup, Take 20 mLs (25 mg total) by mouth every 8 (eight) hours as needed for nausea or vomiting., Disp: 120 mL, Rfl: 1   RELISTOR 150 MG TABS, Take 3 tablets by mouth every morning., Disp: , Rfl:    solifenacin (VESICARE) 10 MG tablet, Take 10 mg by mouth daily., Disp: , Rfl:   Past Medical History: Past Medical History:  Diagnosis Date   Arthritis    Carpal tunnel syndrome    chronoic muscle spasms  & back pain   Chronic back pain    Fibroids    uterine   GERD (gastroesophageal reflux disease)    Hypertension    Seizures (HCC)    last seizure 4-5 years ago/on keppra   Single stillborn delivery outcome    Smoker     Tobacco Use: Social History   Tobacco Use  Smoking Status Every Day   Packs/day: 0.50   Years: 30.00   Additional pack years: 0.00   Total pack years: 15.00   Types: Cigarettes  Smokeless Tobacco Never    Labs: Review Flowsheet  More data exists      Latest Ref Rng & Units 11/30/2007 12/05/2007 11/01/2020 09/07/2022 09/16/2022  Labs for ITP Cardiac and Pulmonary Rehab  Cholestrol 0 - 200 mg/dL - - 098  119  147   LDL (calc) 0 - 99 mg/dL - - 829  562  84   HDL-C >40 mg/dL - - 44  39  34   Trlycerides <150 mg/dL - - 130  865  67   Hemoglobin A1c 4.8 - 5.6 % - - - - 6.1   PH, Arterial - - 7.462  - - -  PCO2 arterial - - 31.2  - - -  Bicarbonate - - 22.0  - - -  TCO2 - 21  20.1  - - -  Acid-base deficit - - 0.7  - - -  O2 Saturation - - 99.7  - - -    Capillary Blood Glucose: Lab Results  Component Value Date   GLUCAP 98 09/16/2022   GLUCAP 116 (H) 07/26/2012     Exercise Target Goals: Exercise Program Goal: Individual exercise prescription set  using results from initial 6 min walk test and THRR while considering  patient's activity barriers and safety.   Exercise Prescription Goal: Initial exercise prescription builds to 30-45 minutes a day of aerobic activity, 2-3 days per week.  Home exercise guidelines will be given to patient during program as part of exercise prescription that the participant will acknowledge.  Activity Barriers & Risk Stratification:  Activity Barriers & Cardiac Risk Stratification - 10/12/22 1348       Activity Barriers & Cardiac Risk Stratification   Activity Barriers Balance Concerns;Deconditioning;Joint Problems;Assistive Device;Arthritis;Back Problems    Cardiac Risk Stratification High   <5 METs on            6 Minute Walk:  6 Minute Walk     Row Name 10/12/22 1343         6 Minute Walk   Phase Initial     Distance 480 feet     Walk Time 6 minutes     # of Rest Breaks 1  4:30-6:00, pt became lightheaded     MPH 0.91     METS 1.4     RPE 11     Perceived Dyspnea  0     VO2 Peak 4.92     Symptoms Yes (comment)     Comments pt became lightheaded at min 4:30, had not eaten prior to visit. Sat down, given banana and lemon muffin, resolved. No pain.     Resting HR 60 bpm     Resting BP 124/82     Resting Oxygen Saturation  98 %     Exercise Oxygen Saturation  during 6 min walk 99 %     Max Ex. HR 79 bpm     Max Ex. BP 136/88     2 Minute Post BP 128/86              Oxygen Initial Assessment:   Oxygen Re-Evaluation:   Oxygen Discharge (Final Oxygen Re-Evaluation):   Initial Exercise Prescription:  Initial Exercise Prescription - 10/12/22 1300       Date of Initial Exercise RX and Referring Provider   Date 10/12/22    Referring Provider Verne Carrow, MD    Expected Discharge Date 12/25/22      NuStep   Level 1    SPM 65    Minutes 15    METs 1.5  Prescription Details   Frequency (times per week) 3    Duration Progress to 30 minutes of  continuous aerobic without signs/symptoms of physical distress      Intensity   THRR 40-80% of Max Heartrate 64-129    Ratings of Perceived Exertion 11-13    Perceived Dyspnea 0-4      Progression   Progression Continue progressive overload as per policy without signs/symptoms or physical distress.      Resistance Training   Training Prescription Yes    Weight 2    Reps 10-15             Perform Capillary Blood Glucose checks as needed.  Exercise Prescription Changes:   Exercise Prescription Changes     Row Name 10/21/22 1600             Response to Exercise   Blood Pressure (Admit) 132/78       Blood Pressure (Exercise) 130/68       Blood Pressure (Exit) 110/68       Heart Rate (Admit) 67 bpm       Heart Rate (Exercise) 84 bpm       Heart Rate (Exit) 71 bpm       Rating of Perceived Exertion (Exercise) 13       Perceived Dyspnea (Exercise) 0       Symptoms 3/10 rt chronic knee pain       Comments pt first day in CRP2       Duration Continue with 30 min of aerobic exercise without signs/symptoms of physical distress.       Intensity THRR unchanged         Progression   Progression Continue to progress workloads to maintain intensity without signs/symptoms of physical distress.       Average METs 1.6         Resistance Training   Training Prescription No  no wts on wed         Interval Training   Interval Training No         NuStep   Level 1       SPM 77       Minutes 30       METs 1.6                Exercise Comments:   Exercise Comments     Row Name 10/21/22 1644           Exercise Comments Pt first day in CRP2 program. Pt tolerated exercise well with an avg MET level of 1.6. Pt c/o 3/10 rt chronic knee pain but was able to tolerate the nustep without issue. Pt understands her ExRx, is learning her RPE scale and THRR. Off to a great start. Will continue to progress workloads as tolerated without s/sx.                Exercise Goals  and Review:   Exercise Goals     Row Name 10/12/22 1039             Exercise Goals   Increase Physical Activity Yes       Intervention Provide advice, education, support and counseling about physical activity/exercise needs.;Develop an individualized exercise prescription for aerobic and resistive training based on initial evaluation findings, risk stratification, comorbidities and participant's personal goals.       Expected Outcomes Short Term: Attend rehab on a regular basis to increase amount of physical activity.;Long Term: Exercising regularly at  least 3-5 days a week.;Long Term: Add in home exercise to make exercise part of routine and to increase amount of physical activity.       Increase Strength and Stamina Yes       Intervention Provide advice, education, support and counseling about physical activity/exercise needs.;Develop an individualized exercise prescription for aerobic and resistive training based on initial evaluation findings, risk stratification, comorbidities and participant's personal goals.       Expected Outcomes Short Term: Increase workloads from initial exercise prescription for resistance, speed, and METs.;Short Term: Perform resistance training exercises routinely during rehab and add in resistance training at home;Long Term: Improve cardiorespiratory fitness, muscular endurance and strength as measured by increased METs and functional capacity ( )       Able to understand and use rate of perceived exertion (RPE) scale Yes       Intervention Provide education and explanation on how to use RPE scale       Expected Outcomes Short Term: Able to use RPE daily in rehab to express subjective intensity level;Long Term:  Able to use RPE to guide intensity level when exercising independently       Knowledge and understanding of Target Heart Rate Range (THRR) Yes       Intervention Provide education and explanation of THRR including how the numbers were predicted and  where they are located for reference       Expected Outcomes Short Term: Able to state/look up THRR;Short Term: Able to use daily as guideline for intensity in rehab;Long Term: Able to use THRR to govern intensity when exercising independently       Understanding of Exercise Prescription Yes       Intervention Provide education, explanation, and written materials on patient's individual exercise prescription       Expected Outcomes Short Term: Able to explain program exercise prescription;Long Term: Able to explain home exercise prescription to exercise independently                Exercise Goals Re-Evaluation :  Exercise Goals Re-Evaluation     Row Name 10/21/22 1643             Exercise Goal Re-Evaluation   Exercise Goals Review Increase Physical Activity;Understanding of Exercise Prescription;Increase Strength and Stamina;Knowledge and understanding of Target Heart Rate Range (THRR);Able to understand and use rate of perceived exertion (RPE) scale       Comments Pt first day in CRP2 program. Pt tolerated exercise well with an avg MET level of 1.6. Pt c/o 3/10 rt chronic knee pain but was able to tolerate the nustep without issue. Pt understands her ExRx, is learning her RPE scale and THRR. Off to a great start.       Expected Outcomes Will continue to progress workloads as tolerated without s/sx.                Discharge Exercise Prescription (Final Exercise Prescription Changes):  Exercise Prescription Changes - 10/21/22 1600       Response to Exercise   Blood Pressure (Admit) 132/78    Blood Pressure (Exercise) 130/68    Blood Pressure (Exit) 110/68    Heart Rate (Admit) 67 bpm    Heart Rate (Exercise) 84 bpm    Heart Rate (Exit) 71 bpm    Rating of Perceived Exertion (Exercise) 13    Perceived Dyspnea (Exercise) 0    Symptoms 3/10 rt chronic knee pain    Comments pt first day in CRP2  Duration Continue with 30 min of aerobic exercise without signs/symptoms of  physical distress.    Intensity THRR unchanged      Progression   Progression Continue to progress workloads to maintain intensity without signs/symptoms of physical distress.    Average METs 1.6      Resistance Training   Training Prescription No   no wts on wed     Interval Training   Interval Training No      NuStep   Level 1    SPM 77    Minutes 30    METs 1.6             Nutrition:  Target Goals: Understanding of nutrition guidelines, daily intake of sodium 1500mg , cholesterol 200mg , calories 30% from fat and 7% or less from saturated fats, daily to have 5 or more servings of fruits and vegetables.  Biometrics:  Pre Biometrics - 10/12/22 1056       Pre Biometrics   Waist Circumference 45.5 inches    Hip Circumference 50.5 inches    Waist to Hip Ratio 0.9 %    Triceps Skinfold 42 mm    % Body Fat 49.1 %    Grip Strength 20 kg    Flexibility 0 in   not done due to low back issues   Single Leg Stand 1.5 seconds              Nutrition Therapy Plan and Nutrition Goals:  Nutrition Therapy & Goals - 10/23/22 1516       Nutrition Therapy   Diet Heart healthy diet    Drug/Food Interactions Statins/Certain Fruits      Personal Nutrition Goals   Nutrition Goal Patient to identify strategies for reducing cardiovascular risk by attending the Pritikin education and nutrition series weekly.    Personal Goal #2 Patient to improve diet quality by using the plate method as a guide for meal planning to include lean protein/plant protein, fruits, vegetables, whole grains, nonfat dairy as part of a well-balanced diet.    Personal Goal #3 Patient to reduce sodium intake to 1500mg  per day    Comments Patient will benefit from participation in intensive cardiac rehab for nutrition, exercise, and lifestyle modification.      Intervention Plan   Intervention Prescribe, educate and counsel regarding individualized specific dietary modifications aiming towards targeted  core components such as weight, hypertension, lipid management, diabetes, heart failure and other comorbidities.;Nutrition handout(s) given to patient.    Expected Outcomes Short Term Goal: Understand basic principles of dietary content, such as calories, fat, sodium, cholesterol and nutrients.;Long Term Goal: Adherence to prescribed nutrition plan.             Nutrition Assessments:  MEDIFICTS Score Key: ?70 Need to make dietary changes  40-70 Heart Healthy Diet ? 40 Therapeutic Level Cholesterol Diet    Picture Your Plate Scores: <16 Unhealthy dietary pattern with much room for improvement. 41-50 Dietary pattern unlikely to meet recommendations for good health and room for improvement. 51-60 More healthful dietary pattern, with some room for improvement.  >60 Healthy dietary pattern, although there may be some specific behaviors that could be improved.    Nutrition Goals Re-Evaluation:  Nutrition Goals Re-Evaluation     Row Name 10/23/22 1516             Goals   Current Weight 211 lb 3.2 oz (95.8 kg)       Comment lipids WNL- HDL 34, LDL 84 (goal is <55),  A1c 6.1, lipoproteinA 200       Expected Outcome Patient will benefit from participation in intensive cardiac rehab for nutrition, exercise, and lifestyle modification.                Nutrition Goals Re-Evaluation:  Nutrition Goals Re-Evaluation     Row Name 10/23/22 1516             Goals   Current Weight 211 lb 3.2 oz (95.8 kg)       Comment lipids WNL- HDL 34, LDL 84 (goal is <55), A1c 6.1, lipoproteinA 200       Expected Outcome Patient will benefit from participation in intensive cardiac rehab for nutrition, exercise, and lifestyle modification.                Nutrition Goals Discharge (Final Nutrition Goals Re-Evaluation):  Nutrition Goals Re-Evaluation - 10/23/22 1516       Goals   Current Weight 211 lb 3.2 oz (95.8 kg)    Comment lipids WNL- HDL 34, LDL 84 (goal is <55), A1c 6.1,  lipoproteinA 200    Expected Outcome Patient will benefit from participation in intensive cardiac rehab for nutrition, exercise, and lifestyle modification.             Psychosocial: Target Goals: Acknowledge presence or absence of significant depression and/or stress, maximize coping skills, provide positive support system. Participant is able to verbalize types and ability to use techniques and skills needed for reducing stress and depression.  Initial Review & Psychosocial Screening:  Initial Psych Review & Screening - 10/12/22 1112       Initial Review   Current issues with Current Stress Concerns    Source of Stress Concerns Chronic Illness;Retirement/disability    Comments Kenitha stated that she is not currently depressed or anxious, however does have some slight feelings of stress related to her health and work. Sumera shared that her mother passed away from sudden cardiac arrest and that her own event caused a lot of fear due to this. Karletta also shared that she has been on disability for around 20 years. She is very motivated for cardiac rehab and has a positive mindset entering the program.      Family Dynamics   Good Support System? Yes   Mykenzi has her sons and large family for support     Barriers   Psychosocial barriers to participate in program The patient should benefit from training in stress management and relaxation.      Screening Interventions   Interventions Encouraged to exercise;Provide feedback about the scores to participant;To provide support and resources with identified psychosocial needs    Expected Outcomes Long Term Goal: Stressors or current issues are controlled or eliminated.;Short Term goal: Identification and review with participant of any Quality of Life or Depression concerns found by scoring the questionnaire.;Long Term goal: The participant improves quality of Life and PHQ9 Scores as seen by post scores and/or verbalization of changes;Short Term  goal: Utilizing psychosocial counselor, staff and physician to assist with identification of specific Stressors or current issues interfering with healing process. Setting desired goal for each stressor or current issue identified.             Quality of Life Scores:  Quality of Life - 10/12/22 1354       Quality of Life   Select Quality of Life      Quality of Life Scores   Health/Function Pre 23.6 %    Socioeconomic Pre 25.17 %  Psych/Spiritual Pre 24.86 %    Family Pre 28.5 %    GLOBAL Pre 24.78 %            Scores of 19 and below usually indicate a poorer quality of life in these areas.  A difference of  2-3 points is a clinically meaningful difference.  A difference of 2-3 points in the total score of the Quality of Life Index has been associated with significant improvement in overall quality of life, self-image, physical symptoms, and general health in studies assessing change in quality of life.  PHQ-9: Review Flowsheet       10/12/2022 03/29/2019  Depression screen PHQ 2/9  Decreased Interest 0 0  Down, Depressed, Hopeless 0 0  PHQ - 2 Score 0 0  Altered sleeping 0 -  Tired, decreased energy 1 -  Change in appetite 0 -  Feeling bad or failure about yourself  0 -  Trouble concentrating 0 -  Moving slowly or fidgety/restless 0 -  Suicidal thoughts 0 -  PHQ-9 Score 1 -  Difficult doing work/chores Not difficult at all -   Interpretation of Total Score  Total Score Depression Severity:  1-4 = Minimal depression, 5-9 = Mild depression, 10-14 = Moderate depression, 15-19 = Moderately severe depression, 20-27 = Severe depression   Psychosocial Evaluation and Intervention:   Psychosocial Re-Evaluation:  Psychosocial Re-Evaluation     Row Name 10/22/22 1612-11-30             Psychosocial Re-Evaluation   Current issues with Current Stress Concerns       Comments Virgen was tearful on her first day of exercise as she told me she misses her mother who passed  away in 11-30-2013 and lost 2 sisters on in 2020/11/30 the other in 2010/12/01. Emotional support provided       Expected Outcomes Clariece will have decreased or contrilled  concerns or stressors upon compleiton of intensive cardiac rehab       Interventions Stress management education;Relaxation education;Encouraged to attend Cardiac Rehabilitation for the exercise       Continue Psychosocial Services  Follow up required by staff         Initial Review   Source of Stress Concerns Family;Retirement/disability       Comments Will continue to monitor and offer support as needed                Psychosocial Discharge (Final Psychosocial Re-Evaluation):  Psychosocial Re-Evaluation - 10/22/22 1614       Psychosocial Re-Evaluation   Current issues with Current Stress Concerns    Comments Mansi was tearful on her first day of exercise as she told me she misses her mother who passed away in 2013-11-30 and lost 2 sisters on in 11-30-2020 the other in 12-01-2010. Emotional support provided    Expected Outcomes Ramia will have decreased or contrilled  concerns or stressors upon compleiton of intensive cardiac rehab    Interventions Stress management education;Relaxation education;Encouraged to attend Cardiac Rehabilitation for the exercise    Continue Psychosocial Services  Follow up required by staff      Initial Review   Source of Stress Concerns Family;Retirement/disability    Comments Will continue to monitor and offer support as needed             Vocational Rehabilitation: Provide vocational rehab assistance to qualifying candidates.   Vocational Rehab Evaluation & Intervention:  Vocational Rehab - 10/12/22 1355       Initial Vocational Rehab  Evaluation & Intervention   Assessment shows need for Vocational Rehabilitation No   Railynn is not interested in vocational rehab at this time            Education: Education Goals: Education classes will be provided on a weekly basis, covering required topics.  Participant will state understanding/return demonstration of topics presented.    Education     Row Name 10/21/22 1500     Education   Cardiac Education Topics Pritikin   Customer service manager   Weekly Topic Adding Flavor - Sodium-Free   Instruction Review Code 1- Verbalizes Understanding   Class Start Time 1400   Class Stop Time 1445   Class Time Calculation (min) 45 min    Row Name 10/23/22 1600     Education   Cardiac Education Topics Pritikin   Engineer, mining Education   General Education Heart Disease Risk Reduction   Instruction Review Code 1- Verbalizes Understanding   Class Start Time 1405   Class Stop Time 1450   Class Time Calculation (min) 45 min    Row Name 10/26/22 1600     Education   Cardiac Education Topics Pritikin   Select Workshops     Workshops   Educator Exercise Physiologist   Select Exercise   Exercise Workshop Location manager and Fall Prevention   Instruction Review Code 1- Verbalizes Understanding   Class Start Time 1405   Class Stop Time 1445   Class Time Calculation (min) 40 min            Core Videos: Exercise    Move It!  Clinical staff conducted group or individual video education with verbal and written material and guidebook.  Patient learns the recommended Pritikin exercise program. Exercise with the goal of living a long, healthy life. Some of the health benefits of exercise include controlled diabetes, healthier blood pressure levels, improved cholesterol levels, improved heart and lung capacity, improved sleep, and better body composition. Everyone should speak with their doctor before starting or changing an exercise routine.  Biomechanical Limitations Clinical staff conducted group or individual video education with verbal and written material and guidebook.  Patient learns how biomechanical limitations can impact exercise  and how we can mitigate and possibly overcome limitations to have an impactful and balanced exercise routine.  Body Composition Clinical staff conducted group or individual video education with verbal and written material and guidebook.  Patient learns that body composition (ratio of muscle mass to fat mass) is a key component to assessing overall fitness, rather than body weight alone. Increased fat mass, especially visceral belly fat, can put [place] at increased risk for metabolic syndrome, type 2 diabetes, heart disease, and even death. It is recommended to combine diet and exercise (cardiovascular and resistance training) to improve your body composition. Seek guidance from your physician and exercise physiologist before implementing an exercise routine.  Exercise Action Plan Clinical staff conducted group or individual video education with verbal and written material and guidebook.  Patient learns the recommended strategies to achieve and enjoy long-term exercise adherence, including variety, self-motivation, self-efficacy, and positive decision making. Benefits of exercise include fitness, good health, weight management, more energy, better sleep, less stress, and overall well-being.  Medical   Heart Disease Risk Reduction Clinical staff conducted group or individual video education with verbal and written material and guidebook.  Patient  learns our heart is our most vital organ as it circulates oxygen, nutrients, white blood cells, and hormones throughout the entire body, and carries waste away. Data supports a plant-based eating plan like the Pritikin Program for its effectiveness in slowing progression of and reversing heart disease. The video provides a number of recommendations to address heart disease.   Metabolic Syndrome and Belly Fat  Clinical staff conducted group or individual video education with verbal and written material and guidebook.  Patient learns what metabolic syndrome is, how  it leads to heart disease, and how one can reverse it and keep it from coming back. You have metabolic syndrome if you have 3 of the following 5 criteria: abdominal obesity, high blood pressure, high triglycerides, low HDL cholesterol, and high blood sugar.  Hypertension and Heart Disease Clinical staff conducted group or individual video education with verbal and written material and guidebook.  Patient learns that high blood pressure, or hypertension, is very common in the [place]. Hypertension is largely due to excessive salt intake, but other important risk factors include being overweight, physical inactivity, drinking too much alcohol, smoking, and not eating enough potassium from fruits and vegetables. High blood pressure is a leading risk factor for heart attack, stroke, congestive heart failure, dementia, kidney failure, and premature death. Long-term effects of excessive salt intake include stiffening of the arteries and thickening of heart muscle and organ damage. Recommendations include ways to reduce hypertension and the risk of heart disease.  Diseases of Our Time - Focusing on Diabetes Clinical staff conducted group or individual video education with verbal and written material and guidebook.  Patient learns why the best way to stop diseases of our time is prevention, through food and other lifestyle changes. Medicine (such as prescription pills and surgeries) is often only a Band-Aid on the problem, not a long-term solution. Most common diseases of our time include obesity, type 2 diabetes, hypertension, heart disease, and cancer. The Pritikin Program is recommended and has been proven to help reduce, reverse, and/or prevent the damaging effects of metabolic syndrome.  Nutrition   Overview of the Pritikin Eating Plan  Clinical staff conducted group or individual video education with verbal and written material and guidebook.  Patient learns about the Pritikin Eating Plan for  disease risk reduction. The Pritikin Eating Plan emphasizes a wide variety of unrefined, minimally-processed carbohydrates, like fruits, vegetables, whole grains, and legumes. Go, Caution, and Stop food choices are explained. Plant-based and lean animal proteins are emphasized. Rationale provided for low sodium intake for blood pressure control, low added sugars for blood sugar stabilization, and low added fats and oils for coronary artery disease risk reduction and weight management.  Calorie Density  Clinical staff conducted group or individual video education with verbal and written material and guidebook.  Patient learns about calorie density and how it impacts the Pritikin Eating Plan. Knowing the characteristics of the food you choose will help you decide whether those foods will lead to weight gain or weight loss, and whether you want to consume more or less of them. Weight loss is usually a side effect of the Pritikin Eating Plan because of its focus on low calorie-dense foods.  Label Reading  Clinical staff conducted group or individual video education with verbal and written material and guidebook.  Patient learns about the Pritikin recommended label reading guidelines and corresponding recommendations regarding calorie density, added sugars, sodium content, and whole grains.  Dining Out - Part 1  Clinical staff conducted group  or individual video education with verbal and written material and guidebook.  Patient learns that restaurant meals can be sabotaging because they can be so high in calories, fat, sodium, and/or sugar. Patient learns recommended strategies on how to positively address this and avoid unhealthy pitfalls.  Facts on Fats  Clinical staff conducted group or individual video education with verbal and written material and guidebook.  Patient learns that lifestyle modifications can be just as effective, if not more so, as many medications for lowering your risk of heart  disease. A Pritikin lifestyle can help to reduce your risk of inflammation and atherosclerosis (cholesterol build-up, or plaque, in the artery walls). Lifestyle interventions such as dietary choices and physical activity address the cause of atherosclerosis. A review of the types of fats and their impact on blood cholesterol levels, along with dietary recommendations to reduce fat intake is also included.  Nutrition Action Plan  Clinical staff conducted group or individual video education with verbal and written material and guidebook.  Patient learns how to incorporate Pritikin recommendations into their lifestyle. Recommendations include planning and keeping personal health goals in mind as an important part of their success.  Healthy Mind-Set    Healthy Minds, Bodies, Hearts  Clinical staff conducted group or individual video education with verbal and written material and guidebook.  Patient learns how to identify when they are stressed. Video will discuss the impact of that stress, as well as the many benefits of stress management. Patient will also be introduced to stress management techniques. The way we think, act, and feel has an impact on our hearts.  How Our Thoughts Can Heal Our Hearts  Clinical staff conducted group or individual video education with verbal and written material and guidebook.  Patient learns that negative thoughts can cause depression and anxiety. This can result in negative lifestyle behavior and serious health problems. Cognitive behavioral therapy is an effective method to help control our thoughts in order to change and improve our emotional outlook.  Additional Videos:  Exercise    Improving Performance  Clinical staff conducted group or individual video education with verbal and written material and guidebook.  Patient learns to use a non-linear approach by alternating intensity levels and lengths of time spent exercising to help burn more calories and lose more  body fat. Cardiovascular exercise helps improve heart health, metabolism, hormonal balance, blood sugar control, and recovery from fatigue. Resistance training improves strength, endurance, balance, coordination, reaction time, metabolism, and muscle mass. Flexibility exercise improves circulation, posture, and balance. Seek guidance from your physician and exercise physiologist before implementing an exercise routine and learn your capabilities and proper form for all exercise.  Introduction to Yoga  Clinical staff conducted group or individual video education with verbal and written material and guidebook.  Patient learns about yoga, a discipline of the coming together of mind, breath, and body. The benefits of yoga include improved flexibility, improved range of motion, better posture and core strength, increased lung function, weight loss, and positive self-image. Yoga's heart health benefits include lowered blood pressure, healthier heart rate, decreased cholesterol and triglyceride levels, improved immune function, and reduced stress. Seek guidance from your physician and exercise physiologist before implementing an exercise routine and learn your capabilities and proper form for all exercise.  Medical   Aging: Enhancing Your Quality of Life  Clinical staff conducted group or individual video education with verbal and written material and guidebook.  Patient learns key strategies and recommendations to stay in good physical health  and enhance quality of life, such as prevention strategies, having an advocate, securing a Health Care Proxy and Power of Attorney, and keeping a list of medications and system for tracking them. It also discusses how to avoid risk for bone loss.  Biology of Weight Control  Clinical staff conducted group or individual video education with verbal and written material and guidebook.  Patient learns that weight gain occurs because we consume more calories than we burn  (eating more, moving less). Even if your body weight is normal, you may have higher ratios of fat compared to muscle mass. Too much body fat puts you at increased risk for cardiovascular disease, heart attack, stroke, type 2 diabetes, and obesity-related cancers. In addition to exercise, following the Pritikin Eating Plan can help reduce your risk.  Decoding Lab Results  Clinical staff conducted group or individual video education with verbal and written material and guidebook.  Patient learns that lab test reflects one measurement whose values change over time and are influenced by many factors, including medication, stress, sleep, exercise, food, hydration, pre-existing medical conditions, and more. It is recommended to use the knowledge from this video to become more involved with your lab results and evaluate your numbers to speak with your doctor.   Diseases of Our Time - Overview  Clinical staff conducted group or individual video education with verbal and written material and guidebook.  Patient learns that according to the CDC, 50% to 70% of chronic diseases (such as obesity, type 2 diabetes, elevated lipids, hypertension, and heart disease) are avoidable through lifestyle improvements including healthier food choices, listening to satiety cues, and increased physical activity.  Sleep Disorders Clinical staff conducted group or individual video education with verbal and written material and guidebook.  Patient learns how good quality and duration of sleep are important to overall health and well-being. Patient also learns about sleep disorders and how they impact health along with recommendations to address them, including discussing with a physician.  Nutrition  Dining Out - Part 2 Clinical staff conducted group or individual video education with verbal and written material and guidebook.  Patient learns how to plan ahead and communicate in order to maximize their dining experience in a  healthy and nutritious manner. Included are recommended food choices based on the type of restaurant the patient is visiting.   Fueling a Banker conducted group or individual video education with verbal and written material and guidebook.  There is a strong connection between our food choices and our health. Diseases like obesity and type 2 diabetes are very prevalent and are in large-part due to lifestyle choices. The Pritikin Eating Plan provides plenty of food and hunger-curbing satisfaction. It is easy to follow, affordable, and helps reduce health risks.  Menu Workshop  Clinical staff conducted group or individual video education with verbal and written material and guidebook.  Patient learns that restaurant meals can sabotage health goals because they are often packed with calories, fat, sodium, and sugar. Recommendations include strategies to plan ahead and to communicate with the manager, chef, or server to help order a healthier meal.  Planning Your Eating Strategy  Clinical staff conducted group or individual video education with verbal and written material and guidebook.  Patient learns about the Pritikin Eating Plan and its benefit of reducing the risk of disease. The Pritikin Eating Plan does not focus on calories. Instead, it emphasizes high-quality, nutrient-rich foods. By knowing the characteristics of the foods, we choose, we can  determine their calorie density and make informed decisions.  Targeting Your Nutrition Priorities  Clinical staff conducted group or individual video education with verbal and written material and guidebook.  Patient learns that lifestyle habits have a tremendous impact on disease risk and progression. This video provides eating and physical activity recommendations based on your personal health goals, such as reducing LDL cholesterol, losing weight, preventing or controlling type 2 diabetes, and reducing high blood  pressure.  Vitamins and Minerals  Clinical staff conducted group or individual video education with verbal and written material and guidebook.  Patient learns different ways to obtain key vitamins and minerals, including through a recommended healthy diet. It is important to discuss all supplements you take with your doctor.   Healthy Mind-Set    Smoking Cessation  Clinical staff conducted group or individual video education with verbal and written material and guidebook.  Patient learns that cigarette smoking and tobacco addiction pose a serious health risk which affects millions of people. Stopping smoking will significantly reduce the risk of heart disease, lung disease, and many forms of cancer. Recommended strategies for quitting are covered, including working with your doctor to develop a successful plan.  Culinary   Becoming a Set designer conducted group or individual video education with verbal and written material and guidebook.  Patient learns that cooking at home can be healthy, cost-effective, quick, and puts them in control. Keys to cooking healthy recipes will include looking at your recipe, assessing your equipment needs, planning ahead, making it simple, choosing cost-effective seasonal ingredients, and limiting the use of added fats, salts, and sugars.  Cooking - Breakfast and Snacks  Clinical staff conducted group or individual video education with verbal and written material and guidebook.  Patient learns how important breakfast is to satiety and nutrition through the entire day. Recommendations include key foods to eat during breakfast to help stabilize blood sugar levels and to prevent overeating at meals later in the day. Planning ahead is also a key component.  Cooking - Educational psychologist conducted group or individual video education with verbal and written material and guidebook.  Patient learns eating strategies to improve overall  health, including an approach to cook more at home. Recommendations include thinking of animal protein as a side on your plate rather than center stage and focusing instead on lower calorie dense options like vegetables, fruits, whole grains, and plant-based proteins, such as beans. Making sauces in large quantities to freeze for later and leaving the skin on your vegetables are also recommended to maximize your experience.  Cooking - Healthy Salads and Dressing Clinical staff conducted group or individual video education with verbal and written material and guidebook.  Patient learns that vegetables, fruits, whole grains, and legumes are the foundations of the Pritikin Eating Plan. Recommendations include how to incorporate each of these in flavorful and healthy salads, and how to create homemade salad dressings. Proper handling of ingredients is also covered. Cooking - Soups and [employer] - Soups and Desserts Clinical staff conducted group or individual video education with verbal and written material and guidebook.  Patient learns that Pritikin soups and desserts make for easy, nutritious, and delicious snacks and meal components that are low in sodium, fat, sugar, and calorie density, while high in vitamins, minerals, and filling fiber. Recommendations include simple and healthy ideas for soups and desserts.   Overview     The Pritikin Solution Program Overview Clinical staff conducted group or  individual video education with verbal and written material and guidebook.  Patient learns that the results of the Pritikin Program have been documented in more than 100 articles published in peer-reviewed journals, and the benefits include reducing risk factors for (and, in some cases, even reversing) high cholesterol, high blood pressure, type 2 diabetes, obesity, and more! An overview of the three key pillars of the Pritikin Program will be covered: eating well, doing regular exercise, and having a  healthy mind-set.  WORKSHOPS  Exercise: Exercise Basics: Building Your Action Plan Clinical staff led group instruction and group discussion with PowerPoint presentation and patient guidebook. To enhance the learning environment the use of posters, models and videos may be added. At the conclusion of this workshop, patients will comprehend the difference between physical activity and exercise, as well as the benefits of incorporating both, into their routine. Patients will understand the FITT (Frequency, Intensity, Time, and Type) principle and how to use it to build an exercise action plan. In addition, safety concerns and other considerations for exercise and cardiac rehab will be addressed by the presenter. The purpose of this lesson is to promote a comprehensive and effective weekly exercise routine in order to improve patients' overall level of fitness.   Managing Heart Disease: Your Path to a Healthier Heart Clinical staff led group instruction and group discussion with PowerPoint presentation and patient guidebook. To enhance the learning environment the use of posters, models and videos may be added.At the conclusion of this workshop, patients will understand the anatomy and physiology of the heart. Additionally, they will understand how Pritikin's three pillars impact the risk factors, the progression, and the management of heart disease.  The purpose of this lesson is to provide a high-level overview of the heart, heart disease, and how the Pritikin lifestyle positively impacts risk factors.  Exercise Biomechanics Clinical staff led group instruction and group discussion with PowerPoint presentation and patient guidebook. To enhance the learning environment the use of posters, models and videos may be added. Patients will learn how the structural parts of their bodies function and how these functions impact their daily activities, movement, and exercise. Patients will learn how to  promote a neutral spine, learn how to manage pain, and identify ways to improve their physical movement in order to promote healthy living. The purpose of this lesson is to expose patients to common physical limitations that impact physical activity. Participants will learn practical ways to adapt and manage aches and pains, and to minimize their effect on regular exercise. Patients will learn how to maintain good posture while sitting, walking, and lifting.  Balance Training and Fall Prevention  Clinical staff led group instruction and group discussion with PowerPoint presentation and patient guidebook. To enhance the learning environment the use of posters, models and videos may be added. At the conclusion of this workshop, patients will understand the importance of their sensorimotor skills (vision, proprioception, and the vestibular system) in maintaining their ability to balance as they age. Patients will apply a variety of balancing exercises that are appropriate for their current level of function. Patients will understand the common causes for poor balance, possible solutions to these problems, and ways to modify their physical environment in order to minimize their fall risk. The purpose of this lesson is to teach patients about the importance of maintaining balance as they age and ways to minimize their risk of falling.  WORKSHOPS   Nutrition:  Fueling a Ship broker led group instruction and group  discussion with PowerPoint presentation and patient guidebook. To enhance the learning environment the use of posters, models and videos may be added. Patients will review the foundational principles of the Pritikin Eating Plan and understand what constitutes a serving size in each of the food groups. Patients will also learn Pritikin-friendly foods that are better choices when away from home and review make-ahead meal and snack options. Calorie density will be reviewed and  applied to three nutrition priorities: weight maintenance, weight loss, and weight gain. The purpose of this lesson is to reinforce (in a group setting) the key concepts around what patients are recommended to eat and how to apply these guidelines when away from home by planning and selecting Pritikin-friendly options. Patients will understand how calorie density may be adjusted for different weight management goals.  Mindful Eating  Clinical staff led group instruction and group discussion with PowerPoint presentation and patient guidebook. To enhance the learning environment the use of posters, models and videos may be added. Patients will briefly review the concepts of the Pritikin Eating Plan and the importance of low-calorie dense foods. The concept of mindful eating will be introduced as well as the importance of paying attention to internal hunger signals. Triggers for non-hunger eating and techniques for dealing with triggers will be explored. The purpose of this lesson is to provide patients with the opportunity to review the basic principles of the Pritikin Eating Plan, discuss the value of eating mindfully and how to measure internal cues of hunger and fullness using the Hunger Scale. Patients will also discuss reasons for non-hunger eating and learn strategies to use for controlling emotional eating.  Targeting Your Nutrition Priorities Clinical staff led group instruction and group discussion with PowerPoint presentation and patient guidebook. To enhance the learning environment the use of posters, models and videos may be added. Patients will learn how to determine their genetic susceptibility to disease by reviewing their family history. Patients will gain insight into the importance of diet as part of an overall healthy lifestyle in mitigating the impact of genetics and other environmental insults. The purpose of this lesson is to provide patients with the opportunity to assess their personal  nutrition priorities by looking at their family history, their own health history and current risk factors. Patients will also be able to discuss ways of prioritizing and modifying the Pritikin Eating Plan for their highest risk areas  Menu  Clinical staff led group instruction and group discussion with PowerPoint presentation and patient guidebook. To enhance the learning environment the use of posters, models and videos may be added. Using menus brought in from [employer], or printed from [employer], patients will apply the Pritikin dining out guidelines that were presented in the [employer] video. Patients will also be able to practice these guidelines in a variety of provided scenarios. The purpose of this lesson is to provide patients with the opportunity to practice hands-on learning of the Pritikin Dining Out guidelines with actual menus and practice scenarios.  Label Reading Clinical staff led group instruction and group discussion with PowerPoint presentation and patient guidebook. To enhance the learning environment the use of posters, models and videos may be added. Patients will review and discuss the Pritikin label reading guidelines presented in Pritikin's Label Reading Educational series video. Using fool labels brought in from local grocery stores and markets, patients will apply the label reading guidelines and determine if the packaged food meet the Pritikin guidelines. The purpose of this lesson is  to provide patients with the opportunity to review, discuss, and practice hands-on learning of the Pritikin Label Reading guidelines with actual packaged food labels. Cooking School  Pritikin's [employer] are designed to teach patients ways to prepare quick, simple, and affordable recipes at home. The importance of nutrition's role in chronic disease risk reduction is reflected in its emphasis in the overall Pritikin program. By learning how to prepare  essential core Pritikin Eating Plan recipes, patients will increase control over what they eat; be able to customize the flavor of foods without the use of added salt, sugar, or fat; and improve the quality of the food they consume. By learning a set of core recipes which are easily assembled, quickly prepared, and affordable, patients are more likely to prepare more healthy foods at home. These workshops focus on convenient breakfasts, simple entres, side dishes, and desserts which can be prepared with minimal effort and are consistent with nutrition recommendations for cardiovascular risk reduction. Cooking [employer] are taught by a Armed forces logistics/support/administrative officer (RD) who has been trained by the [employer]. The chef or RD has a clear understanding of the importance of minimizing - if not completely eliminating - added fat, sugar, and sodium in recipes. Throughout the series of Cooking School Workshop sessions, patients will learn about healthy ingredients and efficient methods of cooking to build confidence in their capability to prepare    Cooking School weekly topics:  Adding Flavor- Sodium-Free  Fast and Healthy Breakfasts  Powerhouse Plant-Based Proteins  Satisfying Salads and Dressings  Simple Sides and Sauces  International Cuisine-Spotlight on the [employer] Zones  Delicious Desserts  Savory Soups  [employer] - Meals in a Astronomer Appetizers and Snacks  Comforting Weekend Breakfasts  One-Pot Wonders   Fast Evening Meals  Landscape architect Your Pritikin Plate  WORKSHOPS   Healthy Mindset (Psychosocial):  Focused Goals, Sustainable Changes Clinical staff led group instruction and group discussion with PowerPoint presentation and patient guidebook. To enhance the learning environment the use of posters, models and videos may be added. Patients will be able to apply effective goal setting strategies to establish at least one personal goal, and  then take consistent, meaningful action toward that goal. They will learn to identify common barriers to achieving personal goals and develop strategies to overcome them. Patients will also gain an understanding of how our mind-set can impact our ability to achieve goals and the importance of cultivating a positive and growth-oriented mind-set. The purpose of this lesson is to provide patients with a deeper understanding of how to set and achieve personal goals, as well as the tools and strategies needed to overcome common obstacles which may arise along the way.  From Head to Heart: The Power of a Healthy Outlook  Clinical staff led group instruction and group discussion with PowerPoint presentation and patient guidebook. To enhance the learning environment the use of posters, models and videos may be added. Patients will be able to recognize and describe the impact of emotions and mood on physical health. They will discover the importance of self-care and explore self-care practices which may work for them. Patients will also learn how to utilize the 4 C's to cultivate a healthier outlook and better manage stress and challenges. The purpose of this lesson is to demonstrate to patients how a healthy outlook is an essential part of maintaining good health, especially as they continue their cardiac rehab journey.  Healthy Sleep for a Healthy  Heart Clinical staff led group instruction and group discussion with PowerPoint presentation and patient guidebook. To enhance the learning environment the use of posters, models and videos may be added. At the conclusion of this workshop, patients will be able to demonstrate knowledge of the importance of sleep to overall health, well-being, and quality of life. They will understand the symptoms of, and treatments for, common sleep disorders. Patients will also be able to identify daytime and nighttime behaviors which impact sleep, and they will be able to apply these  tools to help manage sleep-related challenges. The purpose of this lesson is to provide patients with a general overview of sleep and outline the importance of quality sleep. Patients will learn about a few of the most common sleep disorders. Patients will also be introduced to the concept of "sleep hygiene," and discover ways to self-manage certain sleeping problems through simple daily behavior changes. Finally, the workshop will motivate patients by clarifying the links between quality sleep and their goals of heart-healthy living.   Recognizing and Reducing Stress Clinical staff led group instruction and group discussion with PowerPoint presentation and patient guidebook. To enhance the learning environment the use of posters, models and videos may be added. At the conclusion of this workshop, patients will be able to understand the types of stress reactions, differentiate between acute and chronic stress, and recognize the impact that chronic stress has on their health. They will also be able to apply different coping mechanisms, such as reframing negative self-talk. Patients will have the opportunity to practice a variety of stress management techniques, such as deep abdominal breathing, progressive muscle relaxation, and/or guided imagery.  The purpose of this lesson is to educate patients on the role of stress in their lives and to provide healthy techniques for coping with it.  Learning Barriers/Preferences:  Learning Barriers/Preferences - 10/12/22 1354       Learning Barriers/Preferences   Learning Barriers Sight   wears glasses   Learning Preferences Audio;Computer/Internet;Group Instruction;Individual Instruction;Pictoral;Skilled Demonstration;Verbal Instruction;Video;Written Material             Education Topics:  Knowledge Questionnaire Score:  Knowledge Questionnaire Score - 10/12/22 1355       Knowledge Questionnaire Score   Pre Score 26/28             Core  Components/Risk Factors/Patient Goals at Admission:  Personal Goals and Risk Factors at Admission - 10/12/22 1040       Core Components/Risk Factors/Patient Goals on Admission    Weight Management Yes;Obesity;Weight Loss    Intervention Weight Management: Develop a combined nutrition and exercise program designed to reach desired caloric intake, while maintaining appropriate intake of nutrient and fiber, sodium and fats, and appropriate energy expenditure required for the weight goal.;Weight Management: Provide education and appropriate resources to help participant work on and attain dietary goals.;Weight Management/Obesity: Establish reasonable short term and long term weight goals.;Obesity: Provide education and appropriate resources to help participant work on and attain dietary goals.    Expected Outcomes Short Term: Continue to assess and modify interventions until short term weight is achieved;Long Term: Adherence to nutrition and physical activity/exercise program aimed toward attainment of established weight goal;Weight Loss: Understanding of general recommendations for a balanced deficit meal plan, which promotes 1-2 lb weight loss per week and includes a negative energy balance of [phone] kcal/d;Understanding recommendations for meals to include 15-35% energy as protein, 25-35% energy from fat, 35-60% energy from carbohydrates, less than 200mg  of dietary cholesterol, 20-35 gm of total  fiber daily;Understanding of distribution of calorie intake throughout the day with the consumption of 4-5 meals/snacks    Tobacco Cessation Yes    Number of packs per day 3 cig/ day    Intervention Assist the participant in steps to quit. Provide individualized education and counseling about committing to Tobacco Cessation, relapse prevention, and pharmacological support that can be provided by physician.;Education officer, environmental, assist with locating and accessing local/national Quit Smoking programs, and  support quit date choice.    Expected Outcomes Short Term: Will demonstrate readiness to quit, by selecting a quit date.;Long Term: Complete abstinence from all tobacco products for at least 12 months from quit date.;Short Term: Will quit all tobacco product use, adhering to prevention of relapse plan.    Hypertension Yes    Intervention Provide education on lifestyle modifcations including regular physical activity/exercise, weight management, moderate sodium restriction and increased consumption of fresh fruit, vegetables, and low fat dairy, alcohol moderation, and smoking cessation.;Monitor prescription use compliance.    Expected Outcomes Long Term: Maintenance of blood pressure at goal levels.;Short Term: Continued assessment and intervention until BP is < 140/8mm HG in hypertensive participants. < 130/62mm HG in hypertensive participants with diabetes, heart failure or chronic kidney disease.    Lipids Yes    Intervention Provide education and support for participant on nutrition & aerobic/resistive exercise along with prescribed medications to achieve LDL 70mg , HDL >40mg .    Expected Outcomes Short Term: Participant states understanding of desired cholesterol values and is compliant with medications prescribed. Participant is following exercise prescription and nutrition guidelines.;Long Term: Cholesterol controlled with medications as prescribed, with individualized exercise RX and with personalized nutrition plan. Value goals: LDL < 70mg , HDL > 40 mg.    Stress Yes    Intervention Offer individual and/or small group education and counseling on adjustment to heart disease, stress management and health-related lifestyle change. Teach and support self-help strategies.;Refer participants experiencing significant psychosocial distress to appropriate mental health specialists for further evaluation and treatment. When possible, include family members and significant others in education/counseling  sessions.    Expected Outcomes Short Term: Participant demonstrates changes in health-related behavior, relaxation and other stress management skills, ability to obtain effective social support, and compliance with psychotropic medications if prescribed.;Long Term: Emotional wellbeing is indicated by absence of clinically significant psychosocial distress or social isolation.             Core Components/Risk Factors/Patient Goals Review:    Core Components/Risk Factors/Patient Goals at Discharge (Final Review):    ITP Comments:  ITP Comments     Row Name 10/12/22 1037 10/22/22 1610         ITP Comments Dr. Armanda Magic medical director. Introduction to pritikin education program/ intensive cardiac rehab. Initial orientation packet reviewed with patient. 30 Day ITP Review. Chelse started intensive cardiac rehab on 10/21/22 and did well with exercise.               Comments: See ITP comments.Thayer Headings RN BSN

## 2022-10-30 ENCOUNTER — Encounter (HOSPITAL_COMMUNITY)
Admission: RE | Admit: 2022-10-30 | Discharge: 2022-10-30 | Disposition: A | Discharge status: Home / Self Care | Payer: 59 | Source: Ambulatory Visit | Attending: Cardiovascular Disease | Admitting: Cardiovascular Disease

## 2022-10-30 DIAGNOSIS — I214 Non-ST elevation (NSTEMI) myocardial infarction: Secondary | ICD-10-CM

## 2022-10-30 DIAGNOSIS — Z955 Presence of coronary angioplasty implant and graft: Secondary | ICD-10-CM

## 2022-11-02 ENCOUNTER — Encounter (HOSPITAL_COMMUNITY): Payer: 59

## 2022-11-04 ENCOUNTER — Encounter (HOSPITAL_COMMUNITY)
Admission: RE | Admit: 2022-11-04 | Discharge: 2022-11-04 | Disposition: A | Discharge status: Home / Self Care | Payer: 59 | Source: Ambulatory Visit | Attending: Cardiovascular Disease | Admitting: Cardiovascular Disease

## 2022-11-04 DIAGNOSIS — I214 Non-ST elevation (NSTEMI) myocardial infarction: Secondary | ICD-10-CM | POA: Diagnosis not present

## 2022-11-04 DIAGNOSIS — Z955 Presence of coronary angioplasty implant and graft: Secondary | ICD-10-CM

## 2022-11-06 ENCOUNTER — Encounter (HOSPITAL_COMMUNITY)
Admission: RE | Admit: 2022-11-06 | Discharge: 2022-11-06 | Disposition: A | Discharge status: Home / Self Care | Payer: 59 | Source: Ambulatory Visit | Attending: Cardiovascular Disease | Admitting: Cardiovascular Disease

## 2022-11-06 DIAGNOSIS — I214 Non-ST elevation (NSTEMI) myocardial infarction: Secondary | ICD-10-CM

## 2022-11-06 DIAGNOSIS — Z955 Presence of coronary angioplasty implant and graft: Secondary | ICD-10-CM

## 2022-11-09 ENCOUNTER — Encounter (HOSPITAL_COMMUNITY)
Admission: RE | Admit: 2022-11-09 | Discharge: 2022-11-09 | Disposition: A | Discharge status: Home / Self Care | Payer: 59 | Source: Ambulatory Visit | Attending: Cardiovascular Disease | Admitting: Cardiovascular Disease

## 2022-11-09 DIAGNOSIS — I214 Non-ST elevation (NSTEMI) myocardial infarction: Secondary | ICD-10-CM

## 2022-11-09 DIAGNOSIS — Z955 Presence of coronary angioplasty implant and graft: Secondary | ICD-10-CM

## 2022-11-11 ENCOUNTER — Encounter (HOSPITAL_COMMUNITY)
Admission: RE | Admit: 2022-11-11 | Discharge: 2022-11-11 | Disposition: A | Discharge status: Home / Self Care | Payer: 59 | Source: Ambulatory Visit | Attending: Cardiovascular Disease | Admitting: Cardiovascular Disease

## 2022-11-11 DIAGNOSIS — I214 Non-ST elevation (NSTEMI) myocardial infarction: Secondary | ICD-10-CM | POA: Diagnosis not present

## 2022-11-11 DIAGNOSIS — Z955 Presence of coronary angioplasty implant and graft: Secondary | ICD-10-CM

## 2022-11-13 ENCOUNTER — Encounter (HOSPITAL_COMMUNITY)
Admission: RE | Admit: 2022-11-13 | Discharge: 2022-11-13 | Disposition: A | Discharge status: Home / Self Care | Payer: 59 | Source: Ambulatory Visit | Attending: Cardiovascular Disease | Admitting: Cardiovascular Disease

## 2022-11-13 DIAGNOSIS — I214 Non-ST elevation (NSTEMI) myocardial infarction: Secondary | ICD-10-CM | POA: Diagnosis not present

## 2022-11-13 DIAGNOSIS — Z955 Presence of coronary angioplasty implant and graft: Secondary | ICD-10-CM

## 2022-11-16 ENCOUNTER — Encounter (HOSPITAL_COMMUNITY)
Admission: RE | Admit: 2022-11-16 | Discharge: 2022-11-16 | Disposition: A | Discharge status: Home / Self Care | Payer: 59 | Source: Ambulatory Visit | Attending: Cardiovascular Disease | Admitting: Cardiovascular Disease

## 2022-11-16 DIAGNOSIS — Z955 Presence of coronary angioplasty implant and graft: Secondary | ICD-10-CM

## 2022-11-16 DIAGNOSIS — I214 Non-ST elevation (NSTEMI) myocardial infarction: Secondary | ICD-10-CM

## 2022-11-18 ENCOUNTER — Encounter (HOSPITAL_COMMUNITY)
Admission: RE | Admit: 2022-11-18 | Discharge: 2022-11-18 | Disposition: A | Discharge status: Home / Self Care | Payer: 59 | Source: Ambulatory Visit | Attending: Cardiovascular Disease | Admitting: Cardiovascular Disease

## 2022-11-18 DIAGNOSIS — R079 Chest pain, unspecified: Secondary | ICD-10-CM | POA: Diagnosis not present

## 2022-11-18 DIAGNOSIS — Z955 Presence of coronary angioplasty implant and graft: Secondary | ICD-10-CM | POA: Diagnosis not present

## 2022-11-18 DIAGNOSIS — I214 Non-ST elevation (NSTEMI) myocardial infarction: Secondary | ICD-10-CM

## 2022-11-18 NOTE — Progress Notes (Signed)
Pt education on NTG. For chest pain, tightness, or pressure. While sitting, place 1 tablet under tongue. May be used every 5 minutes as needed, for up to 15 minutes. Do not use more than 3 tablets. Pt verbalized understanding.

## 2022-11-18 NOTE — Progress Notes (Signed)
Incomplete Session Note  Patient Details  Name: [name] MRN: [id number] Date of Birth: 1965-09-01 Referring Provider:   Flowsheet Row INTENSIVE CARDIAC REHAB ORIENT from 10/12/2022 in [facility]  Referring Provider Verne Carrow, MD       [name] did not complete her rehab session.  [name] reports having intermittent  right anterior chest pain that she describes as throbbing that lasted a few minutes  2-3 times today  starting this morning when she woke up. Blood pressure 138/82 heart rate 57. Oxygen saturation 99% on room air.  Telemetry rhythm Sinus Huston Foley.  [name] reported that she had chest pain/ throbbing when she got off the elevator coming to cardiac rehab to exercise. [name] said the pain was a 5/10. [name] denied any diaphoresis, nausea or shortness of breath. Skin warm and dry. Patient was taken to the treatment room. Onsite provider [name] notified. 12 lead ordered and obtained. [name] reviewed 12 lead ECG noted no changes. Patient instructed per [name] to call if she has any recurrent chest pain. Patient has sublingual nitroglycerin instructed to use if needed and to go the ED for evaluation. [name] denied having any further pain when 12 lead ECG was completed. Patient was advised not to exercise today. If [name] is pain free she may return to exercise per [name] on Friday. [name] left cardiac rehab without complaints or chest pain.Will continue to monitor the patient throughout  the program. [name], RN,BSN 11/18/2022 2:28 PM

## 2022-11-19 NOTE — Progress Notes (Signed)
Cardiac Individual Treatment Plan  Patient Details  Name: [name] MRN: [id number] Date of Birth: 1965/09/30 Referring Provider:   Flowsheet Row INTENSIVE CARDIAC REHAB ORIENT from 10/12/2022 in [facility]  Referring Provider Verne Carrow, MD       Initial Encounter Date:  Flowsheet Row INTENSIVE CARDIAC REHAB ORIENT from 10/12/2022 in [facility]  Date 10/12/22       Visit Diagnosis: 09/14/22 NSTEMI (non-ST elevated myocardial infarction) (HCC)  09/15/22 DES LAD  Chest pain, unspecified type - Plan: EKG 12-Lead, EKG 12-Lead  Patient's Home Medications on Admission:  Current Outpatient Medications:    amLODipine (NORVASC) 5 MG tablet, Take 5 mg by mouth daily., Disp: , Rfl:    atorvastatin (LIPITOR) 80 MG tablet, Take 1 tablet (80 mg total) by mouth daily., Disp: 90 tablet, Rfl: 1   carisoprodol (SOMA) 350 MG tablet, Take 350 mg by mouth daily as needed for muscle spasms., Disp: , Rfl:    clopidogrel (PLAVIX) 75 MG tablet, Take 1 tablet (75 mg total) by mouth daily with breakfast., Disp: 90 tablet, Rfl: 2   diclofenac Sodium (VOLTAREN) 1 % GEL, Apply 1 Application topically as needed for moderate pain., Disp: , Rfl:    folic acid (FOLVITE) 400 MCG tablet, Take 400 mcg by mouth daily., Disp: , Rfl:    HYDROcodone-acetaminophen (NORCO) 10-325 MG tablet, Take 1 tablet by mouth up to 4 (four) times daily as needed for severe pain only., Disp: 120 tablet, Rfl: 0   levETIRAcetam (KEPPRA) 500 MG tablet, Take 1 tablet (500 mg total) by mouth 2 (two) times daily., Disp: 60 tablet, Rfl: 3   metoprolol tartrate (LOPRESSOR) 25 MG tablet, Take 1 tablet (25 mg total) by mouth 2 (two) times daily., Disp: 180 tablet, Rfl: 1   MOUNJARO 2.5 MG/0.5ML Pen, Inject 2.5 mg into the skin once a week., Disp: , Rfl:    pantoprazole (PROTONIX) 40 MG tablet, Take 40 mg by mouth  daily before breakfast. , Disp: , Rfl:    promethazine (PHENERGAN) 25 MG tablet, Take 1 tablet (25 mg total) by mouth every 6 (six) hours as needed., Disp: 30 tablet, Rfl: 0   promethazine (PHENERGAN) 6.25 MG/5ML syrup, Take 20 mLs (25 mg total) by mouth every 8 (eight) hours as needed for nausea or vomiting., Disp: 120 mL, Rfl: 1   RELISTOR 150 MG TABS, Take 3 tablets by mouth every morning., Disp: , Rfl:    solifenacin (VESICARE) 10 MG tablet, Take 10 mg by mouth daily., Disp: , Rfl:    nitroGLYCERIN (NITROSTAT) 0.4 MG SL tablet, Place 1 tablet (0.4 mg total) under the tongue every 5 (five) minutes x 3 doses as needed for chest pain. (Patient not taking: Reported on 11/18/2022), Disp: 25 tablet, Rfl: 2  Past Medical History: Past Medical History:  Diagnosis Date   Arthritis    Carpal tunnel syndrome    chronoic muscle spasms  & back pain   Chronic back pain    Fibroids    uterine   GERD (gastroesophageal reflux disease)    Hypertension    Seizures (HCC)    last seizure 4-5 years ago/on keppra   Single stillborn delivery outcome    Smoker     Tobacco Use: Social History   Tobacco Use  Smoking Status Every Day   Packs/day: 0.50   Years: 30.00   Additional pack years: 0.00  Total pack years: 15.00   Types: Cigarettes  Smokeless Tobacco Never    Labs: Review Flowsheet  More data exists      Latest Ref Rng & Units 11/30/2007 12/05/2007 11/01/2020 09/07/2022 09/16/2022  Labs for ITP Cardiac and Pulmonary Rehab  Cholestrol 0 - 200 mg/dL - - 161  096  045   LDL (calc) 0 - 99 mg/dL - - 409  811  84   HDL-C >40 mg/dL - - 44  39  34   Trlycerides <150 mg/dL - - 914  782  67   Hemoglobin A1c 4.8 - 5.6 % - - - - 6.1   PH, Arterial - - 7.462  - - -  PCO2 arterial - - 31.2  - - -  Bicarbonate - - 22.0  - - -  TCO2 - 21  20.1  - - -  Acid-base deficit - - 0.7  - - -  O2 Saturation - - 99.7  - - -    Capillary Blood Glucose: Lab Results  Component Value Date   GLUCAP 94  11/20/2022   GLUCAP 98 09/16/2022   GLUCAP 116 (H) 07/26/2012     Exercise Target Goals: Exercise Program Goal: Individual exercise prescription set using results from initial 6 min walk test and THRR while considering  patient's activity barriers and safety.   Exercise Prescription Goal: Initial exercise prescription builds to 30-45 minutes a day of aerobic activity, 2-3 days per week.  Home exercise guidelines will be given to patient during program as part of exercise prescription that the participant will acknowledge.  Activity Barriers & Risk Stratification:  Activity Barriers & Cardiac Risk Stratification - 10/12/22 1348       Activity Barriers & Cardiac Risk Stratification   Activity Barriers Balance Concerns;Deconditioning;Joint Problems;Assistive Device;Arthritis;Back Problems    Cardiac Risk Stratification High   <5 METs on            6 Minute Walk:  6 Minute Walk     Row Name 10/12/22 1343         6 Minute Walk   Phase Initial     Distance 480 feet     Walk Time 6 minutes     # of Rest Breaks 1  4:30-6:00, pt became lightheaded     MPH 0.91     METS 1.4     RPE 11     Perceived Dyspnea  0     VO2 Peak 4.92     Symptoms Yes (comment)     Comments pt became lightheaded at min 4:30, had not eaten prior to visit. Sat down, given banana and lemon muffin, resolved. No pain.     Resting HR 60 bpm     Resting BP 124/82     Resting Oxygen Saturation  98 %     Exercise Oxygen Saturation  during 6 min walk 99 %     Max Ex. HR 79 bpm     Max Ex. BP 136/88     2 Minute Post BP 128/86              Oxygen Initial Assessment:   Oxygen Re-Evaluation:   Oxygen Discharge (Final Oxygen Re-Evaluation):   Initial Exercise Prescription:  Initial Exercise Prescription - 10/12/22 1300       Date of Initial Exercise RX and Referring Provider   Date 10/12/22    Referring Provider Verne Carrow, MD    Expected Discharge Date 12/25/22  NuStep   Level 1    SPM 65    Minutes 15    METs 1.5      Prescription Details   Frequency (times per week) 3    Duration Progress to 30 minutes of continuous aerobic without signs/symptoms of physical distress      Intensity   THRR 40-80% of Max Heartrate 64-129    Ratings of Perceived Exertion 11-13    Perceived Dyspnea 0-4      Progression   Progression Continue progressive overload as per policy without signs/symptoms or physical distress.      Resistance Training   Training Prescription Yes    Weight 2    Reps 10-15             Perform Capillary Blood Glucose checks as needed.  Exercise Prescription Changes:   Exercise Prescription Changes     Row Name 10/21/22 1600 11/06/22 1700           Response to Exercise   Blood Pressure (Admit) 132/78 132/84      Blood Pressure (Exercise) 130/68 142/80      Blood Pressure (Exit) 110/68 118/82      Heart Rate (Admit) 67 bpm 69 bpm      Heart Rate (Exercise) 84 bpm 97 bpm      Heart Rate (Exit) 71 bpm 68 bpm      Rating of Perceived Exertion (Exercise) 13 13      Perceived Dyspnea (Exercise) 0 0      Symptoms 3/10 rt chronic knee pain none      Comments pt first day in CRP2 Reviewed METs and goals      Duration Continue with 30 min of aerobic exercise without signs/symptoms of physical distress. Progress to 30 minutes of  aerobic without signs/symptoms of physical distress      Intensity THRR unchanged THRR unchanged        Progression   Progression Continue to progress workloads to maintain intensity without signs/symptoms of physical distress. Continue to progress workloads to maintain intensity without signs/symptoms of physical distress.      Average METs 1.6 1.8        Resistance Training   Training Prescription No  no wts on wed Yes      Weight -- 2      Reps -- 10-15      Time -- 10 Minutes        Interval Training   Interval Training No No        NuStep   Level 1 1      SPM 77 105      Minutes 30  25      METs 1.6 1.8               Exercise Comments:   Exercise Comments     Row Name 10/21/22 1644 11/06/22 1708         Exercise Comments Pt first day in CRP2 program. Pt tolerated exercise well with an avg MET level of 1.6. Pt c/o 3/10 rt chronic knee pain but was able to tolerate the nustep without issue. Pt understands her ExRx, is learning her RPE scale and THRR. Off to a great start. Will continue to progress workloads as tolerated without s/sx. Reviewed METs and goals. Pt eager to progress WL and time of exercise to acheive 30 minutes. Pt is progressing towards goals and feeling good in program.  Exercise Goals and Review:   Exercise Goals     Row Name 10/12/22 1039             Exercise Goals   Increase Physical Activity Yes       Intervention Provide advice, education, support and counseling about physical activity/exercise needs.;Develop an individualized exercise prescription for aerobic and resistive training based on initial evaluation findings, risk stratification, comorbidities and participant's personal goals.       Expected Outcomes Short Term: Attend rehab on a regular basis to increase amount of physical activity.;Long Term: Exercising regularly at least 3-5 days a week.;Long Term: Add in home exercise to make exercise part of routine and to increase amount of physical activity.       Increase Strength and Stamina Yes       Intervention Provide advice, education, support and counseling about physical activity/exercise needs.;Develop an individualized exercise prescription for aerobic and resistive training based on initial evaluation findings, risk stratification, comorbidities and participant's personal goals.       Expected Outcomes Short Term: Increase workloads from initial exercise prescription for resistance, speed, and METs.;Short Term: Perform resistance training exercises routinely during rehab and add in resistance training at  home;Long Term: Improve cardiorespiratory fitness, muscular endurance and strength as measured by increased METs and functional capacity ( )       Able to understand and use rate of perceived exertion (RPE) scale Yes       Intervention Provide education and explanation on how to use RPE scale       Expected Outcomes Short Term: Able to use RPE daily in rehab to express subjective intensity level;Long Term:  Able to use RPE to guide intensity level when exercising independently       Knowledge and understanding of Target Heart Rate Range (THRR) Yes       Intervention Provide education and explanation of THRR including how the numbers were predicted and where they are located for reference       Expected Outcomes Short Term: Able to state/look up THRR;Short Term: Able to use daily as guideline for intensity in rehab;Long Term: Able to use THRR to govern intensity when exercising independently       Understanding of Exercise Prescription Yes       Intervention Provide education, explanation, and written materials on patient's individual exercise prescription       Expected Outcomes Short Term: Able to explain program exercise prescription;Long Term: Able to explain home exercise prescription to exercise independently                Exercise Goals Re-Evaluation :  Exercise Goals Re-Evaluation     Row Name 10/21/22 1643 11/06/22 1702           Exercise Goal Re-Evaluation   Exercise Goals Review Increase Physical Activity;Understanding of Exercise Prescription;Increase Strength and Stamina;Knowledge and understanding of Target Heart Rate Range (THRR);Able to understand and use rate of perceived exertion (RPE) scale Increase Physical Activity;Understanding of Exercise Prescription;Increase Strength and Stamina;Knowledge and understanding of Target Heart Rate Range (THRR);Able to understand and use rate of perceived exertion (RPE) scale      Comments Pt first day in CRP2 program. Pt tolerated  exercise well with an avg MET level of 1.6. Pt c/o 3/10 rt chronic knee pain but was able to tolerate the nustep without issue. Pt understands her ExRx, is learning her RPE scale and THRR. Off to a great start. Reviewed METs and goals with pt. Pt is tolerating  exercise well with an avg MET level of 1.8. Discussed with pt goal of 30 min of exercise in CRP2, pt is eager to try 30 min on nustep next session. Also discussed increasing WL, which we will do once pt able to tolerate 30 min. Pt has been working on her goals of diet information and wt loss by speaking with RD regularly and feels good about progress. She has not lost wt, but is learning a lot. She feels good about learning how to modify exercise so that it's not painful and feels very good about her improvement of strength and stamina. Pt states that she overall is feeling much better and enjoying program.      Expected Outcomes Will continue to progress workloads as tolerated without s/sx. Will continue to progress workloads as tolerated, pt will achieve 30 min of continuous exercise.               Discharge Exercise Prescription (Final Exercise Prescription Changes):  Exercise Prescription Changes - 11/06/22 1700       Response to Exercise   Blood Pressure (Admit) 132/84    Blood Pressure (Exercise) 142/80    Blood Pressure (Exit) 118/82    Heart Rate (Admit) 69 bpm    Heart Rate (Exercise) 97 bpm    Heart Rate (Exit) 68 bpm    Rating of Perceived Exertion (Exercise) 13    Perceived Dyspnea (Exercise) 0    Symptoms none    Comments Reviewed METs and goals    Duration Progress to 30 minutes of  aerobic without signs/symptoms of physical distress    Intensity THRR unchanged      Progression   Progression Continue to progress workloads to maintain intensity without signs/symptoms of physical distress.    Average METs 1.8      Resistance Training   Training Prescription Yes    Weight 2    Reps 10-15    Time 10 Minutes       Interval Training   Interval Training No      NuStep   Level 1    SPM 105    Minutes 25    METs 1.8             Nutrition:  Target Goals: Understanding of nutrition guidelines, daily intake of sodium 1500mg , cholesterol 200mg , calories 30% from fat and 7% or less from saturated fats, daily to have 5 or more servings of fruits and vegetables.  Biometrics:  Pre Biometrics - 10/12/22 1056       Pre Biometrics   Waist Circumference 45.5 inches    Hip Circumference 50.5 inches    Waist to Hip Ratio 0.9 %    Triceps Skinfold 42 mm    % Body Fat 49.1 %    Grip Strength 20 kg    Flexibility 0 in   not done due to low back issues   Single Leg Stand 1.5 seconds              Nutrition Therapy Plan and Nutrition Goals:  Nutrition Therapy & Goals - 11/16/22 1626       Nutrition Therapy   Diet Heart healthy diet    Drug/Food Interactions Statins/Certain Fruits      Personal Nutrition Goals   Nutrition Goal Patient to identify strategies for reducing cardiovascular risk by attending the [employer] education and nutrition series weekly.    Personal Goal #2 Patient to improve diet quality by using the plate method as a guide for  meal planning to include lean protein/plant protein, fruits, vegetables, whole grains, nonfat dairy as part of a well-balanced diet.    Personal Goal #3 Patient to reduce sodium intake to 1500mg  per day    Comments Goals in action. Dnylah continues to attend the [employer] education and nutrition series regularly. She has started making many dietary changes including reduced sodium, increased dietary fiber, and decreased saturated fat intake. Patient will benefit from participation in intensive cardiac rehab for nutrition, exercise, and lifestyle modification.      Intervention Plan   Intervention Prescribe, educate and counsel regarding individualized specific dietary modifications aiming towards targeted core components such as weight, hypertension,  lipid management, diabetes, heart failure and other comorbidities.;Nutrition handout(s) given to patient.    Expected Outcomes Short Term Goal: Understand basic principles of dietary content, such as calories, fat, sodium, cholesterol and nutrients.;Long Term Goal: Adherence to prescribed nutrition plan.             Nutrition Assessments:  MEDIFICTS Score Key: ?70 Need to make dietary changes  40-70 Heart Healthy Diet ? 40 Therapeutic Level Cholesterol Diet    Picture Your Plate Scores: <78 Unhealthy dietary pattern with much room for improvement. 41-50 Dietary pattern unlikely to meet recommendations for good health and room for improvement. 51-60 More healthful dietary pattern, with some room for improvement.  >60 Healthy dietary pattern, although there may be some specific behaviors that could be improved.    Nutrition Goals Re-Evaluation:  Nutrition Goals Re-Evaluation     Row Name 10/23/22 1516 11/16/22 1626           Goals   Current Weight 211 lb 3.2 oz (95.8 kg) 212 lb 11.9 oz (96.5 kg)      Comment lipids WNL- HDL 34, LDL 84 (goal is <55), A1c 6.1, lipoproteinA 200 no new labs; most recent labs lipids WNL- HDL 34, LDL 84 (goal is <55), A1c 6.1, lipoproteinA 200      Expected Outcome Patient will benefit from participation in intensive cardiac rehab for nutrition, exercise, and lifestyle modification. Goals in action. Zaylah continues to attend the [facility] education and nutrition series regularly. She has started making many dietary changes including reduced sodium, increased dietary fiber, and decreased saturated fat intake. Patient will benefit from participation in intensive cardiac rehab for nutrition, exercise, and lifestyle modification.               Nutrition Goals Re-Evaluation:  Nutrition Goals Re-Evaluation     Row Name 10/23/22 1516 11/16/22 1626           Goals   Current Weight 211 lb 3.2 oz (95.8 kg) 212 lb 11.9 oz (96.5 kg)      Comment  lipids WNL- HDL 34, LDL 84 (goal is <55), A1c 6.1, lipoproteinA 200 no new labs; most recent labs lipids WNL- HDL 34, LDL 84 (goal is <55), A1c 6.1, lipoproteinA 200      Expected Outcome Patient will benefit from participation in intensive cardiac rehab for nutrition, exercise, and lifestyle modification. Goals in action. Baljinder continues to attend the [facility] education and nutrition series regularly. She has started making many dietary changes including reduced sodium, increased dietary fiber, and decreased saturated fat intake. Patient will benefit from participation in intensive cardiac rehab for nutrition, exercise, and lifestyle modification.               Nutrition Goals Discharge (Final Nutrition Goals Re-Evaluation):  Nutrition Goals Re-Evaluation - 11/16/22 1626  Goals   Current Weight 212 lb 11.9 oz (96.5 kg)    Comment no new labs; most recent labs lipids WNL- HDL 34, LDL 84 (goal is <55), A1c 6.1, lipoproteinA 200    Expected Outcome Goals in action. Zofia continues to attend the [facility] education and nutrition series regularly. She has started making many dietary changes including reduced sodium, increased dietary fiber, and decreased saturated fat intake. Patient will benefit from participation in intensive cardiac rehab for nutrition, exercise, and lifestyle modification.             Psychosocial: Target Goals: Acknowledge presence or absence of significant depression and/or stress, maximize coping skills, provide positive support system. Participant is able to verbalize types and ability to use techniques and skills needed for reducing stress and depression.  Initial Review & Psychosocial Screening:  Initial Psych Review & Screening - 10/12/22 1112       Initial Review   Current issues with Current Stress Concerns    Source of Stress Concerns Chronic Illness;Retirement/disability    Comments Keyari stated that she is not currently depressed or anxious,  however does have some slight feelings of stress related to her health and work. Jalah shared that her mother passed away from sudden cardiac arrest and that her own event caused a lot of fear due to this. Hendrix also shared that she has been on disability for around 20 years. She is very motivated for cardiac rehab and has a positive mindset entering the program.      Family Dynamics   Good Support System? Yes   Aljawhara has her sons and large family for support     Barriers   Psychosocial barriers to participate in program The patient should benefit from training in stress management and relaxation.      Screening Interventions   Interventions Encouraged to exercise;Provide feedback about the scores to participant;To provide support and resources with identified psychosocial needs    Expected Outcomes Long Term Goal: Stressors or current issues are controlled or eliminated.;Short Term goal: Identification and review with participant of any Quality of Life or Depression concerns found by scoring the questionnaire.;Long Term goal: The participant improves quality of Life and PHQ9 Scores as seen by post scores and/or verbalization of changes;Short Term goal: Utilizing psychosocial counselor, staff and physician to assist with identification of specific Stressors or current issues interfering with healing process. Setting desired goal for each stressor or current issue identified.             Quality of Life Scores:  Quality of Life - 10/12/22 1354       Quality of Life   Select Quality of Life      Quality of Life Scores   Health/Function Pre 23.6 %    Socioeconomic Pre 25.17 %    Psych/Spiritual Pre 24.86 %    Family Pre 28.5 %    GLOBAL Pre 24.78 %            Scores of 19 and below usually indicate a poorer quality of life in these areas.  A difference of  2-3 points is a clinically meaningful difference.  A difference of 2-3 points in the total score of the Quality of Life Index  has been associated with significant improvement in overall quality of life, self-image, physical symptoms, and general health in studies assessing change in quality of life.  PHQ-9: Review Flowsheet       10/12/2022 03/29/2019  Depression screen PHQ 2/9  Decreased  Interest 0 0  Down, Depressed, Hopeless 0 0  PHQ - 2 Score 0 0  Altered sleeping 0 -  Tired, decreased energy 1 -  Change in appetite 0 -  Feeling bad or failure about yourself  0 -  Trouble concentrating 0 -  Moving slowly or fidgety/restless 0 -  Suicidal thoughts 0 -  PHQ-9 Score 1 -  Difficult doing work/chores Not difficult at all -   Interpretation of Total Score  Total Score Depression Severity:  1-4 = Minimal depression, 5-9 = Mild depression, 10-14 = Moderate depression, 15-19 = Moderately severe depression, 20-27 = Severe depression   Psychosocial Evaluation and Intervention:   Psychosocial Re-Evaluation:  Psychosocial Re-Evaluation     Row Name 10/22/22 1614 11/19/22 1111           Psychosocial Re-Evaluation   Current issues with Current Stress Concerns Current Stress Concerns      Comments Blaikley was tearful on her first day of exercise as she told me she misses her mother who passed away in December 15, 2013 and lost 2 sisters on in December 15, 2020 the other in 12-16-2010. Emotional support provided Eilyn was tearful as reporting right sided chest pain on 11/19/22.  Rickeya says that this is all new to her. Emotional support provided      Expected Outcomes Vaniah will have decreased or contrilled  concerns or stressors upon compleiton of intensive cardiac rehab Sheril will have decreased or contrilled  concerns or stressors upon compleiton of intensive cardiac rehab      Interventions Stress management education;Relaxation education;Encouraged to attend Cardiac Rehabilitation for the exercise Stress management education;Relaxation education;Encouraged to attend Cardiac Rehabilitation for the exercise      Continue Psychosocial  Services  Follow up required by staff Follow up required by staff        Initial Review   Source of Stress Concerns Family;Retirement/disability Family;Retirement/disability      Comments Will continue to monitor and offer support as needed Will continue to monitor and offer support as needed               Psychosocial Discharge (Final Psychosocial Re-Evaluation):  Psychosocial Re-Evaluation - 11/19/22 12/15/1109       Psychosocial Re-Evaluation   Current issues with Current Stress Concerns    Comments Tatem was tearful as reporting right sided chest pain on 11/19/22.  Timeko says that this is all new to her. Emotional support provided    Expected Outcomes Yentl will have decreased or contrilled  concerns or stressors upon compleiton of intensive cardiac rehab    Interventions Stress management education;Relaxation education;Encouraged to attend Cardiac Rehabilitation for the exercise    Continue Psychosocial Services  Follow up required by staff      Initial Review   Source of Stress Concerns Family;Retirement/disability    Comments Will continue to monitor and offer support as needed             Vocational Rehabilitation: Provide vocational rehab assistance to qualifying candidates.   Vocational Rehab Evaluation & Intervention:  Vocational Rehab - 10/12/22 1355       Initial Vocational Rehab Evaluation & Intervention   Assessment shows need for Vocational Rehabilitation No   Mali is not interested in vocational rehab at this time            Education: Education Goals: Education classes will be provided on a weekly basis, covering required topics. Participant will state understanding/return demonstration of topics presented.    Education  Row Name 10/21/22 1500     Education   Cardiac Education Topics Pritikin   Orthoptist   Educator Dietitian   Weekly Topic Adding Flavor - Sodium-Free   Instruction Review Code 1-  Verbalizes Understanding   Class Start Time 1400   Class Stop Time 1445   Class Time Calculation (min) 45 min    Row Name 10/23/22 1600     Education   Cardiac Education Topics Pritikin   Engineer, mining Education   General Education Heart Disease Risk Reduction   Instruction Review Code 1- Verbalizes Understanding   Class Start Time 1405   Class Stop Time 1450   Class Time Calculation (min) 45 min    Row Name 10/26/22 1600     Education   Cardiac Education Topics Pritikin   Select Workshops     Workshops   Educator Exercise Physiologist   Select Exercise   Exercise Workshop Location manager and Fall Prevention   Instruction Review Code 1- Verbalizes Understanding   Class Start Time 1405   Class Stop Time 1445   Class Time Calculation (min) 40 min    Row Name 10/28/22 1500     Education   Cardiac Education Topics Pritikin   Customer service manager   Weekly Topic Fast and Healthy Breakfasts   Instruction Review Code 1- Verbalizes Understanding   Class Start Time 1400   Class Stop Time 1440   Class Time Calculation (min) 40 min    Row Name 10/30/22 1500     Education   Cardiac Education Topics Pritikin   Licensed conveyancer Nutrition   Nutrition Overview of the Pritikin Eating Plan   Instruction Review Code 1- Verbalizes Understanding   Class Start Time 1400   Class Stop Time 1450   Class Time Calculation (min) 50 min    Row Name 11/04/22 1500     Education   Cardiac Education Topics Pritikin   Orthoptist   Educator Dietitian   Weekly Topic Personalizing Your Pritikin Plate   Instruction Review Code 1- Verbalizes Understanding   Class Start Time 1355   Class Stop Time 1437   Class Time Calculation (min) 42 min    Row Name 11/06/22 1500     Education   Cardiac Education Topics  Pritikin   Nurse, children's Exercise Physiologist   Select Psychosocial   Psychosocial Healthy Minds, Bodies, Hearts   Instruction Review Code 1- Verbalizes Understanding   Class Start Time 1357   Class Stop Time 1429   Class Time Calculation (min) 32 min    Row Name 11/09/22 1500     Education   Cardiac Education Topics Pritikin   Glass blower/designer Nutrition   Nutrition Workshop Label Reading   Instruction Review Code 1- Tax inspector   Class Start Time 1400   Class Stop Time 1445   Class Time Calculation (min) 45 min    Row Name 11/11/22 1500     Education   Cardiac Education Topics Pritikin   Multimedia programmer [employer]  Educator Dietitian   Weekly Topic Delicious Desserts   Instruction Review Code 1- Verbalizes Understanding   Class Start Time 1400   Class Stop Time 1450   Class Time Calculation (min) 50 min    Row Name 11/13/22 1500     Education   Cardiac Education Topics Pritikin   Licensed conveyancer Nutrition   Nutrition Other  label reading   Instruction Review Code 1- Verbalizes Understanding   Class Start Time 1400   Class Stop Time 1440   Class Time Calculation (min) 40 min    Row Name 11/16/22 1500     Education   Cardiac Education Topics Pritikin   Immunologist Exercise Physiologist   Select Exercise   Exercise Workshop Exercise Basics: Building Your Action Plan   Instruction Review Code 1- Verbalizes Understanding   Class Start Time 1404   Class Stop Time 1448   Class Time Calculation (min) 44 min    Row Name 11/18/22 1147     Education   Cardiac Education Topics Pritikin   Customer service manager   Weekly Topic Tasty Appetizers and Snacks   Instruction Review Code 1- Verbalizes Understanding   Class Start Time 1400    Class Stop Time 1440   Class Time Calculation (min) 40 min    Row Name 11/20/22 1400     Education   Cardiac Education Topics Pritikin   Nurse, children's Exercise Physiologist  NB   Select Nutrition   Nutrition Nutrition Action Plan   Instruction Review Code 1- Verbalizes Understanding   Class Start Time 1400   Class Stop Time 1440   Class Time Calculation (min) 40 min            Core Videos: Exercise    Move It!  Clinical staff conducted group or individual video education with verbal and written material and guidebook.  Patient learns the recommended Pritikin exercise program. Exercise with the goal of living a long, healthy life. Some of the health benefits of exercise include controlled diabetes, healthier blood pressure levels, improved cholesterol levels, improved heart and lung capacity, improved sleep, and better body composition. Everyone should speak with their doctor before starting or changing an exercise routine.  Biomechanical Limitations Clinical staff conducted group or individual video education with verbal and written material and guidebook.  Patient learns how biomechanical limitations can impact exercise and how we can mitigate and possibly overcome limitations to have an impactful and balanced exercise routine.  Body Composition Clinical staff conducted group or individual video education with verbal and written material and guidebook.  Patient learns that body composition (ratio of muscle mass to fat mass) is a key component to assessing overall fitness, rather than body weight alone. Increased fat mass, especially visceral belly fat, can put [place] at increased risk for metabolic syndrome, type 2 diabetes, heart disease, and even death. It is recommended to combine diet and exercise (cardiovascular and resistance training) to improve your body composition. Seek guidance from your physician and exercise physiologist before  implementing an exercise routine.  Exercise Action Plan Clinical staff conducted group or individual video education with verbal and written material and guidebook.  Patient learns the recommended strategies to achieve and enjoy long-term exercise adherence, including variety, self-motivation, self-efficacy, and  positive decision making. Benefits of exercise include fitness, good health, weight management, more energy, better sleep, less stress, and overall well-being.  Medical   Heart Disease Risk Reduction Clinical staff conducted group or individual video education with verbal and written material and guidebook.  Patient learns our heart is our most vital organ as it circulates oxygen, nutrients, white blood cells, and hormones throughout the entire body, and carries waste away. Data supports a plant-based eating plan like the Pritikin Program for its effectiveness in slowing progression of and reversing heart disease. The video provides a number of recommendations to address heart disease.   Metabolic Syndrome and Belly Fat  Clinical staff conducted group or individual video education with verbal and written material and guidebook.  Patient learns what metabolic syndrome is, how it leads to heart disease, and how one can reverse it and keep it from coming back. You have metabolic syndrome if you have 3 of the following 5 criteria: abdominal obesity, high blood pressure, high triglycerides, low HDL cholesterol, and high blood sugar.  Hypertension and Heart Disease Clinical staff conducted group or individual video education with verbal and written material and guidebook.  Patient learns that high blood pressure, or hypertension, is very common in the [place]. Hypertension is largely due to excessive salt intake, but other important risk factors include being overweight, physical inactivity, drinking too much alcohol, smoking, and not eating enough potassium from fruits and vegetables. High  blood pressure is a leading risk factor for heart attack, stroke, congestive heart failure, dementia, kidney failure, and premature death. Long-term effects of excessive salt intake include stiffening of the arteries and thickening of heart muscle and organ damage. Recommendations include ways to reduce hypertension and the risk of heart disease.  Diseases of Our Time - Focusing on Diabetes Clinical staff conducted group or individual video education with verbal and written material and guidebook.  Patient learns why the best way to stop diseases of our time is prevention, through food and other lifestyle changes. Medicine (such as prescription pills and surgeries) is often only a Band-Aid on the problem, not a long-term solution. Most common diseases of our time include obesity, type 2 diabetes, hypertension, heart disease, and cancer. The Pritikin Program is recommended and has been proven to help reduce, reverse, and/or prevent the damaging effects of metabolic syndrome.  Nutrition   Overview of the Pritikin Eating Plan  Clinical staff conducted group or individual video education with verbal and written material and guidebook.  Patient learns about the Pritikin Eating Plan for disease risk reduction. The Pritikin Eating Plan emphasizes a wide variety of unrefined, minimally-processed carbohydrates, like fruits, vegetables, whole grains, and legumes. Go, Caution, and Stop food choices are explained. Plant-based and lean animal proteins are emphasized. Rationale provided for low sodium intake for blood pressure control, low added sugars for blood sugar stabilization, and low added fats and oils for coronary artery disease risk reduction and weight management.  Calorie Density  Clinical staff conducted group or individual video education with verbal and written material and guidebook.  Patient learns about calorie density and how it impacts the Pritikin Eating Plan. Knowing the characteristics of the  food you choose will help you decide whether those foods will lead to weight gain or weight loss, and whether you want to consume more or less of them. Weight loss is usually a side effect of the Pritikin Eating Plan because of its focus on low calorie-dense foods.  Label Reading  Clinical staff conducted  group or individual video education with verbal and written material and guidebook.  Patient learns about the Pritikin recommended label reading guidelines and corresponding recommendations regarding calorie density, added sugars, sodium content, and whole grains.  Dining Out - Part 1  Clinical staff conducted group or individual video education with verbal and written material and guidebook.  Patient learns that restaurant meals can be sabotaging because they can be so high in calories, fat, sodium, and/or sugar. Patient learns recommended strategies on how to positively address this and avoid unhealthy pitfalls.  Facts on Fats  Clinical staff conducted group or individual video education with verbal and written material and guidebook.  Patient learns that lifestyle modifications can be just as effective, if not more so, as many medications for lowering your risk of heart disease. A Pritikin lifestyle can help to reduce your risk of inflammation and atherosclerosis (cholesterol build-up, or plaque, in the artery walls). Lifestyle interventions such as dietary choices and physical activity address the cause of atherosclerosis. A review of the types of fats and their impact on blood cholesterol levels, along with dietary recommendations to reduce fat intake is also included.  Nutrition Action Plan  Clinical staff conducted group or individual video education with verbal and written material and guidebook.  Patient learns how to incorporate Pritikin recommendations into their lifestyle. Recommendations include planning and keeping personal health goals in mind as an important part of their  success.  Healthy Mind-Set    Healthy Minds, Bodies, Hearts  Clinical staff conducted group or individual video education with verbal and written material and guidebook.  Patient learns how to identify when they are stressed. Video will discuss the impact of that stress, as well as the many benefits of stress management. Patient will also be introduced to stress management techniques. The way we think, act, and feel has an impact on our hearts.  How Our Thoughts Can Heal Our Hearts  Clinical staff conducted group or individual video education with verbal and written material and guidebook.  Patient learns that negative thoughts can cause depression and anxiety. This can result in negative lifestyle behavior and serious health problems. Cognitive behavioral therapy is an effective method to help control our thoughts in order to change and improve our emotional outlook.  Additional Videos:  Exercise    Improving Performance  Clinical staff conducted group or individual video education with verbal and written material and guidebook.  Patient learns to use a non-linear approach by alternating intensity levels and lengths of time spent exercising to help burn more calories and lose more body fat. Cardiovascular exercise helps improve heart health, metabolism, hormonal balance, blood sugar control, and recovery from fatigue. Resistance training improves strength, endurance, balance, coordination, reaction time, metabolism, and muscle mass. Flexibility exercise improves circulation, posture, and balance. Seek guidance from your physician and exercise physiologist before implementing an exercise routine and learn your capabilities and proper form for all exercise.  Introduction to Yoga  Clinical staff conducted group or individual video education with verbal and written material and guidebook.  Patient learns about yoga, a discipline of the coming together of mind, breath, and body. The benefits of yoga  include improved flexibility, improved range of motion, better posture and core strength, increased lung function, weight loss, and positive self-image. Yoga's heart health benefits include lowered blood pressure, healthier heart rate, decreased cholesterol and triglyceride levels, improved immune function, and reduced stress. Seek guidance from your physician and exercise physiologist before implementing an exercise routine and learn your  capabilities and proper form for all exercise.  Medical   Aging: Enhancing Your Quality of Life  Clinical staff conducted group or individual video education with verbal and written material and guidebook.  Patient learns key strategies and recommendations to stay in good physical health and enhance quality of life, such as prevention strategies, having an advocate, securing a Health Care Proxy and Power of Attorney, and keeping a list of medications and system for tracking them. It also discusses how to avoid risk for bone loss.  Biology of Weight Control  Clinical staff conducted group or individual video education with verbal and written material and guidebook.  Patient learns that weight gain occurs because we consume more calories than we burn (eating more, moving less). Even if your body weight is normal, you may have higher ratios of fat compared to muscle mass. Too much body fat puts you at increased risk for cardiovascular disease, heart attack, stroke, type 2 diabetes, and obesity-related cancers. In addition to exercise, following the Pritikin Eating Plan can help reduce your risk.  Decoding Lab Results  Clinical staff conducted group or individual video education with verbal and written material and guidebook.  Patient learns that lab test reflects one measurement whose values change over time and are influenced by many factors, including medication, stress, sleep, exercise, food, hydration, pre-existing medical conditions, and more. It is recommended to  use the knowledge from this video to become more involved with your lab results and evaluate your numbers to speak with your doctor.   Diseases of Our Time - Overview  Clinical staff conducted group or individual video education with verbal and written material and guidebook.  Patient learns that according to the CDC, 50% to 70% of chronic diseases (such as obesity, type 2 diabetes, elevated lipids, hypertension, and heart disease) are avoidable through lifestyle improvements including healthier food choices, listening to satiety cues, and increased physical activity.  Sleep Disorders Clinical staff conducted group or individual video education with verbal and written material and guidebook.  Patient learns how good quality and duration of sleep are important to overall health and well-being. Patient also learns about sleep disorders and how they impact health along with recommendations to address them, including discussing with a physician.  Nutrition  Dining Out - Part 2 Clinical staff conducted group or individual video education with verbal and written material and guidebook.  Patient learns how to plan ahead and communicate in order to maximize their dining experience in a healthy and nutritious manner. Included are recommended food choices based on the type of restaurant the patient is visiting.   Fueling a Banker conducted group or individual video education with verbal and written material and guidebook.  There is a strong connection between our food choices and our health. Diseases like obesity and type 2 diabetes are very prevalent and are in large-part due to lifestyle choices. The Pritikin Eating Plan provides plenty of food and hunger-curbing satisfaction. It is easy to follow, affordable, and helps reduce health risks.  Menu Workshop  Clinical staff conducted group or individual video education with verbal and written material and guidebook.  Patient learns  that restaurant meals can sabotage health goals because they are often packed with calories, fat, sodium, and sugar. Recommendations include strategies to plan ahead and to communicate with the manager, chef, or server to help order a healthier meal.  Planning Your Eating Strategy  Clinical staff conducted group or individual video education with verbal and written  material and guidebook.  Patient learns about the Pritikin Eating Plan and its benefit of reducing the risk of disease. The Pritikin Eating Plan does not focus on calories. Instead, it emphasizes high-quality, nutrient-rich foods. By knowing the characteristics of the foods, we choose, we can determine their calorie density and make informed decisions.  Targeting Your Nutrition Priorities  Clinical staff conducted group or individual video education with verbal and written material and guidebook.  Patient learns that lifestyle habits have a tremendous impact on disease risk and progression. This video provides eating and physical activity recommendations based on your personal health goals, such as reducing LDL cholesterol, losing weight, preventing or controlling type 2 diabetes, and reducing high blood pressure.  Vitamins and Minerals  Clinical staff conducted group or individual video education with verbal and written material and guidebook.  Patient learns different ways to obtain key vitamins and minerals, including through a recommended healthy diet. It is important to discuss all supplements you take with your doctor.   Healthy Mind-Set    Smoking Cessation  Clinical staff conducted group or individual video education with verbal and written material and guidebook.  Patient learns that cigarette smoking and tobacco addiction pose a serious health risk which affects millions of people. Stopping smoking will significantly reduce the risk of heart disease, lung disease, and many forms of cancer. Recommended strategies for quitting  are covered, including working with your doctor to develop a successful plan.  Culinary   Becoming a Set designer conducted group or individual video education with verbal and written material and guidebook.  Patient learns that cooking at home can be healthy, cost-effective, quick, and puts them in control. Keys to cooking healthy recipes will include looking at your recipe, assessing your equipment needs, planning ahead, making it simple, choosing cost-effective seasonal ingredients, and limiting the use of added fats, salts, and sugars.  Cooking - Breakfast and Snacks  Clinical staff conducted group or individual video education with verbal and written material and guidebook.  Patient learns how important breakfast is to satiety and nutrition through the entire day. Recommendations include key foods to eat during breakfast to help stabilize blood sugar levels and to prevent overeating at meals later in the day. Planning ahead is also a key component.  Cooking - Educational psychologist conducted group or individual video education with verbal and written material and guidebook.  Patient learns eating strategies to improve overall health, including an approach to cook more at home. Recommendations include thinking of animal protein as a side on your plate rather than center stage and focusing instead on lower calorie dense options like vegetables, fruits, whole grains, and plant-based proteins, such as beans. Making sauces in large quantities to freeze for later and leaving the skin on your vegetables are also recommended to maximize your experience.  Cooking - Healthy Salads and Dressing Clinical staff conducted group or individual video education with verbal and written material and guidebook.  Patient learns that vegetables, fruits, whole grains, and legumes are the foundations of the Pritikin Eating Plan. Recommendations include how to incorporate each of these in  flavorful and healthy salads, and how to create homemade salad dressings. Proper handling of ingredients is also covered. Cooking - Soups and [employer] - Soups and Desserts Clinical staff conducted group or individual video education with verbal and written material and guidebook.  Patient learns that Pritikin soups and desserts make for easy, nutritious, and delicious snacks and meal  components that are low in sodium, fat, sugar, and calorie density, while high in vitamins, minerals, and filling fiber. Recommendations include simple and healthy ideas for soups and desserts.   Overview     The Pritikin Solution Program Overview Clinical staff conducted group or individual video education with verbal and written material and guidebook.  Patient learns that the results of the Pritikin Program have been documented in more than 100 articles published in peer-reviewed journals, and the benefits include reducing risk factors for (and, in some cases, even reversing) high cholesterol, high blood pressure, type 2 diabetes, obesity, and more! An overview of the three key pillars of the Pritikin Program will be covered: eating well, doing regular exercise, and having a healthy mind-set.  WORKSHOPS  Exercise: Exercise Basics: Building Your Action Plan Clinical staff led group instruction and group discussion with PowerPoint presentation and patient guidebook. To enhance the learning environment the use of posters, models and videos may be added. At the conclusion of this workshop, patients will comprehend the difference between physical activity and exercise, as well as the benefits of incorporating both, into their routine. Patients will understand the FITT (Frequency, Intensity, Time, and Type) principle and how to use it to build an exercise action plan. In addition, safety concerns and other considerations for exercise and cardiac rehab will be addressed by the presenter. The purpose of this  lesson is to promote a comprehensive and effective weekly exercise routine in order to improve patients' overall level of fitness.   Managing Heart Disease: Your Path to a Healthier Heart Clinical staff led group instruction and group discussion with PowerPoint presentation and patient guidebook. To enhance the learning environment the use of posters, models and videos may be added.At the conclusion of this workshop, patients will understand the anatomy and physiology of the heart. Additionally, they will understand how Pritikin's three pillars impact the risk factors, the progression, and the management of heart disease.  The purpose of this lesson is to provide a high-level overview of the heart, heart disease, and how the Pritikin lifestyle positively impacts risk factors.  Exercise Biomechanics Clinical staff led group instruction and group discussion with PowerPoint presentation and patient guidebook. To enhance the learning environment the use of posters, models and videos may be added. Patients will learn how the structural parts of their bodies function and how these functions impact their daily activities, movement, and exercise. Patients will learn how to promote a neutral spine, learn how to manage pain, and identify ways to improve their physical movement in order to promote healthy living. The purpose of this lesson is to expose patients to common physical limitations that impact physical activity. Participants will learn practical ways to adapt and manage aches and pains, and to minimize their effect on regular exercise. Patients will learn how to maintain good posture while sitting, walking, and lifting.  Balance Training and Fall Prevention  Clinical staff led group instruction and group discussion with PowerPoint presentation and patient guidebook. To enhance the learning environment the use of posters, models and videos may be added. At the conclusion of this workshop,  patients will understand the importance of their sensorimotor skills (vision, proprioception, and the vestibular system) in maintaining their ability to balance as they age. Patients will apply a variety of balancing exercises that are appropriate for their current level of function. Patients will understand the common causes for poor balance, possible solutions to these problems, and ways to modify their physical environment in order to minimize  their fall risk. The purpose of this lesson is to teach patients about the importance of maintaining balance as they age and ways to minimize their risk of falling.  WORKSHOPS   Nutrition:  Fueling a Ship broker led group instruction and group discussion with PowerPoint presentation and patient guidebook. To enhance the learning environment the use of posters, models and videos may be added. Patients will review the foundational principles of the Pritikin Eating Plan and understand what constitutes a serving size in each of the food groups. Patients will also learn Pritikin-friendly foods that are better choices when away from home and review make-ahead meal and snack options. Calorie density will be reviewed and applied to three nutrition priorities: weight maintenance, weight loss, and weight gain. The purpose of this lesson is to reinforce (in a group setting) the key concepts around what patients are recommended to eat and how to apply these guidelines when away from home by planning and selecting Pritikin-friendly options. Patients will understand how calorie density may be adjusted for different weight management goals.  Mindful Eating  Clinical staff led group instruction and group discussion with PowerPoint presentation and patient guidebook. To enhance the learning environment the use of posters, models and videos may be added. Patients will briefly review the concepts of the Pritikin Eating Plan and the importance of low-calorie dense  foods. The concept of mindful eating will be introduced as well as the importance of paying attention to internal hunger signals. Triggers for non-hunger eating and techniques for dealing with triggers will be explored. The purpose of this lesson is to provide patients with the opportunity to review the basic principles of the Pritikin Eating Plan, discuss the value of eating mindfully and how to measure internal cues of hunger and fullness using the Hunger Scale. Patients will also discuss reasons for non-hunger eating and learn strategies to use for controlling emotional eating.  Targeting Your Nutrition Priorities Clinical staff led group instruction and group discussion with PowerPoint presentation and patient guidebook. To enhance the learning environment the use of posters, models and videos may be added. Patients will learn how to determine their genetic susceptibility to disease by reviewing their family history. Patients will gain insight into the importance of diet as part of an overall healthy lifestyle in mitigating the impact of genetics and other environmental insults. The purpose of this lesson is to provide patients with the opportunity to assess their personal nutrition priorities by looking at their family history, their own health history and current risk factors. Patients will also be able to discuss ways of prioritizing and modifying the Pritikin Eating Plan for their highest risk areas  Menu  Clinical staff led group instruction and group discussion with PowerPoint presentation and patient guidebook. To enhance the learning environment the use of posters, models and videos may be added. Using menus brought in from [employer], or printed from [employer], patients will apply the Pritikin dining out guidelines that were presented in the [employer] video. Patients will also be able to practice these guidelines in a variety of provided scenarios. The purpose of  this lesson is to provide patients with the opportunity to practice hands-on learning of the Pritikin Dining Out guidelines with actual menus and practice scenarios.  Label Reading Clinical staff led group instruction and group discussion with PowerPoint presentation and patient guidebook. To enhance the learning environment the use of posters, models and videos may be added. Patients will review and discuss the  Pritikin label reading guidelines presented in Pritikin's Label Reading Educational series video. Using fool labels brought in from local grocery stores and markets, patients will apply the label reading guidelines and determine if the packaged food meet the Pritikin guidelines. The purpose of this lesson is to provide patients with the opportunity to review, discuss, and practice hands-on learning of the Pritikin Label Reading guidelines with actual packaged food labels. Cooking School  Pritikin's [employer] are designed to teach patients ways to prepare quick, simple, and affordable recipes at home. The importance of nutrition's role in chronic disease risk reduction is reflected in its emphasis in the overall Pritikin program. By learning how to prepare essential core Pritikin Eating Plan recipes, patients will increase control over what they eat; be able to customize the flavor of foods without the use of added salt, sugar, or fat; and improve the quality of the food they consume. By learning a set of core recipes which are easily assembled, quickly prepared, and affordable, patients are more likely to prepare more healthy foods at home. These workshops focus on convenient breakfasts, simple entres, side dishes, and desserts which can be prepared with minimal effort and are consistent with nutrition recommendations for cardiovascular risk reduction. Cooking [employer] are taught by a Armed forces logistics/support/administrative officer (RD) who has been trained by the [employer]. The  chef or RD has a clear understanding of the importance of minimizing - if not completely eliminating - added fat, sugar, and sodium in recipes. Throughout the series of Cooking School Workshop sessions, patients will learn about healthy ingredients and efficient methods of cooking to build confidence in their capability to prepare    Cooking School weekly topics:  Adding Flavor- Sodium-Free  Fast and Healthy Breakfasts  Powerhouse Plant-Based Proteins  Satisfying Salads and Dressings  Simple Sides and Sauces  International Cuisine-Spotlight on the [employer] Zones  Delicious Desserts  Savory Soups  [employer] - Meals in a Astronomer Appetizers and Snacks  Comforting Weekend Breakfasts  One-Pot Wonders   Fast Evening Meals  Landscape architect Your Pritikin Plate  WORKSHOPS   Healthy Mindset (Psychosocial):  Focused Goals, Sustainable Changes Clinical staff led group instruction and group discussion with PowerPoint presentation and patient guidebook. To enhance the learning environment the use of posters, models and videos may be added. Patients will be able to apply effective goal setting strategies to establish at least one personal goal, and then take consistent, meaningful action toward that goal. They will learn to identify common barriers to achieving personal goals and develop strategies to overcome them. Patients will also gain an understanding of how our mind-set can impact our ability to achieve goals and the importance of cultivating a positive and growth-oriented mind-set. The purpose of this lesson is to provide patients with a deeper understanding of how to set and achieve personal goals, as well as the tools and strategies needed to overcome common obstacles which may arise along the way.  From Head to Heart: The Power of a Healthy Outlook  Clinical staff led group instruction and group discussion with PowerPoint presentation and patient guidebook. To  enhance the learning environment the use of posters, models and videos may be added. Patients will be able to recognize and describe the impact of emotions and mood on physical health. They will discover the importance of self-care and explore self-care practices which may work for them. Patients will also learn how to utilize the 4 C's to  cultivate a healthier outlook and better manage stress and challenges. The purpose of this lesson is to demonstrate to patients how a healthy outlook is an essential part of maintaining good health, especially as they continue their cardiac rehab journey.  Healthy Sleep for a Healthy Heart Clinical staff led group instruction and group discussion with PowerPoint presentation and patient guidebook. To enhance the learning environment the use of posters, models and videos may be added. At the conclusion of this workshop, patients will be able to demonstrate knowledge of the importance of sleep to overall health, well-being, and quality of life. They will understand the symptoms of, and treatments for, common sleep disorders. Patients will also be able to identify daytime and nighttime behaviors which impact sleep, and they will be able to apply these tools to help manage sleep-related challenges. The purpose of this lesson is to provide patients with a general overview of sleep and outline the importance of quality sleep. Patients will learn about a few of the most common sleep disorders. Patients will also be introduced to the concept of "sleep hygiene," and discover ways to self-manage certain sleeping problems through simple daily behavior changes. Finally, the workshop will motivate patients by clarifying the links between quality sleep and their goals of heart-healthy living.   Recognizing and Reducing Stress Clinical staff led group instruction and group discussion with PowerPoint presentation and patient guidebook. To enhance the learning environment the use of posters,  models and videos may be added. At the conclusion of this workshop, patients will be able to understand the types of stress reactions, differentiate between acute and chronic stress, and recognize the impact that chronic stress has on their health. They will also be able to apply different coping mechanisms, such as reframing negative self-talk. Patients will have the opportunity to practice a variety of stress management techniques, such as deep abdominal breathing, progressive muscle relaxation, and/or guided imagery.  The purpose of this lesson is to educate patients on the role of stress in their lives and to provide healthy techniques for coping with it.  Learning Barriers/Preferences:  Learning Barriers/Preferences - 10/12/22 1354       Learning Barriers/Preferences   Learning Barriers Sight   wears glasses   Learning Preferences Audio;Computer/Internet;Group Instruction;Individual Instruction;Pictoral;Skilled Demonstration;Verbal Instruction;Video;Written Material             Education Topics:  Knowledge Questionnaire Score:  Knowledge Questionnaire Score - 10/12/22 1355       Knowledge Questionnaire Score   Pre Score 26/28             Core Components/Risk Factors/Patient Goals at Admission:  Personal Goals and Risk Factors at Admission - 10/12/22 1040       Core Components/Risk Factors/Patient Goals on Admission    Weight Management Yes;Obesity;Weight Loss    Intervention Weight Management: Develop a combined nutrition and exercise program designed to reach desired caloric intake, while maintaining appropriate intake of nutrient and fiber, sodium and fats, and appropriate energy expenditure required for the weight goal.;Weight Management: Provide education and appropriate resources to help participant work on and attain dietary goals.;Weight Management/Obesity: Establish reasonable short term and long term weight goals.;Obesity: Provide education and appropriate resources  to help participant work on and attain dietary goals.    Expected Outcomes Short Term: Continue to assess and modify interventions until short term weight is achieved;Long Term: Adherence to nutrition and physical activity/exercise program aimed toward attainment of established weight goal;Weight Loss: Understanding of general recommendations for a balanced deficit  meal plan, which promotes 1-2 lb weight loss per week and includes a negative energy balance of [phone] kcal/d;Understanding recommendations for meals to include 15-35% energy as protein, 25-35% energy from fat, 35-60% energy from carbohydrates, less than 200mg  of dietary cholesterol, 20-35 gm of total fiber daily;Understanding of distribution of calorie intake throughout the day with the consumption of 4-5 meals/snacks    Tobacco Cessation Yes    Number of packs per day 3 cig/ day    Intervention Assist the participant in steps to quit. Provide individualized education and counseling about committing to Tobacco Cessation, relapse prevention, and pharmacological support that can be provided by physician.;Education officer, environmental, assist with locating and accessing local/national Quit Smoking programs, and support quit date choice.    Expected Outcomes Short Term: Will demonstrate readiness to quit, by selecting a quit date.;Long Term: Complete abstinence from all tobacco products for at least 12 months from quit date.;Short Term: Will quit all tobacco product use, adhering to prevention of relapse plan.    Hypertension Yes    Intervention Provide education on lifestyle modifcations including regular physical activity/exercise, weight management, moderate sodium restriction and increased consumption of fresh fruit, vegetables, and low fat dairy, alcohol moderation, and smoking cessation.;Monitor prescription use compliance.    Expected Outcomes Long Term: Maintenance of blood pressure at goal levels.;Short Term: Continued assessment and  intervention until BP is < 140/37mm HG in hypertensive participants. < 130/3mm HG in hypertensive participants with diabetes, heart failure or chronic kidney disease.    Lipids Yes    Intervention Provide education and support for participant on nutrition & aerobic/resistive exercise along with prescribed medications to achieve LDL 70mg , HDL >40mg .    Expected Outcomes Short Term: Participant states understanding of desired cholesterol values and is compliant with medications prescribed. Participant is following exercise prescription and nutrition guidelines.;Long Term: Cholesterol controlled with medications as prescribed, with individualized exercise RX and with personalized nutrition plan. Value goals: LDL < 70mg , HDL > 40 mg.    Stress Yes    Intervention Offer individual and/or small group education and counseling on adjustment to heart disease, stress management and health-related lifestyle change. Teach and support self-help strategies.;Refer participants experiencing significant psychosocial distress to appropriate mental health specialists for further evaluation and treatment. When possible, include family members and significant others in education/counseling sessions.    Expected Outcomes Short Term: Participant demonstrates changes in health-related behavior, relaxation and other stress management skills, ability to obtain effective social support, and compliance with psychotropic medications if prescribed.;Long Term: Emotional wellbeing is indicated by absence of clinically significant psychosocial distress or social isolation.             Core Components/Risk Factors/Patient Goals Review:   Goals and Risk Factor Review     Row Name 11/19/22 1115             Core Components/Risk Factors/Patient Goals Review   Personal Goals Review Weight Management/Obesity;Hypertension;Tobacco Cessation;Lipids;Stress       Review Uriana has been doing well with exercise at intensive cardiac  rehab for his fitness level. Vital signs have been stable. Will continue to encourage smoking cessation       Expected Outcomes Damaiya will continue to participate in intensive cardiac rehab for exercise, nutrition and lifestyle modifications                Core Components/Risk Factors/Patient Goals at Discharge (Final Review):   Goals and Risk Factor Review - 11/19/22 1115       Core  Components/Risk Factors/Patient Goals Review   Personal Goals Review Weight Management/Obesity;Hypertension;Tobacco Cessation;Lipids;Stress    Review Telethia has been doing well with exercise at intensive cardiac rehab for his fitness level. Vital signs have been stable. Will continue to encourage smoking cessation    Expected Outcomes Katelind will continue to participate in intensive cardiac rehab for exercise, nutrition and lifestyle modifications             ITP Comments:  ITP Comments     Row Name 10/12/22 1037 10/22/22 1610 11/19/22 1109       ITP Comments Dr. Armanda Magic medical director. Introduction to pritikin education program/ intensive cardiac rehab. Initial orientation packet reviewed with patient. 30 Day ITP Review. Eli started intensive cardiac rehab on 10/21/22 and did well with exercise. 30 Day ITP Review. Dhvani has good attendance and participation in intensive cardiac rehab              Comments: See ITP comments. Exercise is currently on hold.Thayer Headings RN BSN

## 2022-11-20 ENCOUNTER — Telehealth (HOSPITAL_COMMUNITY): Payer: Self-pay

## 2022-11-20 ENCOUNTER — Encounter (HOSPITAL_COMMUNITY)
Admission: RE | Admit: 2022-11-20 | Discharge: 2022-11-20 | Disposition: A | Discharge status: Home / Self Care | Payer: 59 | Source: Ambulatory Visit | Attending: Cardiovascular Disease | Admitting: Cardiovascular Disease

## 2022-11-20 DIAGNOSIS — Z955 Presence of coronary angioplasty implant and graft: Secondary | ICD-10-CM

## 2022-11-20 DIAGNOSIS — I214 Non-ST elevation (NSTEMI) myocardial infarction: Secondary | ICD-10-CM | POA: Diagnosis not present

## 2022-11-20 DIAGNOSIS — R079 Chest pain, unspecified: Secondary | ICD-10-CM

## 2022-11-20 LAB — GLUCOSE, CAPILLARY: Glucose-Capillary: 94 mg/dL (ref 70–99)

## 2022-11-20 NOTE — Telephone Encounter (Signed)
LVM for [facility] regarding CP 2 days ago. Asked for a return call prior to coming to Cardiac Rehab class today at 1:45. Awaiting call back

## 2022-11-20 NOTE — Progress Notes (Addendum)
Incomplete Session Note  Patient Details  Name: [name] MRN: [id number] Date of Birth: 1966-03-22 Referring Provider:   Flowsheet Row INTENSIVE CARDIAC REHAB ORIENT from 10/12/2022 in [facility]  Referring Provider Verne Carrow, MD       1340 [name] arrived at Cardiac Rehab and screened for any ongoing CP since Wednesday. (See Wednesday 6/26 note) Yasmene stated she was having chest pain on Wednesday evening after leaving Cardiac Rehab. Pain 4/10. Throbbing pain in the center of her chest. Drank water, took regular medications, and rested. Valissa stated that she took some deep breaths and the pain subsided. She stated it was a dull pain, not with exertion but while she was laying in bed. The pain started on right anterior side and traveled to the middle of her chest. She stated she does have heart burn, takes protonix and ate a burger that night for dinner.   1400 Robin Searing, NP called to evaluate the patient. Cleared to exercise today with instructions to notify staff if having CP or unusual symptoms. Patient provided banana and peanut butter crackers as she did not eat anything prior to coming to class.   1430 Pt walked to education class. Will continue to monitor closely while exercising.     1515 After education, pt's pre-exercise VS WNL HR 72, NSR, 130/82.  Pt performed warm up exercises with no acute problems. Pt then started on the recumbent bike. Pt exercised for about 5 minutes when staff was alerted that pt c/o SOB.  Pt instructed and educated on purse lipped breathing. O2 sat. 99%. BP 118/86, NSR on tele HR 70. Pt then attempted to get up from bike to walk to chair. Pt got dizzy and assisted back onto the bike chair. Pt rested with supervision and then assisted to wheelchair and taken to treatment room.   Pt assessed, denies pain/CP, stated she feels "off". Stated she still felt a little SOB. VS 136/76,  O2 99%. Robin Searing, NP called to evaluate patient. Blood sugar checked 94, apple juice provided. After monitoring for about 10-15 minutes pt feeling better. Performed orthostatic VS from sit to stand 137/80 sitting, 124/72 standing.  NP instructed Katalea to hydrate and eat something before she comes to class. Also instructed to call Cardiology on Monday get get an earlier appointment. Reviewed instructions for nitroglycerin and when to call 911, pt understands without assistance. Pt called insurance ride and pushed down to main entrance in wheel chair. RN waited with pt until ride arrived. No distress.

## 2022-11-23 ENCOUNTER — Encounter (HOSPITAL_COMMUNITY): Payer: 59

## 2022-11-25 ENCOUNTER — Encounter (HOSPITAL_COMMUNITY): Payer: 59

## 2022-11-27 ENCOUNTER — Encounter (HOSPITAL_COMMUNITY): Payer: 59

## 2022-11-30 ENCOUNTER — Encounter (HOSPITAL_COMMUNITY): Payer: 59

## 2022-12-01 ENCOUNTER — Telehealth: Payer: Self-pay | Admitting: Nurse Practitioner

## 2022-12-01 ENCOUNTER — Ambulatory Visit: Payer: 59 | Attending: Cardiovascular Disease

## 2022-12-01 DIAGNOSIS — I2510 Atherosclerotic heart disease of native coronary artery without angina pectoris: Secondary | ICD-10-CM

## 2022-12-01 DIAGNOSIS — E785 Hyperlipidemia, unspecified: Secondary | ICD-10-CM

## 2022-12-01 NOTE — Telephone Encounter (Signed)
Patient came in today asking for a letter to be cleared to go back to cardiac rehab. Please call and advise patient.

## 2022-12-02 ENCOUNTER — Telehealth: Payer: Self-pay | Admitting: *Deleted

## 2022-12-02 ENCOUNTER — Encounter (HOSPITAL_COMMUNITY): Payer: 59

## 2022-12-02 ENCOUNTER — Telehealth (HOSPITAL_COMMUNITY): Payer: Self-pay | Admitting: *Deleted

## 2022-12-02 DIAGNOSIS — Z79899 Other long term (current) drug therapy: Secondary | ICD-10-CM

## 2022-12-02 LAB — COMPREHENSIVE METABOLIC PANEL
ALT: 16 IU/L (ref 0–32)
AST: 15 IU/L (ref 0–40)
Albumin: 3.9 g/dL (ref 3.8–4.9)
Alkaline Phosphatase: 95 IU/L (ref 44–121)
BUN/Creatinine Ratio: 16 (ref 9–23)
BUN: 15 mg/dL (ref 6–24)
Bilirubin Total: <0.2 mg/dL (ref 0.0–1.2)
CO2: 25 mmol/L (ref 20–29)
Calcium: 9.3 mg/dL (ref 8.7–10.2)
Chloride: 103 mmol/L (ref 96–106)
Creatinine, Ser: 0.95 mg/dL (ref 0.57–1.00)
Globulin, Total: 2.9 g/dL (ref 1.5–4.5)
Glucose: 94 mg/dL (ref 70–99)
Potassium: 4.1 mmol/L (ref 3.5–5.2)
Sodium: 141 mmol/L (ref 134–144)
Total Protein: 6.8 g/dL (ref 6.0–8.5)
eGFR: 69 mL/min/{1.73_m2} (ref >59)

## 2022-12-02 LAB — LIPID PANEL
Chol/HDL Ratio: 3.3 ratio (ref 0.0–4.4)
Cholesterol, Total: 136 mg/dL (ref 100–199)
HDL: 41 mg/dL (ref >39)
LDL Chol Calc (NIH): 75 mg/dL (ref 0–99)
Triglycerides: 108 mg/dL (ref 0–149)
VLDL Cholesterol Cal: 20 mg/dL (ref 5–40)

## 2022-12-02 MED ORDER — EZETIMIBE 10 MG PO TABS: 10.0000 mg | ORAL_TABLET | Freq: Every day | ORAL | 3 refills | Status: DC

## 2022-12-02 NOTE — Telephone Encounter (Signed)
Left message to call cardiac rehab.Cidney Kirkwood Walden Onofrio Klemp RN BSN  

## 2022-12-02 NOTE — Telephone Encounter (Signed)
See lab results.  

## 2022-12-03 ENCOUNTER — Telehealth: Payer: Self-pay | Admitting: *Deleted

## 2022-12-03 ENCOUNTER — Telehealth (HOSPITAL_COMMUNITY): Payer: Self-pay | Admitting: *Deleted

## 2022-12-03 NOTE — Telephone Encounter (Signed)
Left message to call cardiac rehab.Gemma Ruan Walden Jourdan Durbin RN BSN  

## 2022-12-03 NOTE — Telephone Encounter (Signed)
Please contact patient and determine what is needed. Cardiac rehab orders have to be signed by the MD, so once clarification is received, please forward to Dr. Clifton James.

## 2022-12-03 NOTE — Telephone Encounter (Signed)
good morning [name] [name] had chest pain at cardiac rehab back on 11/20/22. Joy Searing [name] told her to get an appointment for office eval. She had labs drawn on tues and did not get an office appointment. Does she need to be seen in the office for follow up before returning to cardiac rehab? [name]'s exercise has been on hold.  1  MW [name] is already enrolled and has been participating in the program.  18 mins You were added by [name], [name]. 18 mins MW Joy Copa, [name] thanks for your assistance in advance  18 mins MW Joy Levi Aland, [name] [name]. Thank you for the update. We will call and get her scheduled before 8/ 6. [name] stated pt can either wait for 8/6 or f/s for sooner appt.

## 2022-12-03 NOTE — Telephone Encounter (Signed)
Pt's appts have already been scheduled for cardiac rehab starting 12/04/22.

## 2022-12-04 ENCOUNTER — Telehealth (HOSPITAL_COMMUNITY): Payer: Self-pay | Admitting: *Deleted

## 2022-12-04 ENCOUNTER — Ambulatory Visit: Payer: 59 | Attending: Nurse Practitioner | Admitting: Nurse Practitioner

## 2022-12-04 ENCOUNTER — Encounter: Payer: Self-pay | Admitting: Nurse Practitioner

## 2022-12-04 ENCOUNTER — Encounter (HOSPITAL_COMMUNITY): Payer: 59

## 2022-12-04 VITALS — BP 130/82 | HR 67 | Ht 64.0 in | Wt 212.0 lb

## 2022-12-04 DIAGNOSIS — I2510 Atherosclerotic heart disease of native coronary artery without angina pectoris: Secondary | ICD-10-CM

## 2022-12-04 DIAGNOSIS — I10 Essential (primary) hypertension: Secondary | ICD-10-CM

## 2022-12-04 DIAGNOSIS — Z720 Tobacco use: Secondary | ICD-10-CM

## 2022-12-04 DIAGNOSIS — E785 Hyperlipidemia, unspecified: Secondary | ICD-10-CM | POA: Diagnosis not present

## 2022-12-04 NOTE — Telephone Encounter (Signed)
Received chat message from Eligha Bridegroom NP with Dr. Clifton James.  Pt seen in follow up today, okay to resume Cardiac Rehab.  Called and left message for pt indicating she could return to CR on Monday. Alanson Aly, BSN Cardiac and Emergency planning/management officer

## 2022-12-04 NOTE — Patient Instructions (Signed)
Medication Instructions:   Your physician recommends that you continue on your current medications as directed. Please refer to the Current Medication list given to you today.   *If you need a refill on your cardiac medications before your next appointment, please call your pharmacy*   Lab Work:  Keep appointment in September for fasting labs.   If you have labs (blood work) drawn today and your tests are completely normal, you will receive your results only by: MyChart Message (if you have MyChart) OR A paper copy in the mail If you have any lab test that is abnormal or we need to change your treatment, we will call you to review the results.   Testing/Procedures:  None ordered.   Follow-Up: At [facility], you and your health needs are our priority.  As part of our continuing mission to provide you with exceptional heart care, we have created designated Provider Care Teams.  These Care Teams include your primary Cardiologist (physician) and Advanced Practice Providers (APPs -  Physician Assistants and Nurse Practitioners) who all work together to provide you with the care you need, when you need it.  We recommend signing up for the patient portal called "MyChart".  Sign up information is provided on this After Visit Summary.  MyChart is used to connect with patients for Virtual Visits (Telemedicine).  Patients are able to view lab/test results, encounter notes, upcoming appointments, etc.  Non-urgent messages can be sent to your provider as well.   To learn more about what you can do with MyChart, go to [email or web address].    Your next appointment:   6 month(s)  Provider:   Verne Carrow, MD  or Lebron Conners.   Other Instructions  Your physician wants you to follow-up in: 6 months.  You will receive a reminder letter in the mail two months in advance. If you don't receive a letter, please call our office to schedule the follow-up appointment.

## 2022-12-04 NOTE — Progress Notes (Signed)
Cardiology Office Note:    Date:  12/04/2022   ID:  [name], DOB January 23, 1966, MRN [id number]  PCP:  Fleet Contras, MD   [facility] Providers Cardiologist:  Verne Carrow, MD     Referring MD: Fleet Contras, MD   Chief Complaint: chest pain  History of Present Illness:    [name] is a very pleasant 57 y.o. female with a hx of CAD s/p NSTEMI, tobacco abuse, HTN, seizures, HLD, obesity, allergy to aspirin, and chronic back pain treated with narcotic therapy.   She was referred to cardiology and seen by Dr. Antoine Poche 05/12/2019 for preoperative clearance for bariatric surgery.  She had an abnormal EKG that revealed possible old anteroseptal infarct. EKG abnormality felt to be improper lead placement and she was cleared to proceed with surgery.  She presented to ED 09/14/22 with 1 week of intermittent chest pain while doing water aerobics. On the day of admission it increased to 9/10 and radiated into her shoulder, she additionally had some shortness of breath. Hs troponin elevated at 299 >> 488 >> 1011. EKG showed sinus rhythm, T wave inversions in anterior lateral leads with slight J-point elevation in lead III and aVR.  [facility] 09/15/2022 revealed severe proximal LAD stenosis treated successfully with DES, widely patent left main, left circumflex, and RCA with no significant stenoses, normal LVEDP. Recommendation antiplatelet monotherapy with clopidogrel due to aspirin allergy, needs to continue at least 12 months since this will be her only antiplatelet drug. She was discharged 09/16/22 on atorvastatin 80 mg daily (switched from simvastatin), clopidogrel 75 mg daily, metoprolol 25 mg twice daily and continued on amlodipine 5 mg daily.   Seen by me in clinic on 09/28/22, accompanied by her 2 sons. Reported feeling well with no further episodes of the chest pain that led her to go to the hospital. Angina was mid-sternal chest pain that gradually worsened and started to radiate  into her back and down left arm. Is gradually increasing her activity around home but has not resumed exercise, is eager to do so. Monitors BP at home which has been well-controlled.  Is working on smoking less, down to 3 cigarettes/day. Working on [employer] as well. Sleeps on multiple pillows for many years due to feeling like she cannot breath when she lies flat. Her son says her head is in an awkward position the way she sleeps.  No shortness of breath, edema, PND, early satiety. No change in symptoms recently and weight has remained stable. No problems with medications. No presyncope, syncope.   During exercise at cardiac rehab on 11/20/22, she reported chest pain. The on-site provider advised office visit prior to resumption of exercise.   Today, she is here for follow-up of chest pain that occurred while at cardiac rehab. She felt some some tightness during exercise and was advised not to exercise until further evaluation. BP was also elevated that day and she had forgotten to take her medications. She reported these symptoms also to PCP and he advised symptoms may be 2/2 acid reflux and advised her to take additional medication for reflux.  She reports no further occurrences of chest pain.  She has not been exercising but has been active cleaning her house. It is typical for her to work for a while then rest. This has not worsened. Notes HR speed up going up and down stairs in her home. No increase in activity intolerance, shortness of breath, palpitations, orthopnea, PND, presyncope or syncope. Reports dietary  improvements to limit saturated fat, sodium, and sugar. Has joined [employer].   Past Medical History:  Diagnosis Date   Arthritis    Carpal tunnel syndrome    chronoic muscle spasms  & back pain   Chronic back pain    Fibroids    uterine   GERD (gastroesophageal reflux disease)    Hypertension    Seizures (HCC)    last seizure 4-5 years ago/on keppra   Single stillborn delivery  outcome    Smoker     Past Surgical History:  Procedure Laterality Date   ABDOMINAL HYSTERECTOMY     BACK SURGERY  02/2017   CESAREAN SECTION     1 time   CORONARY STENT INTERVENTION N/A 09/15/2022   Procedure: CORONARY STENT INTERVENTION;  Surgeon: [name], MD;  Location: [facility] INVASIVE CV LAB;  Service: Cardiovascular;  Laterality: N/A;   LEFT HEART CATH AND CORONARY ANGIOGRAPHY N/A 09/15/2022   Procedure: LEFT HEART CATH AND CORONARY ANGIOGRAPHY;  Surgeon: [name], MD;  Location: [facility] INVASIVE CV LAB;  Service: Cardiovascular;  Laterality: N/A;   LUMBAR LAMINECTOMY/DECOMPRESSION MICRODISCECTOMY Left 02/03/2017   Procedure: LEFT SIDED LUMBAR 5-SACRUM 1 MICRODISECTOMY;  Surgeon: [name], MD;  Location: [facility] OR;  Service: Orthopedics;  Laterality: Left;  LEFT SIDED LUMBAR 5-SACRUM 1 MICRODISECTOMY;    MYOMECTOMY     TUBAL LIGATION      Current Medications: Current Meds  Medication Sig   amLODipine (NORVASC) 5 MG tablet Take 5 mg by mouth daily.   atorvastatin (LIPITOR) 80 MG tablet Take 1 tablet (80 mg total) by mouth daily.   carisoprodol (SOMA) 350 MG tablet Take 350 mg by mouth daily as needed for muscle spasms.   clopidogrel (PLAVIX) 75 MG tablet Take 1 tablet (75 mg total) by mouth daily with breakfast.   diclofenac Sodium (VOLTAREN) 1 % GEL Apply 1 Application topically as needed for moderate pain.   ezetimibe (ZETIA) 10 MG tablet Take 1 tablet (10 mg total) by mouth daily.   folic acid (FOLVITE) 400 MCG tablet Take 400 mcg by mouth daily.   HYDROcodone-acetaminophen (NORCO) 10-325 MG tablet Take 1 tablet by mouth up to 4 (four) times daily as needed for severe pain only.   levETIRAcetam (KEPPRA) 500 MG tablet Take 1 tablet (500 mg total) by mouth 2 (two) times daily.   metoprolol tartrate (LOPRESSOR) 25 MG tablet Take 1 tablet (25 mg total) by mouth 2 (two) times daily.   MOUNJARO 2.5 MG/0.5ML Pen Inject 2.5 mg into the skin once a week.   nitroGLYCERIN  (NITROSTAT) 0.4 MG SL tablet Place 1 tablet (0.4 mg total) under the tongue every 5 (five) minutes x 3 doses as needed for chest pain.   pantoprazole (PROTONIX) 40 MG tablet Take 40 mg by mouth daily before breakfast.    promethazine (PHENERGAN) 25 MG tablet Take 1 tablet (25 mg total) by mouth every 6 (six) hours as needed.   promethazine (PHENERGAN) 6.25 MG/5ML syrup Take 20 mLs (25 mg total) by mouth every 8 (eight) hours as needed for nausea or vomiting.   RELISTOR 150 MG TABS Take 3 tablets by mouth every morning.   solifenacin (VESICARE) 10 MG tablet Take 10 mg by mouth daily.     Allergies:   Aspirin and Penicillins   Social History   Socioeconomic History   Marital status: Single    Spouse name: Not on file   Number of children: Not on file   Years of education: Not on  file   Highest education level: Not on file  Occupational History   Not on file  Tobacco Use   Smoking status: Every Day    Current packs/day: 0.50    Average packs/day: 0.5 packs/day for 30.0 years (15.0 ttl pk-yrs)    Types: Cigarettes   Smokeless tobacco: Never  Vaping Use   Vaping status: Never Used  Substance and Sexual Activity   Alcohol use: No   Drug use: No   Sexual activity: Not on file  Other Topics Concern   Not on file  Social History Narrative   Lives with son.  She has another son.  One grandchild.     Social Determinants of Health   Financial Resource Strain: Not on file  Food Insecurity: No Food Insecurity (03/24/2022)   Hunger Vital Sign    Worried About Running Out of Food in the Last Year: Never true    Ran Out of Food in the Last Year: Never true  Transportation Needs: No Transportation Needs (03/24/2022)   PRAPARE - Administrator, Civil Service (Medical): No    Lack of Transportation (Non-Medical): No  Physical Activity: Not on file  Stress: Not on file  Social Connections: Not on file     Family History: The patient's family history includes Breast cancer  in her sister; CAD in an other family member; Diabetes in her father; Diabetes Mellitus II in her father; Hypertension in her brother, father, mother, sister, sister, and sister.  ROS:   Please see the history of present illness.  All other systems reviewed and are negative.  Labs/Other Studies Reviewed:    The following studies were reviewed today:  LHC 09/16/22 1.  Severe proximal LAD stenosis, treated successfully with drug-eluting stent implantation using a 3.5 x 20 mm Synergy DES 2.  Widely patent left main, left circumflex, and RCA with no significant stenoses 3.  Normal LVEDP   Recommendation: Antiplatelet monotherapy with clopidogrel in this patient with aspirin allergy.  Needs to continue at least 12 months and long-term since this will be her only antiplatelet drug.   Echo 09/16/22 1. Left ventricular ejection fraction, by estimation, is 50 to 55%. The  left ventricle has low normal function. The left ventricle has no regional  wall motion abnormalities. There is moderate concentric left ventricular  hypertrophy. Left ventricular  diastolic parameters are consistent with Grade I diastolic dysfunction  (impaired relaxation). Elevated left ventricular end-diastolic pressure.   2. Right ventricular systolic function is normal. The right ventricular  size is normal. There is normal pulmonary artery systolic pressure. The  estimated right ventricular systolic pressure is 13.9 mmHg.   3. The mitral valve is normal in structure. Trivial mitral valve  regurgitation. No evidence of mitral stenosis.   4. The aortic valve is normal in structure. Aortic valve regurgitation is  not visualized. No aortic stenosis is present.   5. The inferior vena cava is normal in size with greater than 50%  respiratory variability, suggesting right atrial pressure of 3 mmHg.   6. Ascending aorta measurements are within normal limits for age when  indexed to body surface area.    Recent  Labs: 03/26/2022: Magnesium 1.6 09/07/2022: TSH 1.72 09/16/2022: Hemoglobin 11.1; Platelets 328 12/01/2022: ALT 16; BUN 15; Creatinine, Ser 0.95; Potassium 4.1; Sodium 141  Recent Lipid Panel    Component Value Date/Time   CHOL 136 12/01/2022 0952   TRIG 108 12/01/2022 0952   HDL 41 12/01/2022 0952   CHOLHDL  3.3 12/01/2022 0952   CHOLHDL 3.9 09/16/2022 0207   VLDL 13 09/16/2022 0207   LDLCALC 75 12/01/2022 0952   LDLCALC 104 (H) 09/07/2022 0000     Risk Assessment/Calculations:           Physical Exam:    VS:  BP 130/82   Pulse 67   Ht 5\' 4"  (1.626 m)   Wt 212 lb (96.2 kg)   SpO2 98%   BMI 36.39 kg/m     Wt Readings from Last 3 Encounters:  12/04/22 212 lb (96.2 kg)  10/12/22 214 lb 4.6 oz (97.2 kg)  09/28/22 209 lb 3.2 oz (94.9 kg)     GEN:  Well nourished, well developed in no acute distress HEENT: Normal NECK: No JVD; No carotid bruits CARDIAC: RRR, no murmurs, rubs, gallops RESPIRATORY:  Clear to auscultation without rales, wheezing or rhonchi  ABDOMEN: Soft, non-tender, non-distended MUSCULOSKELETAL:  No edema; No deformity. 2+ pedal pulses, equal bilaterally SKIN: Warm and dry NEUROLOGIC:  Alert and oriented x 3 PSYCHIATRIC:  Normal affect   EKG:  EKG is not ordered today.   Diagnoses:    1. Coronary artery disease involving native coronary artery of native heart without angina pectoris   2. Tobacco abuse   3. Primary hypertension   4. Hyperlipidemia LDL goal <50     Assessment and Plan:     CAD without angina: s/p NSTEMI with PCI/DES to LAD 09/15/22. Participating in cardiac rehab but had chest pain during exercise on 11/17/22 and was advised to follow-up with [place].  She has had no further occurrences of chest pain.  Reports activity level at home is stable.  No indication for further ischemic evaluation at this time. No problems with medications. No bleeding concerns. Not on aspirin 2/2 allergy. Continue GDMT including metoprolol, amlodipine,  clopidogrel, atorvastatin. She may return to cardiac rehab.   Hyperlipidemia LDL goal < 55: LDL 75 on 12/01/22. Switched from simvastatin to atorvastatin during hospitalization. We will recheck lipids/liver enzymes in 2 months. Encouraged heart healthy, mostly plant based diet and 150 minutes moderate intensity exercise each week. Continue atorvastatin.   Hypertension: BP is well controlled.  Tobacco abuse: Continues to smoke 3 cigarettes daily. I congratulated her the reduction from 1/2 PPD. Complete cessation advised.        Disposition: 6 months with Dr. Clifton James  Medication Adjustments/Labs and Tests Ordered: Current medicines are reviewed at length with the patient today.  Concerns regarding medicines are outlined above.  No orders of the defined types were placed in this encounter.  No orders of the defined types were placed in this encounter.   Patient Instructions  Medication Instructions:   Your physician recommends that you continue on your current medications as directed. Please refer to the Current Medication list given to you today.   *If you need a refill on your cardiac medications before your next appointment, please call your pharmacy*   Lab Work:  Keep appointment in September for fasting labs.   If you have labs (blood work) drawn today and your tests are completely normal, you will receive your results only by: MyChart Message (if you have MyChart) OR A paper copy in the mail If you have any lab test that is abnormal or we need to change your treatment, we will call you to review the results.   Testing/Procedures:  None ordered.   Follow-Up: At [facility], you and your health needs are our priority.  As part of our  continuing mission to provide you with exceptional heart care, we have created designated Provider Care Teams.  These Care Teams include your primary Cardiologist (physician) and Advanced Practice Providers (APPs -  Physician  Assistants and Nurse Practitioners) who all work together to provide you with the care you need, when you need it.  We recommend signing up for the patient portal called "MyChart".  Sign up information is provided on this After Visit Summary.  MyChart is used to connect with patients for Virtual Visits (Telemedicine).  Patients are able to view lab/test results, encounter notes, upcoming appointments, etc.  Non-urgent messages can be sent to your provider as well.   To learn more about what you can do with MyChart, go to [email or web address].    Your next appointment:   6 month(s)  Provider:   Verne Carrow, MD  or Lebron Conners.   Other Instructions  Your physician wants you to follow-up in: 6 months.  You will receive a reminder letter in the mail two months in advance. If you don't receive a letter, please call our office to schedule the follow-up appointment.     Signed, Levi Aland, NP  12/04/2022 3:54 PM    [facility]

## 2022-12-07 ENCOUNTER — Encounter (HOSPITAL_COMMUNITY)
Admission: RE | Admit: 2022-12-07 | Discharge: 2022-12-07 | Disposition: A | Discharge status: Home / Self Care | Payer: 59 | Source: Ambulatory Visit | Attending: Cardiovascular Disease | Admitting: Cardiovascular Disease

## 2022-12-07 DIAGNOSIS — I214 Non-ST elevation (NSTEMI) myocardial infarction: Secondary | ICD-10-CM | POA: Diagnosis present

## 2022-12-07 DIAGNOSIS — Z955 Presence of coronary angioplasty implant and graft: Secondary | ICD-10-CM | POA: Diagnosis not present

## 2022-12-08 NOTE — Progress Notes (Signed)
Hajra returned to cardiac rehab per Eligha Bridegroom NP and exercised without difficulty.Thayer Headings RN BSN

## 2022-12-09 ENCOUNTER — Encounter (HOSPITAL_COMMUNITY)
Admission: RE | Admit: 2022-12-09 | Discharge: 2022-12-09 | Disposition: A | Discharge status: Home / Self Care | Payer: 59 | Source: Ambulatory Visit | Attending: Cardiovascular Disease | Admitting: Cardiovascular Disease

## 2022-12-09 DIAGNOSIS — Z955 Presence of coronary angioplasty implant and graft: Secondary | ICD-10-CM

## 2022-12-09 DIAGNOSIS — I214 Non-ST elevation (NSTEMI) myocardial infarction: Secondary | ICD-10-CM | POA: Diagnosis not present

## 2022-12-11 ENCOUNTER — Encounter (HOSPITAL_COMMUNITY): Payer: 59

## 2022-12-11 ENCOUNTER — Telehealth (HOSPITAL_COMMUNITY): Payer: Self-pay

## 2022-12-11 NOTE — Telephone Encounter (Signed)
Called pt to inform of no CRP2 due to telemetry downtime. No answer, LM on VM.  Jonna Coup, MS, ACSM-CEP 12/11/2022 9:52 AM

## 2022-12-14 ENCOUNTER — Encounter (HOSPITAL_COMMUNITY)
Admission: RE | Admit: 2022-12-14 | Discharge: 2022-12-14 | Disposition: A | Discharge status: Home / Self Care | Payer: 59 | Source: Ambulatory Visit | Attending: Cardiovascular Disease | Admitting: Cardiovascular Disease

## 2022-12-14 ENCOUNTER — Other Ambulatory Visit (HOSPITAL_COMMUNITY): Payer: Self-pay

## 2022-12-14 DIAGNOSIS — Z955 Presence of coronary angioplasty implant and graft: Secondary | ICD-10-CM

## 2022-12-14 DIAGNOSIS — I214 Non-ST elevation (NSTEMI) myocardial infarction: Secondary | ICD-10-CM | POA: Diagnosis not present

## 2022-12-16 ENCOUNTER — Encounter (HOSPITAL_COMMUNITY)
Admission: RE | Admit: 2022-12-16 | Discharge: 2022-12-16 | Disposition: A | Discharge status: Home / Self Care | Payer: 59 | Source: Ambulatory Visit | Attending: Cardiovascular Disease | Admitting: Cardiovascular Disease

## 2022-12-16 DIAGNOSIS — Z955 Presence of coronary angioplasty implant and graft: Secondary | ICD-10-CM

## 2022-12-16 DIAGNOSIS — I214 Non-ST elevation (NSTEMI) myocardial infarction: Secondary | ICD-10-CM

## 2022-12-18 ENCOUNTER — Encounter (HOSPITAL_COMMUNITY)
Admission: RE | Admit: 2022-12-18 | Discharge: 2022-12-18 | Disposition: A | Discharge status: Home / Self Care | Payer: 59 | Source: Ambulatory Visit | Attending: Cardiovascular Disease | Admitting: Cardiovascular Disease

## 2022-12-18 DIAGNOSIS — I214 Non-ST elevation (NSTEMI) myocardial infarction: Secondary | ICD-10-CM

## 2022-12-18 DIAGNOSIS — Z955 Presence of coronary angioplasty implant and graft: Secondary | ICD-10-CM

## 2022-12-18 NOTE — Progress Notes (Signed)
Patient reports having a sharp pain in her left posterior forearm while putting on her heart monitor for exercise at cardiac rehab. Blood pressure 142/80. Heart rate 64. Sinus rhythm 64. Denies having chest pain or pain in her left arm currently. Onsite provider Eligha Bridegroom NP notified. yes it is ok to proceed, thank you  [facility] completed exercise without difficulty.Thayer Headings RN BSN

## 2022-12-21 ENCOUNTER — Encounter (HOSPITAL_COMMUNITY)
Admission: RE | Admit: 2022-12-21 | Discharge: 2022-12-21 | Disposition: A | Discharge status: Home / Self Care | Payer: 59 | Source: Ambulatory Visit | Attending: Cardiovascular Disease | Admitting: Cardiovascular Disease

## 2022-12-21 DIAGNOSIS — Z955 Presence of coronary angioplasty implant and graft: Secondary | ICD-10-CM

## 2022-12-21 DIAGNOSIS — I214 Non-ST elevation (NSTEMI) myocardial infarction: Secondary | ICD-10-CM | POA: Diagnosis not present

## 2022-12-23 ENCOUNTER — Encounter (HOSPITAL_COMMUNITY)
Admission: RE | Admit: 2022-12-23 | Discharge: 2022-12-23 | Disposition: A | Discharge status: Home / Self Care | Payer: 59 | Source: Ambulatory Visit | Attending: Cardiovascular Disease | Admitting: Cardiovascular Disease

## 2022-12-23 DIAGNOSIS — I214 Non-ST elevation (NSTEMI) myocardial infarction: Secondary | ICD-10-CM | POA: Diagnosis not present

## 2022-12-23 DIAGNOSIS — Z955 Presence of coronary angioplasty implant and graft: Secondary | ICD-10-CM

## 2022-12-23 NOTE — Progress Notes (Signed)
Cardiac Individual Treatment Plan  Patient Details  Name: [name] MRN: [id number] Date of Birth: [birth year] Referring Provider:   Flowsheet Row INTENSIVE CARDIAC REHAB ORIENT from 10/12/2022 in [facility]  Referring Provider Verne Carrow, MD       Initial Encounter Date:  Flowsheet Row INTENSIVE CARDIAC REHAB ORIENT from 10/12/2022 in [facility], [facility]  Date 10/12/22       Visit Diagnosis: 09/14/22 NSTEMI (non-ST elevated myocardial infarction) (HCC)  09/15/22 DES LAD  Patient's Home Medications on Admission:  Current Outpatient Medications:    amLODipine (NORVASC) 5 MG tablet, Take 5 mg by mouth daily., Disp: , Rfl:    atorvastatin (LIPITOR) 80 MG tablet, Take 1 tablet (80 mg total) by mouth daily., Disp: 90 tablet, Rfl: 1   carisoprodol (SOMA) 350 MG tablet, Take 350 mg by mouth daily as needed for muscle spasms., Disp: , Rfl:    clopidogrel (PLAVIX) 75 MG tablet, Take 1 tablet (75 mg total) by mouth daily with breakfast., Disp: 90 tablet, Rfl: 2   diclofenac Sodium (VOLTAREN) 1 % GEL, Apply 1 Application topically as needed for moderate pain., Disp: , Rfl:    ezetimibe (ZETIA) 10 MG tablet, Take 1 tablet (10 mg total) by mouth daily., Disp: 90 tablet, Rfl: 3   folic acid (FOLVITE) 400 MCG tablet, Take 400 mcg by mouth daily., Disp: , Rfl:    HYDROcodone-acetaminophen (NORCO) 10-325 MG tablet, Take 1 tablet by mouth up to 4 (four) times daily as needed for severe pain only., Disp: 120 tablet, Rfl: 0   levETIRAcetam (KEPPRA) 500 MG tablet, Take 1 tablet (500 mg total) by mouth 2 (two) times daily., Disp: 60 tablet, Rfl: 3   metoprolol tartrate (LOPRESSOR) 25 MG tablet, Take 1 tablet (25 mg total) by mouth 2 (two) times daily., Disp: 180 tablet, Rfl: 1   MOUNJARO 2.5 MG/0.5ML Pen, Inject 2.5 mg into the skin once a week., Disp: , Rfl:    nitroGLYCERIN  (NITROSTAT) 0.4 MG SL tablet, Place 1 tablet (0.4 mg total) under the tongue every 5 (five) minutes x 3 doses as needed for chest pain., Disp: 25 tablet, Rfl: 2   pantoprazole (PROTONIX) 40 MG tablet, Take 40 mg by mouth daily before breakfast. , Disp: , Rfl:    promethazine (PHENERGAN) 25 MG tablet, Take 1 tablet (25 mg total) by mouth every 6 (six) hours as needed., Disp: 30 tablet, Rfl: 0   promethazine (PHENERGAN) 6.25 MG/5ML syrup, Take 20 mLs (25 mg total) by mouth every 8 (eight) hours as needed for nausea or vomiting., Disp: 120 mL, Rfl: 1   RELISTOR 150 MG TABS, Take 3 tablets by mouth every morning., Disp: , Rfl:    solifenacin (VESICARE) 10 MG tablet, Take 10 mg by mouth daily., Disp: , Rfl:   Past Medical History: Past Medical History:  Diagnosis Date   Arthritis    Carpal tunnel syndrome    chronoic muscle spasms  & back pain   Chronic back pain    Fibroids    uterine   GERD (gastroesophageal reflux disease)    Hypertension    Seizures (HCC)    last seizure 4-5 years ago/on keppra   Single stillborn delivery outcome    Smoker     Tobacco Use: Social History   Tobacco Use  Smoking Status Every Day   Current packs/day: 0.50   Average packs/day: 0.5 packs/day  for 30.0 years (15.0 ttl pk-yrs)   Types: Cigarettes  Smokeless Tobacco Never    Labs: Review Flowsheet  More data exists      Latest Ref Rng & Units 12/05/2007 11/01/2020 09/07/2022 09/16/2022 12/01/2022  Labs for ITP Cardiac and Pulmonary Rehab  Cholestrol 100 - 199 mg/dL - 161  096  045  409   LDL (calc) 0 - 99 mg/dL - 811  914  84  75   HDL-C >39 mg/dL - 44  39  34  41   Trlycerides 0 - 149 mg/dL - 782  956  67  213   Hemoglobin A1c 4.8 - 5.6 % - - - 6.1  -  PH, Arterial - 7.462  - - - -  PCO2 arterial - 31.2  - - - -  Bicarbonate - 22.0  - - - -  TCO2 - 20.1  - - - -  Acid-base deficit - 0.7  - - - -  O2 Saturation - 99.7  - - - -    Details            Capillary Blood Glucose: Lab Results   Component Value Date   GLUCAP 94 11/20/2022   GLUCAP 98 09/16/2022   GLUCAP 116 (H) 07/26/2012     Exercise Target Goals: Exercise Program Goal: Individual exercise prescription set using results from initial 6 min walk test and THRR while considering  patient's activity barriers and safety.   Exercise Prescription Goal: Initial exercise prescription builds to 30-45 minutes a day of aerobic activity, 2-3 days per week.  Home exercise guidelines will be given to patient during program as part of exercise prescription that the participant will acknowledge.  Activity Barriers & Risk Stratification:  Activity Barriers & Cardiac Risk Stratification - 10/12/22 1348       Activity Barriers & Cardiac Risk Stratification   Activity Barriers Balance Concerns;Deconditioning;Joint Problems;Assistive Device;Arthritis;Back Problems    Cardiac Risk Stratification High   <5 METs on            6 Minute Walk:  6 Minute Walk     Row Name 10/12/22 1343         6 Minute Walk   Phase Initial     Distance 480 feet     Walk Time 6 minutes     # of Rest Breaks 1  4:30-6:00, pt became lightheaded     MPH 0.91     METS 1.4     RPE 11     Perceived Dyspnea  0     VO2 Peak 4.92     Symptoms Yes (comment)     Comments pt became lightheaded at min 4:30, had not eaten prior to visit. Sat down, given banana and lemon muffin, resolved. No pain.     Resting HR 60 bpm     Resting BP 124/82     Resting Oxygen Saturation  98 %     Exercise Oxygen Saturation  during 6 min walk 99 %     Max Ex. HR 79 bpm     Max Ex. BP 136/88     2 Minute Post BP 128/86              Oxygen Initial Assessment:   Oxygen Re-Evaluation:   Oxygen Discharge (Final Oxygen Re-Evaluation):   Initial Exercise Prescription:  Initial Exercise Prescription - 10/12/22 1300       Date of Initial Exercise RX and Referring Provider   Date  10/12/22    Referring Provider Verne Carrow, MD    Expected  Discharge Date 12/25/22      NuStep   Level 1    SPM 65    Minutes 15    METs 1.5      Prescription Details   Frequency (times per week) 3    Duration Progress to 30 minutes of continuous aerobic without signs/symptoms of physical distress      Intensity   THRR 40-80% of Max Heartrate 64-129    Ratings of Perceived Exertion 11-13    Perceived Dyspnea 0-4      Progression   Progression Continue progressive overload as per policy without signs/symptoms or physical distress.      Resistance Training   Training Prescription Yes    Weight 2    Reps 10-15             Perform Capillary Blood Glucose checks as needed.  Exercise Prescription Changes:   Exercise Prescription Changes     Row Name 10/21/22 1600 11/06/22 1700 12/14/22 1720         Response to Exercise   Blood Pressure (Admit) 132/78 132/84 126/70     Blood Pressure (Exercise) 130/68 142/80 120/80     Blood Pressure (Exit) 110/68 118/82 114/76     Heart Rate (Admit) 67 bpm 69 bpm 59 bpm     Heart Rate (Exercise) 84 bpm 97 bpm 94 bpm     Heart Rate (Exit) 71 bpm 68 bpm 68 bpm     Rating of Perceived Exertion (Exercise) 13 13 11      Perceived Dyspnea (Exercise) 0 0 0     Symptoms 3/10 rt chronic knee pain none none     Comments pt first day in CRP2 Reviewed METs and goals Reviewed METs and goals     Duration Continue with 30 min of aerobic exercise without signs/symptoms of physical distress. Progress to 30 minutes of  aerobic without signs/symptoms of physical distress Progress to 30 minutes of  aerobic without signs/symptoms of physical distress     Intensity THRR unchanged THRR unchanged THRR unchanged       Progression   Progression Continue to progress workloads to maintain intensity without signs/symptoms of physical distress. Continue to progress workloads to maintain intensity without signs/symptoms of physical distress. Continue to progress workloads to maintain intensity without signs/symptoms of  physical distress.     Average METs 1.6 1.8 1.9       Resistance Training   Training Prescription No  no wts on wed Yes Yes     Weight -- 2 2     Reps -- 10-15 10-15     Time -- 10 Minutes 10 Minutes       Interval Training   Interval Training No No No       NuStep   Level 1 1 2      SPM 77 105 94     Minutes 30 25 30      METs 1.6 1.8 1.9              Exercise Comments:   Exercise Comments     Row Name 10/21/22 1644 11/06/22 1708 12/04/22 1650 12/14/22 1724     Exercise Comments Pt first day in CRP2 program. Pt tolerated exercise well with an avg MET level of 1.6. Pt c/o 3/10 rt chronic knee pain but was able to tolerate the nustep without issue. Pt understands her ExRx, is learning her RPE scale and  THRR. Off to a great start. Will continue to progress workloads as tolerated without s/sx. Reviewed METs and goals. Pt eager to progress WL and time of exercise to acheive 30 minutes. Pt is progressing towards goals and feeling good in program. Pt has been absent awaiting cardiology clearance before return. Will review education upon return Reviewed METs and goals with pt. Pt is tolerating exercise well with an avg MET level of 1.8. Pt is feeling good about her goals. She has been able to increase her time to 30 mins and has increased her resistance. Overall pt doing well, but is still limited by fatigue and pain. She states she does have a PF membership, but just ICR is a lot for her right now. Will continue to increase WL as able             Exercise Goals and Review:   Exercise Goals     Row Name 10/12/22 1039             Exercise Goals   Increase Physical Activity Yes       Intervention Provide advice, education, support and counseling about physical activity/exercise needs.;Develop an individualized exercise prescription for aerobic and resistive training based on initial evaluation findings, risk stratification, comorbidities and participant's personal goals.        Expected Outcomes Short Term: Attend rehab on a regular basis to increase amount of physical activity.;Long Term: Exercising regularly at least 3-5 days a week.;Long Term: Add in home exercise to make exercise part of routine and to increase amount of physical activity.       Increase Strength and Stamina Yes       Intervention Provide advice, education, support and counseling about physical activity/exercise needs.;Develop an individualized exercise prescription for aerobic and resistive training based on initial evaluation findings, risk stratification, comorbidities and participant's personal goals.       Expected Outcomes Short Term: Increase workloads from initial exercise prescription for resistance, speed, and METs.;Short Term: Perform resistance training exercises routinely during rehab and add in resistance training at home;Long Term: Improve cardiorespiratory fitness, muscular endurance and strength as measured by increased METs and functional capacity ( )       Able to understand and use rate of perceived exertion (RPE) scale Yes       Intervention Provide education and explanation on how to use RPE scale       Expected Outcomes Short Term: Able to use RPE daily in rehab to express subjective intensity level;Long Term:  Able to use RPE to guide intensity level when exercising independently       Knowledge and understanding of Target Heart Rate Range (THRR) Yes       Intervention Provide education and explanation of THRR including how the numbers were predicted and where they are located for reference       Expected Outcomes Short Term: Able to state/look up THRR;Short Term: Able to use daily as guideline for intensity in rehab;Long Term: Able to use THRR to govern intensity when exercising independently       Understanding of Exercise Prescription Yes       Intervention Provide education, explanation, and written materials on patient's individual exercise prescription       Expected  Outcomes Short Term: Able to explain program exercise prescription;Long Term: Able to explain home exercise prescription to exercise independently                Exercise Goals Re-Evaluation :  Exercise Goals Re-Evaluation  Row Name 10/21/22 1643 11/06/22 1702 12/14/22 1722         Exercise Goal Re-Evaluation   Exercise Goals Review Increase Physical Activity;Understanding of Exercise Prescription;Increase Strength and Stamina;Knowledge and understanding of Target Heart Rate Range (THRR);Able to understand and use rate of perceived exertion (RPE) scale Increase Physical Activity;Understanding of Exercise Prescription;Increase Strength and Stamina;Knowledge and understanding of Target Heart Rate Range (THRR);Able to understand and use rate of perceived exertion (RPE) scale Increase Physical Activity;Understanding of Exercise Prescription;Increase Strength and Stamina;Knowledge and understanding of Target Heart Rate Range (THRR);Able to understand and use rate of perceived exertion (RPE) scale     Comments Pt first day in CRP2 program. Pt tolerated exercise well with an avg MET level of 1.6. Pt c/o 3/10 rt chronic knee pain but was able to tolerate the nustep without issue. Pt understands her ExRx, is learning her RPE scale and THRR. Off to a great start. Reviewed METs and goals with pt. Pt is tolerating exercise well with an avg MET level of 1.8. Discussed with pt goal of 30 min of exercise in CRP2, pt is eager to try 30 min on nustep next session. Also discussed increasing WL, which we will do once pt able to tolerate 30 min. Pt has been working on her goals of diet information and wt loss by speaking with RD regularly and feels good about progress. She has not lost wt, but is learning a lot. She feels good about learning how to modify exercise so that it's not painful and feels very good about her improvement of strength and stamina. Pt states that she overall is feeling much better and enjoying  program. Reviewed METs and goals with pt. Pt is tolerating exercise well with an avg MET level of 1.8. Pt is feeling good about her goals. She has been able to increase her time to 30 mins and has increased her resistance. Overall pt doing well, but is still limited by fatigue and pain. She states she does have a PF membership, but just ICR is a lot for her right now. Will continue to increase WL as able     Expected Outcomes Will continue to progress workloads as tolerated without s/sx. Will continue to progress workloads as tolerated, pt will achieve 30 min of continuous exercise. Will continue to progress workloads as tolerated, pt will achieve 30 min of continuous exercise.              Discharge Exercise Prescription (Final Exercise Prescription Changes):  Exercise Prescription Changes - 12/14/22 1720       Response to Exercise   Blood Pressure (Admit) 126/70    Blood Pressure (Exercise) 120/80    Blood Pressure (Exit) 114/76    Heart Rate (Admit) 59 bpm    Heart Rate (Exercise) 94 bpm    Heart Rate (Exit) 68 bpm    Rating of Perceived Exertion (Exercise) 11    Perceived Dyspnea (Exercise) 0    Symptoms none    Comments Reviewed METs and goals    Duration Progress to 30 minutes of  aerobic without signs/symptoms of physical distress    Intensity THRR unchanged      Progression   Progression Continue to progress workloads to maintain intensity without signs/symptoms of physical distress.    Average METs 1.9      Resistance Training   Training Prescription Yes    Weight 2    Reps 10-15    Time 10 Minutes      Interval  Training   Interval Training No      NuStep   Level 2    SPM 94    Minutes 30    METs 1.9             Nutrition:  Target Goals: Understanding of nutrition guidelines, daily intake of sodium 1500mg , cholesterol 200mg , calories 30% from fat and 7% or less from saturated fats, daily to have 5 or more servings of fruits and  vegetables.  Biometrics:  Pre Biometrics - 10/12/22 1056       Pre Biometrics   Waist Circumference 45.5 inches    Hip Circumference 50.5 inches    Waist to Hip Ratio 0.9 %    Triceps Skinfold 42 mm    % Body Fat 49.1 %    Grip Strength 20 kg    Flexibility 0 in   not done due to low back issues   Single Leg Stand 1.5 seconds              Nutrition Therapy Plan and Nutrition Goals:  Nutrition Therapy & Goals - 12/17/22 1349       Nutrition Therapy   Diet Heart healthy diet    Drug/Food Interactions Statins/Certain Fruits      Personal Nutrition Goals   Nutrition Goal Patient to identify strategies for reducing cardiovascular risk by attending the [facility] education and nutrition series weekly.   Goal in action.   Personal Goal #2 Patient to improve diet quality by using the plate method as a guide for meal planning to include lean protein/plant protein, fruits, vegetables, whole grains, nonfat dairy as part of a well-balanced diet.   Goal in action.   Personal Goal #3 Patient to reduce sodium intake to 1500mg  per day   Goal in action.   Comments Goals in action. Arionah continues to attend the [facility] education and nutrition series regularly. She has started making many dietary changes including reduced sodium, increased dietary fiber, and decreased saturated fat intake. She is down 2.4# since starting with our program. Patient will benefit from participation in intensive cardiac rehab for nutrition, exercise, and lifestyle modification.      Intervention Plan   Intervention Prescribe, educate and counsel regarding individualized specific dietary modifications aiming towards targeted core components such as weight, hypertension, lipid management, diabetes, heart failure and other comorbidities.;Nutrition handout(s) given to patient.    Expected Outcomes Short Term Goal: Understand basic principles of dietary content, such as calories, fat, sodium, cholesterol and  nutrients.;Long Term Goal: Adherence to prescribed nutrition plan.             Nutrition Assessments:  MEDIFICTS Score Key: ?70 Need to make dietary changes  40-70 Heart Healthy Diet ? 40 Therapeutic Level Cholesterol Diet    Picture Your Plate Scores: <16 Unhealthy dietary pattern with much room for improvement. 41-50 Dietary pattern unlikely to meet recommendations for good health and room for improvement. 51-60 More healthful dietary pattern, with some room for improvement.  >60 Healthy dietary pattern, although there may be some specific behaviors that could be improved.    Nutrition Goals Re-Evaluation:  Nutrition Goals Re-Evaluation     Row Name 10/23/22 1516 11/16/22 1626 12/17/22 1349         Goals   Current Weight 211 lb 3.2 oz (95.8 kg) 212 lb 11.9 oz (96.5 kg) 211 lb 13.8 oz (96.1 kg)     Comment lipids WNL- HDL 34, LDL 84 (goal is <55), A1c 6.1, lipoproteinA 200 no new  labs; most recent labs lipids WNL- HDL 34, LDL 84 (goal is <55), A1c 6.1, lipoproteinA 200 Lipids WNL, LDL 75 (goal is <55, started zetia and will repeat labs 9/24); other most recent labs include A1c 6.1, lipoproteinA 200     Expected Outcome Patient will benefit from participation in intensive cardiac rehab for nutrition, exercise, and lifestyle modification. Goals in action. Kamiko continues to attend the [facility] education and nutrition series regularly. She has started making many dietary changes including reduced sodium, increased dietary fiber, and decreased saturated fat intake. Patient will benefit from participation in intensive cardiac rehab for nutrition, exercise, and lifestyle modification. Goals in action. Natonia continues to attend the [facility] education and nutrition series regularly. She has started making many dietary changes including reduced sodium, increased dietary fiber, and decreased saturated fat intake. She is down 2.4# since starting with our program. LDL has improved but remains  >55; she started zetia on 12/02/22. Patient will benefit from participation in intensive cardiac rehab for nutrition, exercise, and lifestyle modification.              Nutrition Goals Re-Evaluation:  Nutrition Goals Re-Evaluation     Row Name 10/23/22 1516 11/16/22 1626 12/17/22 1349         Goals   Current Weight 211 lb 3.2 oz (95.8 kg) 212 lb 11.9 oz (96.5 kg) 211 lb 13.8 oz (96.1 kg)     Comment lipids WNL- HDL 34, LDL 84 (goal is <55), A1c 6.1, lipoproteinA 200 no new labs; most recent labs lipids WNL- HDL 34, LDL 84 (goal is <55), A1c 6.1, lipoproteinA 200 Lipids WNL, LDL 75 (goal is <55, started zetia and will repeat labs 9/24); other most recent labs include A1c 6.1, lipoproteinA 200     Expected Outcome Patient will benefit from participation in intensive cardiac rehab for nutrition, exercise, and lifestyle modification. Goals in action. Starleigh continues to attend the [facility] education and nutrition series regularly. She has started making many dietary changes including reduced sodium, increased dietary fiber, and decreased saturated fat intake. Patient will benefit from participation in intensive cardiac rehab for nutrition, exercise, and lifestyle modification. Goals in action. Danecia continues to attend the [facility] education and nutrition series regularly. She has started making many dietary changes including reduced sodium, increased dietary fiber, and decreased saturated fat intake. She is down 2.4# since starting with our program. LDL has improved but remains >55; she started zetia on 12/02/22. Patient will benefit from participation in intensive cardiac rehab for nutrition, exercise, and lifestyle modification.              Nutrition Goals Discharge (Final Nutrition Goals Re-Evaluation):  Nutrition Goals Re-Evaluation - 12/17/22 1349       Goals   Current Weight 211 lb 13.8 oz (96.1 kg)    Comment Lipids WNL, LDL 75 (goal is <55, started zetia and will repeat labs  9/24); other most recent labs include A1c 6.1, lipoproteinA 200    Expected Outcome Goals in action. Tasheanna continues to attend the [facility] education and nutrition series regularly. She has started making many dietary changes including reduced sodium, increased dietary fiber, and decreased saturated fat intake. She is down 2.4# since starting with our program. LDL has improved but remains >55; she started zetia on 12/02/22. Patient will benefit from participation in intensive cardiac rehab for nutrition, exercise, and lifestyle modification.             Psychosocial: Target Goals: Acknowledge presence or absence of significant depression  and/or stress, maximize coping skills, provide positive support system. Participant is able to verbalize types and ability to use techniques and skills needed for reducing stress and depression.  Initial Review & Psychosocial Screening:  Initial Psych Review & Screening - 10/12/22 1112       Initial Review   Current issues with Current Stress Concerns    Source of Stress Concerns Chronic Illness;Retirement/disability    Comments Merrilee stated that she is not currently depressed or anxious, however does have some slight feelings of stress related to her health and work. Keliah shared that her mother passed away from sudden cardiac arrest and that her own event caused a lot of fear due to this. Amneh also shared that she has been on disability for around 20 years. She is very motivated for cardiac rehab and has a positive mindset entering the program.      Family Dynamics   Good Support System? Yes   Audria has her sons and large family for support     Barriers   Psychosocial barriers to participate in program The patient should benefit from training in stress management and relaxation.      Screening Interventions   Interventions Encouraged to exercise;Provide feedback about the scores to participant;To provide support and resources with identified  psychosocial needs    Expected Outcomes Long Term Goal: Stressors or current issues are controlled or eliminated.;Short Term goal: Identification and review with participant of any Quality of Life or Depression concerns found by scoring the questionnaire.;Long Term goal: The participant improves quality of Life and PHQ9 Scores as seen by post scores and/or verbalization of changes;Short Term goal: Utilizing psychosocial counselor, staff and physician to assist with identification of specific Stressors or current issues interfering with healing process. Setting desired goal for each stressor or current issue identified.             Quality of Life Scores:  Quality of Life - 10/12/22 1354       Quality of Life   Select Quality of Life      Quality of Life Scores   Health/Function Pre 23.6 %    Socioeconomic Pre 25.17 %    Psych/Spiritual Pre 24.86 %    Family Pre 28.5 %    GLOBAL Pre 24.78 %            Scores of 19 and below usually indicate a poorer quality of life in these areas.  A difference of  2-3 points is a clinically meaningful difference.  A difference of 2-3 points in the total score of the Quality of Life Index has been associated with significant improvement in overall quality of life, self-image, physical symptoms, and general health in studies assessing change in quality of life.  PHQ-9: Review Flowsheet       10/12/2022 03/29/2019  Depression screen PHQ 2/9  Decreased Interest 0 0  Down, Depressed, Hopeless 0 0  PHQ - 2 Score 0 0  Altered sleeping 0 -  Tired, decreased energy 1 -  Change in appetite 0 -  Feeling bad or failure about yourself  0 -  Trouble concentrating 0 -  Moving slowly or fidgety/restless 0 -  Suicidal thoughts 0 -  PHQ-9 Score 1 -  Difficult doing work/chores Not difficult at all -    Details           Interpretation of Total Score  Total Score Depression Severity:  1-4 = Minimal depression, 5-9 = Mild depression, 10-14 =  Moderate depression, 15-19 = Moderately severe depression, 20-27 = Severe depression   Psychosocial Evaluation and Intervention:   Psychosocial Re-Evaluation:  Psychosocial Re-Evaluation     Row Name 10/22/22 1614 11/19/22 1111 12/23/22 1447         Psychosocial Re-Evaluation   Current issues with Current Stress Concerns Current Stress Concerns Current Stress Concerns     Comments Raghad was tearful on her first day of exercise as she told me she misses her mother who passed away in 01-23-2014 and lost 2 sisters on in 01-23-21 the other in 2011/01/24. Emotional support provided Marietherese was tearful as reporting right sided chest pain on 11/19/22.  Josilyn says that this is all new to her. Emotional support provided Eowyn has not voiced any increased concerns or stressors during exercise at cardiac rehab. Almyra will complete cardiac rehab on 01/08/23     Expected Outcomes Jenel will have decreased or contrilled  concerns or stressors upon compleiton of intensive cardiac rehab Emmersen will have decreased or contrilled  concerns or stressors upon compleiton of intensive cardiac rehab Alyene will have decreased or contrilled  concerns or stressors upon compleiton of intensive cardiac rehab     Interventions Stress management education;Relaxation education;Encouraged to attend Cardiac Rehabilitation for the exercise Stress management education;Relaxation education;Encouraged to attend Cardiac Rehabilitation for the exercise Stress management education;Relaxation education;Encouraged to attend Cardiac Rehabilitation for the exercise     Continue Psychosocial Services  Follow up required by staff Follow up required by staff No Follow up required       Initial Review   Source of Stress Concerns Family;Retirement/disability Family;Retirement/disability Family;Retirement/disability     Comments Will continue to monitor and offer support as needed Will continue to monitor and offer support as needed Will continue to  monitor and offer support as needed              Psychosocial Discharge (Final Psychosocial Re-Evaluation):  Psychosocial Re-Evaluation - 12/23/22 1447       Psychosocial Re-Evaluation   Current issues with Current Stress Concerns    Comments Aletha has not voiced any increased concerns or stressors during exercise at cardiac rehab. Khadijah will complete cardiac rehab on 01/08/23    Expected Outcomes Melise will have decreased or contrilled  concerns or stressors upon compleiton of intensive cardiac rehab    Interventions Stress management education;Relaxation education;Encouraged to attend Cardiac Rehabilitation for the exercise    Continue Psychosocial Services  No Follow up required      Initial Review   Source of Stress Concerns Family;Retirement/disability    Comments Will continue to monitor and offer support as needed             Vocational Rehabilitation: Provide vocational rehab assistance to qualifying candidates.   Vocational Rehab Evaluation & Intervention:  Vocational Rehab - 10/12/22 1355       Initial Vocational Rehab Evaluation & Intervention   Assessment shows need for Vocational Rehabilitation No   Zahmya is not interested in vocational rehab at this time            Education: Education Goals: Education classes will be provided on a weekly basis, covering required topics. Participant will state understanding/return demonstration of topics presented.    Education     Row Name 10/21/22 1500     Education   Cardiac Education Topics Pritikin   Orthoptist   Educator Dietitian   Weekly Topic Adding Flavor - Sodium-Free   Instruction Review  Code 1- Verbalizes Understanding   Class Start Time 1400   Class Stop Time 1445   Class Time Calculation (min) 45 min    Row Name 10/23/22 1600     Education   Cardiac Education Topics Pritikin   Engineer, mining  Education   General Education Heart Disease Risk Reduction   Instruction Review Code 1- Verbalizes Understanding   Class Start Time 1405   Class Stop Time 1450   Class Time Calculation (min) 45 min    Row Name 10/26/22 1600     Education   Cardiac Education Topics Pritikin   Select Workshops     Workshops   Educator Exercise Physiologist   Select Exercise   Exercise Workshop Location manager and Fall Prevention   Instruction Review Code 1- Verbalizes Understanding   Class Start Time 1405   Class Stop Time 1445   Class Time Calculation (min) 40 min    Row Name 10/28/22 1500     Education   Cardiac Education Topics Pritikin   Customer service manager   Weekly Topic Fast and Healthy Breakfasts   Instruction Review Code 1- Verbalizes Understanding   Class Start Time 1400   Class Stop Time 1440   Class Time Calculation (min) 40 min    Row Name 10/30/22 1500     Education   Cardiac Education Topics Pritikin   Licensed conveyancer Nutrition   Nutrition Overview of the Pritikin Eating Plan   Instruction Review Code 1- Verbalizes Understanding   Class Start Time 1400   Class Stop Time 1450   Class Time Calculation (min) 50 min    Row Name 11/04/22 1500     Education   Cardiac Education Topics Pritikin   Orthoptist   Educator Dietitian   Weekly Topic Personalizing Your Pritikin Plate   Instruction Review Code 1- Verbalizes Understanding   Class Start Time 1355   Class Stop Time 1437   Class Time Calculation (min) 42 min    Row Name 11/06/22 1500     Education   Cardiac Education Topics Pritikin   Nurse, children's Exercise Physiologist   Select Psychosocial   Psychosocial Healthy Minds, Bodies, Hearts   Instruction Review Code 1- Verbalizes Understanding   Class Start Time 1357   Class Stop Time 1429   Class Time  Calculation (min) 32 min    Row Name 11/09/22 1500     Education   Cardiac Education Topics Pritikin   Glass blower/designer Nutrition   Nutrition Workshop Label Reading   Instruction Review Code 1- Tax inspector   Class Start Time 1400   Class Stop Time 1445   Class Time Calculation (min) 45 min    Row Name 11/11/22 1500     Education   Cardiac Education Topics Pritikin   Orthoptist   Educator Dietitian   Weekly Topic [employer] Desserts   Instruction Review Code 1- Verbalizes Understanding   Class Start Time 1400   Class Stop Time 1450   Class Time Calculation (min) 50 min    Row Name 11/13/22  1500     Education   Cardiac Education Topics Pritikin   Licensed conveyancer Nutrition   Nutrition Other  label reading   Instruction Review Code 1- Verbalizes Understanding   Class Start Time 1400   Class Stop Time 1440   Class Time Calculation (min) 40 min    Row Name 11/16/22 1500     Education   Cardiac Education Topics Pritikin   Immunologist Exercise Physiologist   Select Exercise   Exercise Workshop Exercise Basics: Building Your Action Plan   Instruction Review Code 1- Verbalizes Understanding   Class Start Time 1404   Class Stop Time 1448   Class Time Calculation (min) 44 min    Row Name 11/18/22 1147     Education   Cardiac Education Topics Pritikin   Customer service manager   Weekly Topic Tasty Appetizers and Snacks   Instruction Review Code 1- Verbalizes Understanding   Class Start Time 1400   Class Stop Time 1440   Class Time Calculation (min) 40 min    Row Name 11/20/22 1400     Education   Cardiac Education Topics Pritikin   Nurse, children's Exercise Physiologist  NB   Select Nutrition   Nutrition Nutrition  Action Plan   Instruction Review Code 1- Verbalizes Understanding   Class Start Time 1400   Class Stop Time 1440   Class Time Calculation (min) 40 min    Row Name 12/07/22 1600     Education   Cardiac Education Topics Pritikin   Psychologist, forensic Exercise Education   Exercise Education Biomechanial Limitations   Instruction Review Code 1- Verbalizes Understanding   Class Start Time 1406   Class Stop Time 1449   Class Time Calculation (min) 43 min    Row Name 12/09/22 1600     Education   Cardiac Education Topics Pritikin   Customer service manager   Weekly Topic Comforting Weekend Breakfasts   Instruction Review Code 1- Verbalizes Understanding   Class Start Time 1400   Class Stop Time 1445   Class Time Calculation (min) 45 min    Row Name 12/14/22 1500     Education   Cardiac Education Topics Pritikin   Licensed conveyancer Nutrition   Nutrition Facts on Fat   Instruction Review Code 1- Verbalizes Understanding   Class Start Time 1400   Class Stop Time 1440   Class Time Calculation (min) 40 min    Row Name 12/16/22 1500     Education   Cardiac Education Topics Pritikin   Customer service manager   Weekly Topic Fast Evening Meals   Instruction Review Code 1- Verbalizes Understanding   Class Start Time 1400   Class Stop Time 1440   Class Time Calculation (min) 40 min    Row Name 12/18/22 1500     Education   Cardiac Education Topics Pritikin   Licensed conveyancer  Nutrition   Nutrition Vitamins and Minerals   Instruction Review Code 1- Verbalizes Understanding   Class Start Time 1400   Class Stop Time 1445   Class Time Calculation (min) 45 min     Cooking School   Educator Dietitian    Row Name 12/21/22 1600      Education   Cardiac Education Topics Pritikin   Geographical information systems officer Psychosocial   Psychosocial Workshop Healthy Sleep for a Healthy Heart   Instruction Review Code 1- Verbalizes Understanding   Class Start Time 1400   Class Stop Time 1455   Class Time Calculation (min) 55 min            Core Videos: Exercise    Move It!  Clinical staff conducted group or individual video education with verbal and written material and guidebook.  Patient learns the recommended Pritikin exercise program. Exercise with the goal of living a long, healthy life. Some of the health benefits of exercise include controlled diabetes, healthier blood pressure levels, improved cholesterol levels, improved heart and lung capacity, improved sleep, and better body composition. Everyone should speak with their doctor before starting or changing an exercise routine.  Biomechanical Limitations Clinical staff conducted group or individual video education with verbal and written material and guidebook.  Patient learns how biomechanical limitations can impact exercise and how we can mitigate and possibly overcome limitations to have an impactful and balanced exercise routine.  Body Composition Clinical staff conducted group or individual video education with verbal and written material and guidebook.  Patient learns that body composition (ratio of muscle mass to fat mass) is a key component to assessing overall fitness, rather than body weight alone. Increased fat mass, especially visceral belly fat, can put [place] at increased risk for metabolic syndrome, type 2 diabetes, heart disease, and even death. It is recommended to combine diet and exercise (cardiovascular and resistance training) to improve your body composition. Seek guidance from your physician and exercise physiologist before implementing an exercise routine.  Exercise Action Plan Clinical staff conducted  group or individual video education with verbal and written material and guidebook.  Patient learns the recommended strategies to achieve and enjoy long-term exercise adherence, including variety, self-motivation, self-efficacy, and positive decision making. Benefits of exercise include fitness, good health, weight management, more energy, better sleep, less stress, and overall well-being.  Medical   Heart Disease Risk Reduction Clinical staff conducted group or individual video education with verbal and written material and guidebook.  Patient learns our heart is our most vital organ as it circulates oxygen, nutrients, white blood cells, and hormones throughout the entire body, and carries waste away. Data supports a plant-based eating plan like the Pritikin Program for its effectiveness in slowing progression of and reversing heart disease. The video provides a number of recommendations to address heart disease.   Metabolic Syndrome and Belly Fat  Clinical staff conducted group or individual video education with verbal and written material and guidebook.  Patient learns what metabolic syndrome is, how it leads to heart disease, and how one can reverse it and keep it from coming back. You have metabolic syndrome if you have 3 of the following 5 criteria: abdominal obesity, high blood pressure, high triglycerides, low HDL cholesterol, and high blood sugar.  Hypertension and Heart Disease Clinical staff conducted group or individual video education with verbal and written material and guidebook.  Patient learns that high blood pressure,  or hypertension, is very common in the [place]. Hypertension is largely due to excessive salt intake, but other important risk factors include being overweight, physical inactivity, drinking too much alcohol, smoking, and not eating enough potassium from fruits and vegetables. High blood pressure is a leading risk factor for heart attack, stroke, congestive heart  failure, dementia, kidney failure, and premature death. Long-term effects of excessive salt intake include stiffening of the arteries and thickening of heart muscle and organ damage. Recommendations include ways to reduce hypertension and the risk of heart disease.  Diseases of Our Time - Focusing on Diabetes Clinical staff conducted group or individual video education with verbal and written material and guidebook.  Patient learns why the best way to stop diseases of our time is prevention, through food and other lifestyle changes. Medicine (such as prescription pills and surgeries) is often only a Band-Aid on the problem, not a long-term solution. Most common diseases of our time include obesity, type 2 diabetes, hypertension, heart disease, and cancer. The Pritikin Program is recommended and has been proven to help reduce, reverse, and/or prevent the damaging effects of metabolic syndrome.  Nutrition   Overview of the Pritikin Eating Plan  Clinical staff conducted group or individual video education with verbal and written material and guidebook.  Patient learns about the Pritikin Eating Plan for disease risk reduction. The Pritikin Eating Plan emphasizes a wide variety of unrefined, minimally-processed carbohydrates, like fruits, vegetables, whole grains, and legumes. Go, Caution, and Stop food choices are explained. Plant-based and lean animal proteins are emphasized. Rationale provided for low sodium intake for blood pressure control, low added sugars for blood sugar stabilization, and low added fats and oils for coronary artery disease risk reduction and weight management.  Calorie Density  Clinical staff conducted group or individual video education with verbal and written material and guidebook.  Patient learns about calorie density and how it impacts the Pritikin Eating Plan. Knowing the characteristics of the food you choose will help you decide whether those foods will lead to weight gain or  weight loss, and whether you want to consume more or less of them. Weight loss is usually a side effect of the Pritikin Eating Plan because of its focus on low calorie-dense foods.  Label Reading  Clinical staff conducted group or individual video education with verbal and written material and guidebook.  Patient learns about the Pritikin recommended label reading guidelines and corresponding recommendations regarding calorie density, added sugars, sodium content, and whole grains.  Dining Out - Part 1  Clinical staff conducted group or individual video education with verbal and written material and guidebook.  Patient learns that restaurant meals can be sabotaging because they can be so high in calories, fat, sodium, and/or sugar. Patient learns recommended strategies on how to positively address this and avoid unhealthy pitfalls.  Facts on Fats  Clinical staff conducted group or individual video education with verbal and written material and guidebook.  Patient learns that lifestyle modifications can be just as effective, if not more so, as many medications for lowering your risk of heart disease. A Pritikin lifestyle can help to reduce your risk of inflammation and atherosclerosis (cholesterol build-up, or plaque, in the artery walls). Lifestyle interventions such as dietary choices and physical activity address the cause of atherosclerosis. A review of the types of fats and their impact on blood cholesterol levels, along with dietary recommendations to reduce fat intake is also included.  Nutrition Action Plan  Clinical staff conducted group  or individual video education with verbal and written material and guidebook.  Patient learns how to incorporate Pritikin recommendations into their lifestyle. Recommendations include planning and keeping personal health goals in mind as an important part of their success.  Healthy Mind-Set    Healthy Minds, Bodies, Hearts  Clinical staff conducted group  or individual video education with verbal and written material and guidebook.  Patient learns how to identify when they are stressed. Video will discuss the impact of that stress, as well as the many benefits of stress management. Patient will also be introduced to stress management techniques. The way we think, act, and feel has an impact on our hearts.  How Our Thoughts Can Heal Our Hearts  Clinical staff conducted group or individual video education with verbal and written material and guidebook.  Patient learns that negative thoughts can cause depression and anxiety. This can result in negative lifestyle behavior and serious health problems. Cognitive behavioral therapy is an effective method to help control our thoughts in order to change and improve our emotional outlook.  Additional Videos:  Exercise    Improving Performance  Clinical staff conducted group or individual video education with verbal and written material and guidebook.  Patient learns to use a non-linear approach by alternating intensity levels and lengths of time spent exercising to help burn more calories and lose more body fat. Cardiovascular exercise helps improve heart health, metabolism, hormonal balance, blood sugar control, and recovery from fatigue. Resistance training improves strength, endurance, balance, coordination, reaction time, metabolism, and muscle mass. Flexibility exercise improves circulation, posture, and balance. Seek guidance from your physician and exercise physiologist before implementing an exercise routine and learn your capabilities and proper form for all exercise.  Introduction to Yoga  Clinical staff conducted group or individual video education with verbal and written material and guidebook.  Patient learns about yoga, a discipline of the coming together of mind, breath, and body. The benefits of yoga include improved flexibility, improved range of motion, better posture and core strength,  increased lung function, weight loss, and positive self-image. Yoga's heart health benefits include lowered blood pressure, healthier heart rate, decreased cholesterol and triglyceride levels, improved immune function, and reduced stress. Seek guidance from your physician and exercise physiologist before implementing an exercise routine and learn your capabilities and proper form for all exercise.  Medical   Aging: Enhancing Your Quality of Life  Clinical staff conducted group or individual video education with verbal and written material and guidebook.  Patient learns key strategies and recommendations to stay in good physical health and enhance quality of life, such as prevention strategies, having an advocate, securing a Health Care Proxy and Power of Attorney, and keeping a list of medications and system for tracking them. It also discusses how to avoid risk for bone loss.  Biology of Weight Control  Clinical staff conducted group or individual video education with verbal and written material and guidebook.  Patient learns that weight gain occurs because we consume more calories than we burn (eating more, moving less). Even if your body weight is normal, you may have higher ratios of fat compared to muscle mass. Too much body fat puts you at increased risk for cardiovascular disease, heart attack, stroke, type 2 diabetes, and obesity-related cancers. In addition to exercise, following the Pritikin Eating Plan can help reduce your risk.  Decoding Lab Results  Clinical staff conducted group or individual video education with verbal and written material and guidebook.  Patient learns that lab  test reflects one measurement whose values change over time and are influenced by many factors, including medication, stress, sleep, exercise, food, hydration, pre-existing medical conditions, and more. It is recommended to use the knowledge from this video to become more involved with your lab results and  evaluate your numbers to speak with your doctor.   Diseases of Our Time - Overview  Clinical staff conducted group or individual video education with verbal and written material and guidebook.  Patient learns that according to the CDC, 50% to 70% of chronic diseases (such as obesity, type 2 diabetes, elevated lipids, hypertension, and heart disease) are avoidable through lifestyle improvements including healthier food choices, listening to satiety cues, and increased physical activity.  Sleep Disorders Clinical staff conducted group or individual video education with verbal and written material and guidebook.  Patient learns how good quality and duration of sleep are important to overall health and well-being. Patient also learns about sleep disorders and how they impact health along with recommendations to address them, including discussing with a physician.  Nutrition  Dining Out - Part 2 Clinical staff conducted group or individual video education with verbal and written material and guidebook.  Patient learns how to plan ahead and communicate in order to maximize their dining experience in a healthy and nutritious manner. Included are recommended food choices based on the type of restaurant the patient is visiting.   Fueling a Banker conducted group or individual video education with verbal and written material and guidebook.  There is a strong connection between our food choices and our health. Diseases like obesity and type 2 diabetes are very prevalent and are in large-part due to lifestyle choices. The Pritikin Eating Plan provides plenty of food and hunger-curbing satisfaction. It is easy to follow, affordable, and helps reduce health risks.  Menu Workshop  Clinical staff conducted group or individual video education with verbal and written material and guidebook.  Patient learns that restaurant meals can sabotage health goals because they are often packed with  calories, fat, sodium, and sugar. Recommendations include strategies to plan ahead and to communicate with the manager, chef, or server to help order a healthier meal.  Planning Your Eating Strategy  Clinical staff conducted group or individual video education with verbal and written material and guidebook.  Patient learns about the Pritikin Eating Plan and its benefit of reducing the risk of disease. The Pritikin Eating Plan does not focus on calories. Instead, it emphasizes high-quality, nutrient-rich foods. By knowing the characteristics of the foods, we choose, we can determine their calorie density and make informed decisions.  Targeting Your Nutrition Priorities  Clinical staff conducted group or individual video education with verbal and written material and guidebook.  Patient learns that lifestyle habits have a tremendous impact on disease risk and progression. This video provides eating and physical activity recommendations based on your personal health goals, such as reducing LDL cholesterol, losing weight, preventing or controlling type 2 diabetes, and reducing high blood pressure.  Vitamins and Minerals  Clinical staff conducted group or individual video education with verbal and written material and guidebook.  Patient learns different ways to obtain key vitamins and minerals, including through a recommended healthy diet. It is important to discuss all supplements you take with your doctor.   Healthy Mind-Set    Smoking Cessation  Clinical staff conducted group or individual video education with verbal and written material and guidebook.  Patient learns that cigarette smoking and tobacco addiction pose  a serious health risk which affects millions of people. Stopping smoking will significantly reduce the risk of heart disease, lung disease, and many forms of cancer. Recommended strategies for quitting are covered, including working with your doctor to develop a successful  plan.  Culinary   Becoming a Set designer conducted group or individual video education with verbal and written material and guidebook.  Patient learns that cooking at home can be healthy, cost-effective, quick, and puts them in control. Keys to cooking healthy recipes will include looking at your recipe, assessing your equipment needs, planning ahead, making it simple, choosing cost-effective seasonal ingredients, and limiting the use of added fats, salts, and sugars.  Cooking - Breakfast and Snacks  Clinical staff conducted group or individual video education with verbal and written material and guidebook.  Patient learns how important breakfast is to satiety and nutrition through the entire day. Recommendations include key foods to eat during breakfast to help stabilize blood sugar levels and to prevent overeating at meals later in the day. Planning ahead is also a key component.  Cooking - Educational psychologist conducted group or individual video education with verbal and written material and guidebook.  Patient learns eating strategies to improve overall health, including an approach to cook more at home. Recommendations include thinking of animal protein as a side on your plate rather than center stage and focusing instead on lower calorie dense options like vegetables, fruits, whole grains, and plant-based proteins, such as beans. Making sauces in large quantities to freeze for later and leaving the skin on your vegetables are also recommended to maximize your experience.  Cooking - Healthy Salads and Dressing Clinical staff conducted group or individual video education with verbal and written material and guidebook.  Patient learns that vegetables, fruits, whole grains, and legumes are the foundations of the Pritikin Eating Plan. Recommendations include how to incorporate each of these in flavorful and healthy salads, and how to create homemade salad dressings.  Proper handling of ingredients is also covered. Cooking - Soups and [employer] - Soups and Desserts Clinical staff conducted group or individual video education with verbal and written material and guidebook.  Patient learns that Pritikin soups and desserts make for easy, nutritious, and delicious snacks and meal components that are low in sodium, fat, sugar, and calorie density, while high in vitamins, minerals, and filling fiber. Recommendations include simple and healthy ideas for soups and desserts.   Overview     The Pritikin Solution Program Overview Clinical staff conducted group or individual video education with verbal and written material and guidebook.  Patient learns that the results of the Pritikin Program have been documented in more than 100 articles published in peer-reviewed journals, and the benefits include reducing risk factors for (and, in some cases, even reversing) high cholesterol, high blood pressure, type 2 diabetes, obesity, and more! An overview of the three key pillars of the Pritikin Program will be covered: eating well, doing regular exercise, and having a healthy mind-set.  WORKSHOPS  Exercise: Exercise Basics: Building Your Action Plan Clinical staff led group instruction and group discussion with PowerPoint presentation and patient guidebook. To enhance the learning environment the use of posters, models and videos may be added. At the conclusion of this workshop, patients will comprehend the difference between physical activity and exercise, as well as the benefits of incorporating both, into their routine. Patients will understand the FITT (Frequency, Intensity, Time, and Type) principle and how  to use it to build an exercise action plan. In addition, safety concerns and other considerations for exercise and cardiac rehab will be addressed by the presenter. The purpose of this lesson is to promote a comprehensive and effective weekly exercise routine in  order to improve patients' overall level of fitness.   Managing Heart Disease: Your Path to a Healthier Heart Clinical staff led group instruction and group discussion with PowerPoint presentation and patient guidebook. To enhance the learning environment the use of posters, models and videos may be added.At the conclusion of this workshop, patients will understand the anatomy and physiology of the heart. Additionally, they will understand how Pritikin's three pillars impact the risk factors, the progression, and the management of heart disease.  The purpose of this lesson is to provide a high-level overview of the heart, heart disease, and how the Pritikin lifestyle positively impacts risk factors.  Exercise Biomechanics Clinical staff led group instruction and group discussion with PowerPoint presentation and patient guidebook. To enhance the learning environment the use of posters, models and videos may be added. Patients will learn how the structural parts of their bodies function and how these functions impact their daily activities, movement, and exercise. Patients will learn how to promote a neutral spine, learn how to manage pain, and identify ways to improve their physical movement in order to promote healthy living. The purpose of this lesson is to expose patients to common physical limitations that impact physical activity. Participants will learn practical ways to adapt and manage aches and pains, and to minimize their effect on regular exercise. Patients will learn how to maintain good posture while sitting, walking, and lifting.  Balance Training and Fall Prevention  Clinical staff led group instruction and group discussion with PowerPoint presentation and patient guidebook. To enhance the learning environment the use of posters, models and videos may be added. At the conclusion of this workshop, patients will understand the importance of their sensorimotor skills (vision,  proprioception, and the vestibular system) in maintaining their ability to balance as they age. Patients will apply a variety of balancing exercises that are appropriate for their current level of function. Patients will understand the common causes for poor balance, possible solutions to these problems, and ways to modify their physical environment in order to minimize their fall risk. The purpose of this lesson is to teach patients about the importance of maintaining balance as they age and ways to minimize their risk of falling.  WORKSHOPS   Nutrition:  Fueling a Ship broker led group instruction and group discussion with PowerPoint presentation and patient guidebook. To enhance the learning environment the use of posters, models and videos may be added. Patients will review the foundational principles of the Pritikin Eating Plan and understand what constitutes a serving size in each of the food groups. Patients will also learn Pritikin-friendly foods that are better choices when away from home and review make-ahead meal and snack options. Calorie density will be reviewed and applied to three nutrition priorities: weight maintenance, weight loss, and weight gain. The purpose of this lesson is to reinforce (in a group setting) the key concepts around what patients are recommended to eat and how to apply these guidelines when away from home by planning and selecting Pritikin-friendly options. Patients will understand how calorie density may be adjusted for different weight management goals.  Mindful Eating  Clinical staff led group instruction and group discussion with PowerPoint presentation and patient guidebook. To enhance the learning environment  the use of posters, models and videos may be added. Patients will briefly review the concepts of the Pritikin Eating Plan and the importance of low-calorie dense foods. The concept of mindful eating will be introduced as well as the  importance of paying attention to internal hunger signals. Triggers for non-hunger eating and techniques for dealing with triggers will be explored. The purpose of this lesson is to provide patients with the opportunity to review the basic principles of the Pritikin Eating Plan, discuss the value of eating mindfully and how to measure internal cues of hunger and fullness using the Hunger Scale. Patients will also discuss reasons for non-hunger eating and learn strategies to use for controlling emotional eating.  Targeting Your Nutrition Priorities Clinical staff led group instruction and group discussion with PowerPoint presentation and patient guidebook. To enhance the learning environment the use of posters, models and videos may be added. Patients will learn how to determine their genetic susceptibility to disease by reviewing their family history. Patients will gain insight into the importance of diet as part of an overall healthy lifestyle in mitigating the impact of genetics and other environmental insults. The purpose of this lesson is to provide patients with the opportunity to assess their personal nutrition priorities by looking at their family history, their own health history and current risk factors. Patients will also be able to discuss ways of prioritizing and modifying the Pritikin Eating Plan for their highest risk areas  Menu  Clinical staff led group instruction and group discussion with PowerPoint presentation and patient guidebook. To enhance the learning environment the use of posters, models and videos may be added. Using menus brought in from [employer], or printed from [employer], patients will apply the Pritikin dining out guidelines that were presented in the [employer] video. Patients will also be able to practice these guidelines in a variety of provided scenarios. The purpose of this lesson is to provide patients with the opportunity to practice  hands-on learning of the Pritikin Dining Out guidelines with actual menus and practice scenarios.  Label Reading Clinical staff led group instruction and group discussion with PowerPoint presentation and patient guidebook. To enhance the learning environment the use of posters, models and videos may be added. Patients will review and discuss the Pritikin label reading guidelines presented in Pritikin's Label Reading Educational series video. Using fool labels brought in from local grocery stores and markets, patients will apply the label reading guidelines and determine if the packaged food meet the Pritikin guidelines. The purpose of this lesson is to provide patients with the opportunity to review, discuss, and practice hands-on learning of the Pritikin Label Reading guidelines with actual packaged food labels. Cooking School  Pritikin's [employer] are designed to teach patients ways to prepare quick, simple, and affordable recipes at home. The importance of nutrition's role in chronic disease risk reduction is reflected in its emphasis in the overall Pritikin program. By learning how to prepare essential core Pritikin Eating Plan recipes, patients will increase control over what they eat; be able to customize the flavor of foods without the use of added salt, sugar, or fat; and improve the quality of the food they consume. By learning a set of core recipes which are easily assembled, quickly prepared, and affordable, patients are more likely to prepare more healthy foods at home. These workshops focus on convenient breakfasts, simple entres, side dishes, and desserts which can be prepared with minimal effort and are consistent with  nutrition recommendations for cardiovascular risk reduction. Cooking [employer] are taught by a Armed forces logistics/support/administrative officer (RD) who has been trained by the [employer]. The chef or RD has a clear understanding of the importance of minimizing -  if not completely eliminating - added fat, sugar, and sodium in recipes. Throughout the series of Cooking School Workshop sessions, patients will learn about healthy ingredients and efficient methods of cooking to build confidence in their capability to prepare    Cooking School weekly topics:  Adding Flavor- Sodium-Free  Fast and Healthy Breakfasts  Powerhouse Plant-Based Proteins  Satisfying Salads and Dressings  Simple Sides and Sauces  International Cuisine-Spotlight on the [employer] Zones  Delicious Desserts  Savory Soups  [employer] - Meals in a Astronomer Appetizers and Snacks  Comforting Weekend Breakfasts  One-Pot Wonders   Fast Evening Meals  Landscape architect Your Pritikin Plate  WORKSHOPS   Healthy Mindset (Psychosocial):  Focused Goals, Sustainable Changes Clinical staff led group instruction and group discussion with PowerPoint presentation and patient guidebook. To enhance the learning environment the use of posters, models and videos may be added. Patients will be able to apply effective goal setting strategies to establish at least one personal goal, and then take consistent, meaningful action toward that goal. They will learn to identify common barriers to achieving personal goals and develop strategies to overcome them. Patients will also gain an understanding of how our mind-set can impact our ability to achieve goals and the importance of cultivating a positive and growth-oriented mind-set. The purpose of this lesson is to provide patients with a deeper understanding of how to set and achieve personal goals, as well as the tools and strategies needed to overcome common obstacles which may arise along the way.  From Head to Heart: The Power of a Healthy Outlook  Clinical staff led group instruction and group discussion with PowerPoint presentation and patient guidebook. To enhance the learning environment the use of posters, models and videos may be  added. Patients will be able to recognize and describe the impact of emotions and mood on physical health. They will discover the importance of self-care and explore self-care practices which may work for them. Patients will also learn how to utilize the 4 C's to cultivate a healthier outlook and better manage stress and challenges. The purpose of this lesson is to demonstrate to patients how a healthy outlook is an essential part of maintaining good health, especially as they continue their cardiac rehab journey.  Healthy Sleep for a Healthy Heart Clinical staff led group instruction and group discussion with PowerPoint presentation and patient guidebook. To enhance the learning environment the use of posters, models and videos may be added. At the conclusion of this workshop, patients will be able to demonstrate knowledge of the importance of sleep to overall health, well-being, and quality of life. They will understand the symptoms of, and treatments for, common sleep disorders. Patients will also be able to identify daytime and nighttime behaviors which impact sleep, and they will be able to apply these tools to help manage sleep-related challenges. The purpose of this lesson is to provide patients with a general overview of sleep and outline the importance of quality sleep. Patients will learn about a few of the most common sleep disorders. Patients will also be introduced to the concept of "sleep hygiene," and discover ways to self-manage certain sleeping problems through simple daily behavior changes. Finally, the workshop will motivate patients  by clarifying the links between quality sleep and their goals of heart-healthy living.   Recognizing and Reducing Stress Clinical staff led group instruction and group discussion with PowerPoint presentation and patient guidebook. To enhance the learning environment the use of posters, models and videos may be added. At the conclusion of this workshop, patients  will be able to understand the types of stress reactions, differentiate between acute and chronic stress, and recognize the impact that chronic stress has on their health. They will also be able to apply different coping mechanisms, such as reframing negative self-talk. Patients will have the opportunity to practice a variety of stress management techniques, such as deep abdominal breathing, progressive muscle relaxation, and/or guided imagery.  The purpose of this lesson is to educate patients on the role of stress in their lives and to provide healthy techniques for coping with it.  Learning Barriers/Preferences:  Learning Barriers/Preferences - 10/12/22 1354       Learning Barriers/Preferences   Learning Barriers Sight   wears glasses   Learning Preferences Audio;Computer/Internet;Group Instruction;Individual Instruction;Pictoral;Skilled Demonstration;Verbal Instruction;Video;Written Material             Education Topics:  Knowledge Questionnaire Score:  Knowledge Questionnaire Score - 10/12/22 1355       Knowledge Questionnaire Score   Pre Score 26/28             Core Components/Risk Factors/Patient Goals at Admission:  Personal Goals and Risk Factors at Admission - 10/12/22 1040       Core Components/Risk Factors/Patient Goals on Admission    Weight Management Yes;Obesity;Weight Loss    Intervention Weight Management: Develop a combined nutrition and exercise program designed to reach desired caloric intake, while maintaining appropriate intake of nutrient and fiber, sodium and fats, and appropriate energy expenditure required for the weight goal.;Weight Management: Provide education and appropriate resources to help participant work on and attain dietary goals.;Weight Management/Obesity: Establish reasonable short term and long term weight goals.;Obesity: Provide education and appropriate resources to help participant work on and attain dietary goals.    Expected Outcomes  Short Term: Continue to assess and modify interventions until short term weight is achieved;Long Term: Adherence to nutrition and physical activity/exercise program aimed toward attainment of established weight goal;Weight Loss: Understanding of general recommendations for a balanced deficit meal plan, which promotes 1-2 lb weight loss per week and includes a negative energy balance of [phone] kcal/d;Understanding recommendations for meals to include 15-35% energy as protein, 25-35% energy from fat, 35-60% energy from carbohydrates, less than 200mg  of dietary cholesterol, 20-35 gm of total fiber daily;Understanding of distribution of calorie intake throughout the day with the consumption of 4-5 meals/snacks    Tobacco Cessation Yes    Number of packs per day 3 cig/ day    Intervention Assist the participant in steps to quit. Provide individualized education and counseling about committing to Tobacco Cessation, relapse prevention, and pharmacological support that can be provided by physician.;Education officer, environmental, assist with locating and accessing local/national Quit Smoking programs, and support quit date choice.    Expected Outcomes Short Term: Will demonstrate readiness to quit, by selecting a quit date.;Long Term: Complete abstinence from all tobacco products for at least 12 months from quit date.;Short Term: Will quit all tobacco product use, adhering to prevention of relapse plan.    Hypertension Yes    Intervention Provide education on lifestyle modifcations including regular physical activity/exercise, weight management, moderate sodium restriction and increased consumption of fresh fruit, vegetables, and low fat dairy,  alcohol moderation, and smoking cessation.;Monitor prescription use compliance.    Expected Outcomes Long Term: Maintenance of blood pressure at goal levels.;Short Term: Continued assessment and intervention until BP is < 140/64mm HG in hypertensive participants. < 130/50mm  HG in hypertensive participants with diabetes, heart failure or chronic kidney disease.    Lipids Yes    Intervention Provide education and support for participant on nutrition & aerobic/resistive exercise along with prescribed medications to achieve LDL 70mg , HDL >40mg .    Expected Outcomes Short Term: Participant states understanding of desired cholesterol values and is compliant with medications prescribed. Participant is following exercise prescription and nutrition guidelines.;Long Term: Cholesterol controlled with medications as prescribed, with individualized exercise RX and with personalized nutrition plan. Value goals: LDL < 70mg , HDL > 40 mg.    Stress Yes    Intervention Offer individual and/or small group education and counseling on adjustment to heart disease, stress management and health-related lifestyle change. Teach and support self-help strategies.;Refer participants experiencing significant psychosocial distress to appropriate mental health specialists for further evaluation and treatment. When possible, include family members and significant others in education/counseling sessions.    Expected Outcomes Short Term: Participant demonstrates changes in health-related behavior, relaxation and other stress management skills, ability to obtain effective social support, and compliance with psychotropic medications if prescribed.;Long Term: Emotional wellbeing is indicated by absence of clinically significant psychosocial distress or social isolation.             Core Components/Risk Factors/Patient Goals Review:   Goals and Risk Factor Review     Row Name 11/19/22 1115 12/23/22 1452           Core Components/Risk Factors/Patient Goals Review   Personal Goals Review Weight Management/Obesity;Hypertension;Tobacco Cessation;Lipids;Stress Weight Management/Obesity;Hypertension;Tobacco Cessation;Lipids;Stress      Review Charisma has been doing well with exercise at intensive cardiac  rehab for his fitness level. Vital signs have been stable. Will continue to encourage smoking cessation Eboney has been doing well with exercise at intensive cardiac rehab for her fitness level. Vital signs have been stable. Will continue to encourage smoking cessation. Bhawna will complete cardiac rehab on 01/08/23      Expected Outcomes Charnele will continue to participate in intensive cardiac rehab for exercise, nutrition and lifestyle modifications Koren will continue to participate in intensive cardiac rehab for exercise, nutrition and lifestyle modifications               Core Components/Risk Factors/Patient Goals at Discharge (Final Review):   Goals and Risk Factor Review - 12/23/22 1452       Core Components/Risk Factors/Patient Goals Review   Personal Goals Review Weight Management/Obesity;Hypertension;Tobacco Cessation;Lipids;Stress    Review Shamieka has been doing well with exercise at intensive cardiac rehab for her fitness level. Vital signs have been stable. Will continue to encourage smoking cessation. Geniece will complete cardiac rehab on 01/08/23    Expected Outcomes Aunye will continue to participate in intensive cardiac rehab for exercise, nutrition and lifestyle modifications             ITP Comments:  ITP Comments     Row Name 10/12/22 1037 10/22/22 1610 11/19/22 1109 12/23/22 1446     ITP Comments Dr. Armanda Magic medical director. Introduction to pritikin education program/ intensive cardiac rehab. Initial orientation packet reviewed with patient. 30 Day ITP Review. Gethsemane started intensive cardiac rehab on 10/21/22 and did well with exercise. 30 Day ITP Review. Oviya has good attendance and participation in intensive cardiac rehab 30 Day ITP  Review. Prescott continues to have good attendance and participation in intensive cardiac rehab             Comments: See ITP comments.Thayer Headings RN BSN

## 2022-12-24 ENCOUNTER — Encounter: Payer: 59 | Admitting: *Deleted

## 2022-12-24 VITALS — HR 73 | Temp 97.9°F | Resp 18 | Ht 64.0 in | Wt 212.0 lb

## 2022-12-24 DIAGNOSIS — Z006 Encounter for examination for normal comparison and control in clinical research program: Secondary | ICD-10-CM

## 2022-12-24 NOTE — Progress Notes (Signed)
Daily Session Note  Patient Details  Name: SANIKA MARSALIS MRN: [id number] Date of Birth: [birth year] Referring Provider:   Flowsheet Row INTENSIVE CARDIAC REHAB ORIENT from 10/12/2022 in [facility]  Referring Provider Verne Carrow, MD       Encounter Date: 12/18/2022  Check In:   Capillary Blood Glucose: No results found for this or any previous visit (from the past 24 hour(s)).    Social History   Tobacco Use  Smoking Status Every Day   Current packs/day: 0.50   Average packs/day: 0.5 packs/day for 30.0 years (15.0 ttl pk-yrs)   Types: Cigarettes  Smokeless Tobacco Never    Goals Met:  Exercise tolerated well No report of concerns or symptoms today Strength training completed today  Goals Unmet:  Not Applicable  Comments: Jordis in for intensive cardiac rehab. Due to technical difficulties with LSI unable to configure data of the session for 7/26.     HR Bp Pre 66 142/80 Resting Ex 90 152/88  Nustep  Level 2.0 Met s2.0 Post 70 122/86  Recovery  Education- Vitamins and Minerals 45 minutes Time admin 2:47 pm  Time disch   4:20 pm   Dr. Armanda Magic is Medical Director for Cardiac Rehab at [facility].

## 2022-12-24 NOTE — Research (Addendum)
AA HEART  Informed Consent   Subject Name: [name]  Subject met inclusion and exclusion criteria.  The informed consent form, study requirements and expectations were reviewed with the subject and questions and concerns were addressed prior to the signing of the consent form.  The subject verbalized understanding of the trial requirements.  The subject agreed to participate in the AA HEART  trial and signed the informed consent at 13:16 on 12-24-2022.  The informed consent was obtained prior to performance of any protocol-specific procedures for the subject.  A copy of the signed informed consent was given to the subject and a copy was placed in the subject's medical record.   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  Yes []  OR No[]    Please FORWARD back to me with any changes or follow up!       ACCESSION NO. [id number]                                             Page 1 of 1                                                        INVESTIGATOR: ([id number])                          PROTOCOL   [id number]                     Thomasene Ripple, M.D.                              INVESTIGATOR NO.: [id number]                     c/o [facility]                         SUBJECT NUMBER: [id number]                     [place]                 SUBJECT INITIALS NOT COLLECTED:                     [address]                          VISIT: D0                     [place], [place] [address]                   SPONSOR REPORT TO:                 COLLECTION TIME:13:29 DATE:24-Dec-2022                     Cruzita Lederer  DATE RECEIVED IN LABORATORY: 25-Dec-2022                     c/o Sponsor [employer] access         DATE REPORTED BY LABORATORY: 26-Dec-2022                     [place]                          SEX: F  AGE: 57y                     [id number]                   [place], [place]  [place] [phone]                                                                                        Ref. Ranges               Clinical    Comments                                                                          Significance                                                                            Yes*  No                    HEMATOLOGY&DIFFERENTIAL PANEL                      HGB            12.1         11.6-16.4 g/dL                      HCT            39           34-48 %                      RBC            4.3          4.1-5.6 x106/uL                      MCH            28  26-34 pg                      MCHC           31           31-38 g/dL                      RDW            15.0         12.0-15.0 %                      RBC Morph      No Review Required                        MCV            91           79-98 fL                      WBC            5.52         3.80-10.70 x103/uL                      Neutrophil     2.29         1.96-7.23 x103/uL                      Lymphocyte     2.80         0.91-4.28 x103/uL                      Monocytes      0.33         0.12-0.92 x103/uL                      Eosinophil     0.08         0.00-0.57 x103/uL                      Basophils      0.02         0.00-0.20 x103/uL                      Neutrophil     41.6         40.5-75.0 %                      Lymphocyte     50.7    H    15.4-48.5%                      Monocytes      5.9          2.6-10.1 %                      Eosinophil     1.5          0.0-6.8 %                      Basophils      0.3          0.0-2.0 %  Platelets      321          140-400 x103/uL    RETICULOCYTES                      Retic %        1.4          0.6-2.5 %                      Retic Abs      0.060        0.030-0.120 x106/uL   ANC                      ANC            2.29         1.96-7.23 x103/uL                    HBA1C                      Fast HbA1c     6.2           <6.5%     [id number] LPA COLLECTION D/T                      Untimed D      24-Dec-2022                        Untimed T      13:29                            SM/PLASMA PROTEO & BMS D/T                      Untimed D      24-Dec-2022                        Untimed T      13:29                            SM/WB DNA COLLECTION D/T                      Untimed D      24-Dec-2022                        Untimed T      13:29                            SM/WB PAXGENE RNA COLLECT D/T                      Untimed D      24-Dec-2022                        Untimed T      13:29                            SUBJECT FASTED AT LEAST 9 HRS?  Pt fast 9h     No              Participant ID: [id number]  LP(a) Result: 218.9 nmol/L  Date Resulted:  February 03, 2023  Date of Coaching: December 30,2024   [x]  (Please check once complete) Discussed Lp(a) result with participant:    What is Lipoprotein(a) Lp(a)?  What is a "normal" Lp(a) level, and when should I be concerned?  How is Lp(a) related to "bad cholesterol?"  Does a high Lp(a) level increase my risk of heart disease?  How are Lp(a) levels inherited?  Do Lp(a) levels vary by race or ethnicity?  Diagnosing High Lp(a)  What are the signs of high Lp(a)?  How to get an Lp(a) test?  What Lp(a) results are considered high? How do I get a diagnosis of elevated lipoprotein(a)?  How can I lower my Lp(a)?    During the return of results coaching session, all the topics listed above were reviewed with the participant as per the documents: Guidance for Clinician-Patient Lp(a) Risk Assessment Discussion African American Heart Study and Micro-Learning Session: High Lipoprotein(a) 101.  Participant verbalized understanding of test results and what to do next: to ask their healthcare practitioner to test Lp(a) level, advise first-degree relatives to be screened, and work to reduce all cardiovascular risk factors within their control,  especially LDL cholesterol.

## 2022-12-25 ENCOUNTER — Encounter (HOSPITAL_COMMUNITY)
Admission: RE | Admit: 2022-12-25 | Discharge: 2022-12-25 | Disposition: A | Discharge status: Home / Self Care | Payer: 59 | Source: Ambulatory Visit | Attending: Cardiovascular Disease | Admitting: Cardiovascular Disease

## 2022-12-25 DIAGNOSIS — Z955 Presence of coronary angioplasty implant and graft: Secondary | ICD-10-CM | POA: Insufficient documentation

## 2022-12-25 DIAGNOSIS — Z48812 Encounter for surgical aftercare following surgery on the circulatory system: Secondary | ICD-10-CM | POA: Insufficient documentation

## 2022-12-25 DIAGNOSIS — I214 Non-ST elevation (NSTEMI) myocardial infarction: Secondary | ICD-10-CM | POA: Diagnosis present

## 2022-12-25 DIAGNOSIS — I252 Old myocardial infarction: Secondary | ICD-10-CM | POA: Diagnosis not present

## 2022-12-28 ENCOUNTER — Encounter (HOSPITAL_COMMUNITY): Admission: RE | Admit: 2022-12-28 | Payer: 59 | Source: Ambulatory Visit

## 2022-12-28 DIAGNOSIS — Z48812 Encounter for surgical aftercare following surgery on the circulatory system: Secondary | ICD-10-CM | POA: Diagnosis not present

## 2022-12-28 DIAGNOSIS — I214 Non-ST elevation (NSTEMI) myocardial infarction: Secondary | ICD-10-CM

## 2022-12-28 DIAGNOSIS — Z955 Presence of coronary angioplasty implant and graft: Secondary | ICD-10-CM

## 2022-12-29 ENCOUNTER — Ambulatory Visit: Payer: 59 | Admitting: Nurse Practitioner

## 2022-12-30 ENCOUNTER — Encounter (HOSPITAL_COMMUNITY)
Admission: RE | Admit: 2022-12-30 | Discharge: 2022-12-30 | Disposition: A | Discharge status: Home / Self Care | Payer: 59 | Source: Ambulatory Visit | Attending: Cardiovascular Disease | Admitting: Cardiovascular Disease

## 2022-12-30 DIAGNOSIS — I214 Non-ST elevation (NSTEMI) myocardial infarction: Secondary | ICD-10-CM

## 2022-12-30 DIAGNOSIS — Z955 Presence of coronary angioplasty implant and graft: Secondary | ICD-10-CM

## 2022-12-30 DIAGNOSIS — Z48812 Encounter for surgical aftercare following surgery on the circulatory system: Secondary | ICD-10-CM | POA: Diagnosis not present

## 2023-01-01 ENCOUNTER — Encounter (HOSPITAL_COMMUNITY)
Admission: RE | Admit: 2023-01-01 | Discharge: 2023-01-01 | Disposition: A | Discharge status: Home / Self Care | Payer: 59 | Source: Ambulatory Visit | Attending: Cardiovascular Disease | Admitting: Cardiovascular Disease

## 2023-01-01 VITALS — Ht 64.0 in | Wt 214.7 lb

## 2023-01-01 DIAGNOSIS — Z955 Presence of coronary angioplasty implant and graft: Secondary | ICD-10-CM

## 2023-01-01 DIAGNOSIS — I214 Non-ST elevation (NSTEMI) myocardial infarction: Secondary | ICD-10-CM

## 2023-01-01 DIAGNOSIS — Z48812 Encounter for surgical aftercare following surgery on the circulatory system: Secondary | ICD-10-CM | POA: Diagnosis not present

## 2023-01-04 ENCOUNTER — Encounter (HOSPITAL_COMMUNITY)
Admission: RE | Admit: 2023-01-04 | Discharge: 2023-01-04 | Disposition: A | Discharge status: Home / Self Care | Payer: 59 | Source: Ambulatory Visit | Attending: Cardiovascular Disease | Admitting: Cardiovascular Disease

## 2023-01-04 DIAGNOSIS — Z48812 Encounter for surgical aftercare following surgery on the circulatory system: Secondary | ICD-10-CM | POA: Diagnosis not present

## 2023-01-04 DIAGNOSIS — I214 Non-ST elevation (NSTEMI) myocardial infarction: Secondary | ICD-10-CM

## 2023-01-04 DIAGNOSIS — Z955 Presence of coronary angioplasty implant and graft: Secondary | ICD-10-CM

## 2023-01-06 ENCOUNTER — Encounter (HOSPITAL_COMMUNITY)
Admission: RE | Admit: 2023-01-06 | Discharge: 2023-01-06 | Disposition: A | Discharge status: Home / Self Care | Payer: 59 | Source: Ambulatory Visit | Attending: Cardiovascular Disease | Admitting: Cardiovascular Disease

## 2023-01-06 DIAGNOSIS — Z48812 Encounter for surgical aftercare following surgery on the circulatory system: Secondary | ICD-10-CM | POA: Diagnosis not present

## 2023-01-06 DIAGNOSIS — Z955 Presence of coronary angioplasty implant and graft: Secondary | ICD-10-CM

## 2023-01-06 DIAGNOSIS — I214 Non-ST elevation (NSTEMI) myocardial infarction: Secondary | ICD-10-CM

## 2023-01-08 ENCOUNTER — Encounter (HOSPITAL_COMMUNITY)
Admission: RE | Admit: 2023-01-08 | Discharge: 2023-01-08 | Disposition: A | Discharge status: Home / Self Care | Payer: 59 | Source: Ambulatory Visit | Attending: Cardiovascular Disease | Admitting: Cardiovascular Disease

## 2023-01-08 DIAGNOSIS — Z48812 Encounter for surgical aftercare following surgery on the circulatory system: Secondary | ICD-10-CM | POA: Diagnosis not present

## 2023-01-08 DIAGNOSIS — Z955 Presence of coronary angioplasty implant and graft: Secondary | ICD-10-CM

## 2023-01-08 DIAGNOSIS — I214 Non-ST elevation (NSTEMI) myocardial infarction: Secondary | ICD-10-CM

## 2023-01-08 NOTE — Progress Notes (Signed)
Discharge Progress Report  Patient Details  Name: [name] MRN: [id number] Date of Birth: 17-Feb-1966 Referring Provider:   Flowsheet Row INTENSIVE CARDIAC REHAB ORIENT from 10/12/2022 in [facility]  Referring Provider Verne Carrow, MD        Number of Visits: 61  Reason for Discharge:  Patient reached a stable level of exercise. Patient independent in their exercise. Patient has met program and personal goals.  Smoking History:  Social History   Tobacco Use  Smoking Status Every Day   Current packs/day: 0.50   Average packs/day: 0.5 packs/day for 30.0 years (15.0 ttl pk-yrs)   Types: Cigarettes  Smokeless Tobacco Never    Diagnosis:  09/15/22 DES LAD  09/14/22 NSTEMI (non-ST elevated myocardial infarction) [facility])  ADL [facility]SD:   Initial Exercise Prescription:  Initial Exercise Prescription - 10/12/22 1300       Date of Initial Exercise RX and Referring Provider   Date 10/12/22    Referring Provider Verne Carrow, MD    Expected Discharge Date 12/25/22      NuStep   Level 1    SPM 65    Minutes 15    METs 1.5      Prescription Details   Frequency (times per week) 3    Duration Progress to 30 minutes of continuous aerobic without signs/symptoms of physical distress      Intensity   THRR 40-80% of Max Heartrate 64-129    Ratings of Perceived Exertion 11-13    Perceived Dyspnea 0-4      Progression   Progression Continue progressive overload as per policy without signs/symptoms or physical distress.      Resistance Training   Training Prescription Yes    Weight 2    Reps 10-15             Discharge Exercise Prescription (Final Exercise Prescription Changes):  Exercise Prescription Changes - 01/08/23 1650       Response to Exercise   Blood Pressure (Admit) 134/86    Blood Pressure (Exercise) 154/86    Blood Pressure (Exit) 134/86    Heart Rate (Admit) 67 bpm     Heart Rate (Exercise) 93 bpm    Heart Rate (Exit) 66 bpm    Rating of Perceived Exertion (Exercise) 11    Perceived Dyspnea (Exercise) 0    Symptoms none    Comments Pt graduated the [employer] program    Duration Progress to 30 minutes of  aerobic without signs/symptoms of physical distress    Intensity THRR unchanged      Progression   Progression Continue to progress workloads to maintain intensity without signs/symptoms of physical distress.    Average METs 2.1      Resistance Training   Training Prescription Yes    Weight 2    Reps 10-15    Time 10 Minutes      Interval Training   Interval Training No      NuStep   Level 2    SPM 109    Minutes 30    METs 2.1      Home Exercise Plan   Plans to continue exercise at [facility] (comment)    Frequency Add 4 additional days to program exercise sessions.    Initial Home Exercises Provided 01/08/23             Functional Capacity:  6 Minute Walk     Row Name 10/12/22 1343  01/01/23 1636       6 Minute Walk   Phase Initial Discharge  Pt used rolling walker    Distance 480 feet 975 feet    Distance % Change -- 103.3 %    Distance Feet Change -- 460 ft    Walk Time 6 minutes 6 minutes    # of Rest Breaks 1  4:30-6:00, pt became lightheaded 0    MPH 0.91 1.85    METS 1.4 2.32    RPE 11 11    Perceived Dyspnea  0 0    VO2 Peak 4.92 8.14    Symptoms Yes (comment) No    Comments pt became lightheaded at min 4:30, had not eaten prior to visit. Sat down, given banana and lemon muffin, resolved. No pain. --    Resting HR 60 bpm 60 bpm    Resting BP 124/82 114/64    Resting Oxygen Saturation  98 % --    Exercise Oxygen Saturation  during 6 min walk 99 % --    Max Ex. HR 79 bpm 89 bpm    Max Ex. BP 136/88 130/80    2 Minute Post BP 128/86 --             Psychological, QOL, Others - Outcomes: PHQ 2/9:    01/07/2023    3:58 PM 10/12/2022    1:42 PM 03/29/2019    2:17 PM  Depression screen PHQ 2/9   Decreased Interest 0 0 0  Down, Depressed, Hopeless 0 0 0  PHQ - 2 Score 0 0 0  Altered sleeping 0 0   Tired, decreased energy 0 1   Change in appetite 0 0   Feeling bad or failure about yourself  0 0   Trouble concentrating 0 0   Moving slowly or fidgety/restless 0 0   Suicidal thoughts 0 0   PHQ-9 Score 0 1   Difficult doing work/chores  Not difficult at all     Quality of Life:  Quality of Life - 01/04/23 1744       Quality of Life   Select Quality of Life      Quality of Life Scores   Health/Function Pre 23.6 %    Health/Function Post 25.13 %    Health/Function % Change 6.48 %    Socioeconomic Pre 25.17 %    Socioeconomic Post 23.5 %    Socioeconomic % Change  -6.63 %    Psych/Spiritual Pre 24.86 %    Psych/Spiritual Post 29.14 %    Psych/Spiritual % Change 17.22 %    Family Pre 28.5 %    Family Post 22.6 %    Family % Change -20.7 %    GLOBAL Pre 24.78 %    GLOBAL Post 25.25 %    GLOBAL % Change 1.9 %             Personal Goals: Goals established at orientation with interventions provided to work toward goal.  Personal Goals and Risk Factors at Admission - 10/12/22 1040       Core Components/Risk Factors/Patient Goals on Admission    Weight Management Yes;Obesity;Weight Loss    Intervention Weight Management: Develop a combined nutrition and exercise program designed to reach desired caloric intake, while maintaining appropriate intake of nutrient and fiber, sodium and fats, and appropriate energy expenditure required for the weight goal.;Weight Management: Provide education and appropriate resources to help participant work on and attain dietary goals.;Weight Management/Obesity: Establish reasonable short term and  long term weight goals.;Obesity: Provide education and appropriate resources to help participant work on and attain dietary goals.    Expected Outcomes Short Term: Continue to assess and modify interventions until short term weight is  achieved;Long Term: Adherence to nutrition and physical activity/exercise program aimed toward attainment of established weight goal;Weight Loss: Understanding of general recommendations for a balanced deficit meal plan, which promotes 1-2 lb weight loss per week and includes a negative energy balance of [phone] kcal/d;Understanding recommendations for meals to include 15-35% energy as protein, 25-35% energy from fat, 35-60% energy from carbohydrates, less than 200mg  of dietary cholesterol, 20-35 gm of total fiber daily;Understanding of distribution of calorie intake throughout the day with the consumption of 4-5 meals/snacks    Tobacco Cessation Yes    Number of packs per day 3 cig/ day    Intervention Assist the participant in steps to quit. Provide individualized education and counseling about committing to Tobacco Cessation, relapse prevention, and pharmacological support that can be provided by physician.;Education officer, environmental, assist with locating and accessing local/national Quit Smoking programs, and support quit date choice.    Expected Outcomes Short Term: Will demonstrate readiness to quit, by selecting a quit date.;Long Term: Complete abstinence from all tobacco products for at least 12 months from quit date.;Short Term: Will quit all tobacco product use, adhering to prevention of relapse plan.    Hypertension Yes    Intervention Provide education on lifestyle modifcations including regular physical activity/exercise, weight management, moderate sodium restriction and increased consumption of fresh fruit, vegetables, and low fat dairy, alcohol moderation, and smoking cessation.;Monitor prescription use compliance.    Expected Outcomes Long Term: Maintenance of blood pressure at goal levels.;Short Term: Continued assessment and intervention until BP is < 140/73mm HG in hypertensive participants. < 130/83mm HG in hypertensive participants with diabetes, heart failure or chronic kidney  disease.    Lipids Yes    Intervention Provide education and support for participant on nutrition & aerobic/resistive exercise along with prescribed medications to achieve LDL 70mg , HDL >40mg .    Expected Outcomes Short Term: Participant states understanding of desired cholesterol values and is compliant with medications prescribed. Participant is following exercise prescription and nutrition guidelines.;Long Term: Cholesterol controlled with medications as prescribed, with individualized exercise RX and with personalized nutrition plan. Value goals: LDL < 70mg , HDL > 40 mg.    Stress Yes    Intervention Offer individual and/or small group education and counseling on adjustment to heart disease, stress management and health-related lifestyle change. Teach and support self-help strategies.;Refer participants experiencing significant psychosocial distress to appropriate mental health specialists for further evaluation and treatment. When possible, include family members and significant others in education/counseling sessions.    Expected Outcomes Short Term: Participant demonstrates changes in health-related behavior, relaxation and other stress management skills, ability to obtain effective social support, and compliance with psychotropic medications if prescribed.;Long Term: Emotional wellbeing is indicated by absence of clinically significant psychosocial distress or social isolation.              Personal Goals Discharge:  Goals and Risk Factor Review     Row Name 11/19/22 1115 12/23/22 1452 01/07/23 1612         Core Components/Risk Factors/Patient Goals Review   Personal Goals Review Weight Management/Obesity;Hypertension;Tobacco Cessation;Lipids;Stress Weight Management/Obesity;Hypertension;Tobacco Cessation;Lipids;Stress Weight Management/Obesity;Hypertension;Tobacco Cessation;Lipids;Stress     Review [name] has been doing well with exercise at intensive cardiac rehab for his fitness  level. Vital signs have been stable. Will continue to encourage smoking  cessation [name] has been doing well with exercise at intensive cardiac rehab for her fitness level. Vital signs have been stable. Will continue to encourage smoking cessation. [name] will complete cardiac rehab on 01/08/23 [name] graduated today, 01/08/2023 from the Intensive/Traditional CR program. Core component progressing for weight loss. [name] has lost ~3 pounds during the program with healthy choices and lifestyle modification. She met her goal for blood pressure control. [name]'s BPs have been WNL while at rehab and she is compliant with meds. Goal not met for tobacco cessation. [name] has not quit smoking. She reports smoking 3 cigarettes/day. Goal is progressing for lipid control. Lipid profile WNL but cardiology wants LDL<55. [name]'s LDL is currently 75. [name] is compliant with taking her lipid medication and her MD recently added Zetia. Goal met for decreased stress. [name] enjoys singing and dancing, attending church, spending time with her "huge" family, painting, crafting, and cooking for stress relief. Post-graduation [name] plans to continue her exercise at [employer] with her family members. We are proud of the success [name] has made in the program!     Expected Outcomes [name] will continue to participate in intensive cardiac rehab for exercise, nutrition and lifestyle modifications [name] will continue to participate in intensive cardiac rehab for exercise, nutrition and lifestyle modifications [name] will continue to exercise, modify her nutrition and lifestyle post CR graduation              Exercise Goals and Review:  Exercise Goals     Row Name 10/12/22 1039             Exercise Goals   Increase Physical Activity Yes       Intervention Provide advice, education, support and counseling about physical activity/exercise needs.;Develop an individualized exercise prescription for aerobic and  resistive training based on initial evaluation findings, risk stratification, comorbidities and participant's personal goals.       Expected Outcomes Short Term: Attend rehab on a regular basis to increase amount of physical activity.;Long Term: Exercising regularly at least 3-5 days a week.;Long Term: Add in home exercise to make exercise part of routine and to increase amount of physical activity.       Increase Strength and Stamina Yes       Intervention Provide advice, education, support and counseling about physical activity/exercise needs.;Develop an individualized exercise prescription for aerobic and resistive training based on initial evaluation findings, risk stratification, comorbidities and participant's personal goals.       Expected Outcomes Short Term: Increase workloads from initial exercise prescription for resistance, speed, and METs.;Short Term: Perform resistance training exercises routinely during rehab and add in resistance training at home;Long Term: Improve cardiorespiratory fitness, muscular endurance and strength as measured by increased METs and functional capacity ( )       Able to understand and use rate of perceived exertion (RPE) scale Yes       Intervention Provide education and explanation on how to use RPE scale       Expected Outcomes Short Term: Able to use RPE daily in rehab to express subjective intensity level;Long Term:  Able to use RPE to guide intensity level when exercising independently       Knowledge and understanding of Target Heart Rate Range (THRR) Yes       Intervention Provide education and explanation of THRR including how the numbers were predicted and where they are located for reference       Expected Outcomes Short Term: Able to state/look up THRR;Short Term: Able  to use daily as guideline for intensity in rehab;Long Term: Able to use THRR to govern intensity when exercising independently       Understanding of Exercise Prescription Yes        Intervention Provide education, explanation, and written materials on patient's individual exercise prescription       Expected Outcomes Short Term: Able to explain program exercise prescription;Long Term: Able to explain home exercise prescription to exercise independently                Exercise Goals Re-Evaluation:  Exercise Goals Re-Evaluation     Row Name 10/21/22 1643 11/06/22 1702 12/14/22 1722 01/08/23 1652       Exercise Goal Re-Evaluation   Exercise Goals Review Increase Physical Activity;Understanding of Exercise Prescription;Increase Strength and Stamina;Knowledge and understanding of Target Heart Rate Range (THRR);Able to understand and use rate of perceived exertion (RPE) scale Increase Physical Activity;Understanding of Exercise Prescription;Increase Strength and Stamina;Knowledge and understanding of Target Heart Rate Range (THRR);Able to understand and use rate of perceived exertion (RPE) scale Increase Physical Activity;Understanding of Exercise Prescription;Increase Strength and Stamina;Knowledge and understanding of Target Heart Rate Range (THRR);Able to understand and use rate of perceived exertion (RPE) scale Increase Physical Activity;Understanding of Exercise Prescription;Increase Strength and Stamina;Knowledge and understanding of Target Heart Rate Range (THRR);Able to understand and use rate of perceived exertion (RPE) scale    Comments Pt first day in CRP2 program. Pt tolerated exercise well with an avg MET level of 1.6. Pt c/o 3/10 rt chronic knee pain but was able to tolerate the nustep without issue. Pt understands her ExRx, is learning her RPE scale and THRR. Off to a great start. Reviewed METs and goals with pt. Pt is tolerating exercise well with an avg MET level of 1.8. Discussed with pt goal of 30 min of exercise in CRP2, pt is eager to try 30 min on nustep next session. Also discussed increasing WL, which we will do once pt able to tolerate 30 min. Pt has been  working on her goals of diet information and wt loss by speaking with RD regularly and feels good about progress. She has not lost wt, but is learning a lot. She feels good about learning how to modify exercise so that it's not painful and feels very good about her improvement of strength and stamina. Pt states that she overall is feeling much better and enjoying program. Reviewed METs and goals with pt. Pt is tolerating exercise well with an avg MET level of 1.8. Pt is feeling good about her goals. She has been able to increase her time to 30 mins and has increased her resistance. Overall pt doing well, but is still limited by fatigue and pain. She states she does have a PF membership, but just ICR is a lot for her right now. Will continue to increase WL as able Pt graduated the [employer] today. Pt tolerated exercise well with an average MET level of 2.1. Pt feels good about her progress and increased her distance by 422ft to a total of 921ft on her post (this is a 103.3% increase). Pt will continue to exercise on her own by  going to [employer] and [employer] videos for tai chi and chair fitness for 3-5 days for 30-45 mins per session.    Expected Outcomes Will continue to progress workloads as tolerated without s/sx. Will continue to progress workloads as tolerated, pt will achieve 30 min of continuous exercise. Will continue to progress workloads  as tolerated, pt will achieve 30 min of continuous exercise. Pt will continue to exercise on her own and gain strength.             Nutrition & Weight - Outcomes:  Pre Biometrics - 10/12/22 1056       Pre Biometrics   Waist Circumference 45.5 inches    Hip Circumference 50.5 inches    Waist to Hip Ratio 0.9 %    Triceps Skinfold 42 mm    % Body Fat 49.1 %    Grip Strength 20 kg    Flexibility 0 in   not done due to low back issues   Single Leg Stand 1.5 seconds             Post Biometrics - 01/01/23 1642        Post   Biometrics   Height 5\' 4"  (1.626 m)    Weight 97.4 kg    Waist Circumference 40.25 inches    Hip Circumference 50.25 inches    Waist to Hip Ratio 0.8 %    BMI (Calculated) 36.84    Triceps Skinfold 31 mm    % Body Fat 45.5 %    Grip Strength 28 kg    Flexibility --   Did not attempt   Single Leg Stand 2.7 seconds             Nutrition:  Nutrition Therapy & Goals - 01/08/23 1509       Nutrition Therapy   Diet Heart healthy diet    Drug/Food Interactions Statins/Certain Fruits      Personal Nutrition Goals   Nutrition Goal Patient to identify strategies for reducing cardiovascular risk by attending the [employer] education and nutrition series weekly.   Goal in action.   Personal Goal #2 Patient to improve diet quality by using the plate method as a guide for meal planning to include lean protein/plant protein, fruits, vegetables, whole grains, nonfat dairy as part of a well-balanced diet.   Goal in action.   Personal Goal #3 Patient to reduce sodium intake to 1500mg  per day   Goal in action.   Comments [employer] today. Goals in action. [name] has attended the [employer] and nutrition series regularly. She has started making many dietary changes including reduced sodium, increased dietary fiber, and decreased saturated fat intake. She has improved understanding of reading food labels. She has maintained her weight since starting with our program. Her sons continue to be a good support toward lifestyle changes. Patient will benefit from participation in intensive cardiac rehab for nutrition, exercise, and lifestyle modification.      Intervention Plan   Intervention Prescribe, educate and counsel regarding individualized specific dietary modifications aiming towards targeted core components such as weight, hypertension, lipid management, diabetes, heart failure and other comorbidities.;Nutrition handout(s) given to patient.    Expected Outcomes Short Term Goal:  Understand basic principles of dietary content, such as calories, fat, sodium, cholesterol and nutrients.;Long Term Goal: Adherence to prescribed nutrition plan.             Nutrition Discharge:  Nutrition Assessments - 01/11/23 0956       Rate Your Plate Scores   Post Score 74             Education Questionnaire Score:  Knowledge Questionnaire Score - 01/04/23 1744       Knowledge Questionnaire Score   Pre Score 26/28    Post Score 27/28  [name] graduated today, 01/08/2023 from the Intensive/Traditional CR program completing 53 exercise and education sessions. Goals reviewed with patient; copy given to patient. Pt maintained good attendance and progressed during her participation in rehab as evidenced by increased METs and workload. Initial PHQ-2/PHQ-9 score 0/1, post scores 0/0. Pt states her mental health is stable. RN reviewed her Quality-of-Life assessment, overall score increased from 24.78 to 25.25. Scores well above 19 indicating a positive quality of life. No areas of concern. Pt declines any needs at the time of graduation. RN also reviewed and updated medication list. Core component progressing for weight loss. [name] has lost ~3 pounds during the program with healthy choices and lifestyle modification. She met her goal for blood pressure control. [name]'s BPs have been WNL while at rehab and she is compliant with meds. Goal not met for tobacco cessation. [name] has not quit smoking. She reports smoking 3 cigarettes/day. Goal is progressing for lipid control. Lipid profile WNL but cardiology wants LDL<55. [name]'s LDL is currently 75. [name] is compliant with taking her lipid medication and her MD recently added Zetia. Goal met for decreased stress. Destanie enjoys singing and dancing, attending church, spending time with her "huge" family, painting, crafting, and cooking for stress relief. Post-graduation Hendy plans to continue her exercise at [employer]  with her family members. We are proud of the success [facility] has made in the program!

## 2023-01-26 ENCOUNTER — Other Ambulatory Visit: Payer: Self-pay | Admitting: Internal Medicine

## 2023-01-26 DIAGNOSIS — Z1231 Encounter for screening mammogram for malignant neoplasm of breast: Secondary | ICD-10-CM

## 2023-02-03 ENCOUNTER — Other Ambulatory Visit: Payer: Self-pay | Admitting: Cardiology

## 2023-02-11 ENCOUNTER — Ambulatory Visit
Admission: RE | Admit: 2023-02-11 | Discharge: 2023-02-11 | Disposition: A | Discharge status: Home / Self Care | Payer: 59 | Source: Ambulatory Visit | Attending: Internal Medicine | Admitting: Internal Medicine

## 2023-02-11 ENCOUNTER — Other Ambulatory Visit: Payer: Self-pay

## 2023-02-11 DIAGNOSIS — Z1231 Encounter for screening mammogram for malignant neoplasm of breast: Secondary | ICD-10-CM

## 2023-02-11 MED ORDER — CLOPIDOGREL BISULFATE 75 MG PO TABS: 75.0000 mg | ORAL_TABLET | Freq: Every day | ORAL | 1 refills | Status: DC

## 2023-02-11 MED ORDER — ATORVASTATIN CALCIUM 80 MG PO TABS: 80.0000 mg | ORAL_TABLET | Freq: Every day | ORAL | 1 refills | Status: DC

## 2023-02-11 NOTE — Telephone Encounter (Signed)
The patient requested refills on her metoprolol, atorvastatin, and clopidogrel sent to [facility] on [address].  The patient was notified that the metoprolol was just sent on the 12 th and that her other requested refills are being faxed over.

## 2023-02-19 ENCOUNTER — Ambulatory Visit: Payer: 59 | Attending: Nurse Practitioner

## 2023-02-19 DIAGNOSIS — Z79899 Other long term (current) drug therapy: Secondary | ICD-10-CM

## 2023-02-19 LAB — LIPID PANEL
Chol/HDL Ratio: 3.1 {ratio} (ref 0.0–4.4)
Cholesterol, Total: 111 mg/dL (ref 100–199)
HDL: 36 mg/dL — ABNORMAL LOW (ref >39)
LDL Chol Calc (NIH): 55 mg/dL (ref 0–99)
Triglycerides: 104 mg/dL (ref 0–149)
VLDL Cholesterol Cal: 20 mg/dL (ref 5–40)

## 2023-02-19 LAB — ALT: ALT: 21 [IU]/L (ref 0–32)

## 2023-02-23 ENCOUNTER — Emergency Department (HOSPITAL_COMMUNITY): Payer: 59

## 2023-02-23 ENCOUNTER — Emergency Department (HOSPITAL_COMMUNITY)
Admission: EM | Admit: 2023-02-23 | Discharge: 2023-02-23 | Disposition: A | Discharge status: Home / Self Care | Payer: 59 | Attending: Emergency Medicine | Admitting: Emergency Medicine

## 2023-02-23 ENCOUNTER — Telehealth: Payer: Self-pay | Admitting: Nurse Practitioner

## 2023-02-23 ENCOUNTER — Other Ambulatory Visit: Payer: Self-pay

## 2023-02-23 DIAGNOSIS — E860 Dehydration: Secondary | ICD-10-CM | POA: Insufficient documentation

## 2023-02-23 DIAGNOSIS — I10 Essential (primary) hypertension: Secondary | ICD-10-CM | POA: Diagnosis not present

## 2023-02-23 DIAGNOSIS — E119 Type 2 diabetes mellitus without complications: Secondary | ICD-10-CM | POA: Diagnosis not present

## 2023-02-23 DIAGNOSIS — R1084 Generalized abdominal pain: Secondary | ICD-10-CM | POA: Insufficient documentation

## 2023-02-23 DIAGNOSIS — Z79899 Other long term (current) drug therapy: Secondary | ICD-10-CM | POA: Insufficient documentation

## 2023-02-23 LAB — URINALYSIS, ROUTINE W REFLEX MICROSCOPIC
Bilirubin Urine: NEGATIVE
Glucose, UA: NEGATIVE mg/dL
Hgb urine dipstick: NEGATIVE
Ketones, ur: 80 mg/dL — AB
Nitrite: NEGATIVE
Protein, ur: NEGATIVE mg/dL
Specific Gravity, Urine: >1.046 — ABNORMAL HIGH (ref 1.005–1.030)
pH: 6 (ref 5.0–8.0)

## 2023-02-23 LAB — COMPREHENSIVE METABOLIC PANEL
ALT: 20 U/L (ref 0–44)
AST: 18 U/L (ref 15–41)
Albumin: 3.2 g/dL — ABNORMAL LOW (ref 3.5–5.0)
Alkaline Phosphatase: 83 U/L (ref 38–126)
Anion gap: 15 (ref 5–15)
BUN: 19 mg/dL (ref 6–20)
CO2: 21 mmol/L — ABNORMAL LOW (ref 22–32)
Calcium: 8.6 mg/dL — ABNORMAL LOW (ref 8.9–10.3)
Chloride: 104 mmol/L (ref 98–111)
Creatinine, Ser: 1.07 mg/dL — ABNORMAL HIGH (ref 0.44–1.00)
GFR, Estimated: >60 mL/min (ref >60)
Glucose, Bld: 73 mg/dL (ref 70–99)
Potassium: 3.7 mmol/L (ref 3.5–5.1)
Sodium: 140 mmol/L (ref 135–145)
Total Bilirubin: 0.6 mg/dL (ref 0.3–1.2)
Total Protein: 7 g/dL (ref 6.5–8.1)

## 2023-02-23 LAB — CBC WITH DIFFERENTIAL/PLATELET
Abs Immature Granulocytes: 0.02 10*3/uL (ref 0.00–0.07)
Basophils Absolute: 0 10*3/uL (ref 0.0–0.1)
Basophils Relative: 0 %
Eosinophils Absolute: 0 10*3/uL (ref 0.0–0.5)
Eosinophils Relative: 0 %
HCT: 39.3 % (ref 36.0–46.0)
Hemoglobin: 13 g/dL (ref 12.0–15.0)
Immature Granulocytes: 0 %
Lymphocytes Relative: 33 %
Lymphs Abs: 2.3 10*3/uL (ref 0.7–4.0)
MCH: 27.9 pg (ref 26.0–34.0)
MCHC: 33.1 g/dL (ref 30.0–36.0)
MCV: 84.3 fL (ref 80.0–100.0)
Monocytes Absolute: 0.4 10*3/uL (ref 0.1–1.0)
Monocytes Relative: 6 %
Neutro Abs: 4.3 10*3/uL (ref 1.7–7.7)
Neutrophils Relative %: 61 %
Platelets: 406 10*3/uL — ABNORMAL HIGH (ref 150–400)
RBC: 4.66 MIL/uL (ref 3.87–5.11)
RDW: 13.9 % (ref 11.5–15.5)
WBC: 7.1 10*3/uL (ref 4.0–10.5)
nRBC: 0 % (ref 0.0–0.2)

## 2023-02-23 LAB — LIPASE, BLOOD: Lipase: 20 U/L (ref 11–51)

## 2023-02-23 LAB — TROPONIN I (HIGH SENSITIVITY)
Troponin I (High Sensitivity): 9 ng/L (ref <18)
Troponin I (High Sensitivity): 9 ng/L (ref <18)

## 2023-02-23 MED ORDER — HYDROMORPHONE HCL 1 MG/ML IJ SOLN
1.0000 mg | Freq: Once | INTRAMUSCULAR | Status: AC
  Administered 2023-02-23: 1 mg via INTRAVENOUS
  Filled 2023-02-23: qty 1

## 2023-02-23 MED ORDER — FENTANYL CITRATE PF 50 MCG/ML IJ SOSY
50.0000 ug | PREFILLED_SYRINGE | Freq: Once | INTRAMUSCULAR | Status: AC
  Administered 2023-02-23: 50 ug via INTRAVENOUS
  Filled 2023-02-23: qty 1

## 2023-02-23 MED ORDER — ONDANSETRON HCL 4 MG/2ML IJ SOLN
4.0000 mg | Freq: Once | INTRAMUSCULAR | Status: AC
  Administered 2023-02-23: 4 mg via INTRAVENOUS
  Filled 2023-02-23: qty 2

## 2023-02-23 MED ORDER — IOHEXOL 350 MG/ML SOLN
75.0000 mL | Freq: Once | INTRAVENOUS | Status: AC | PRN
  Administered 2023-02-23: 75 mL via INTRAVENOUS

## 2023-02-23 MED ORDER — HYDROCODONE-ACETAMINOPHEN 5-325 MG PO TABS: 1.0000 | ORAL_TABLET | Freq: Once | ORAL | Status: DC

## 2023-02-23 MED ORDER — FAMOTIDINE IN NACL 20-0.9 MG/50ML-% IV SOLN
20.0000 mg | Freq: Once | INTRAVENOUS | Status: AC
  Administered 2023-02-23: 20 mg via INTRAVENOUS
  Filled 2023-02-23: qty 50

## 2023-02-23 MED ORDER — SODIUM CHLORIDE 0.9 % IV BOLUS
1000.0000 mL | Freq: Once | INTRAVENOUS | Status: AC
  Administered 2023-02-23: 1000 mL via INTRAVENOUS

## 2023-02-23 MED ORDER — SODIUM CHLORIDE 0.9 % IV SOLN: INTRAVENOUS | Status: DC

## 2023-02-23 MED ORDER — ONDANSETRON 4 MG PO TBDP: 4.0000 mg | ORAL_TABLET | Freq: Three times a day (TID) | ORAL | 0 refills | Status: AC | PRN

## 2023-02-23 NOTE — ED Provider Notes (Signed)
[facility] [place] [facility] Provider Note   CSN: [id number] Arrival date & time: 02/23/23  1507     History  Chief Complaint  Patient presents with   Abdominal Pain   Emesis    [name] is a 57 y.o. female.  HPI Patient presents with 4 days of nausea, vomiting, abdominal pain, p.o. intolerance.  Pain initially was in the abdomen diffusely, and has begun to occur in the chest as well described as tightness. History is notable for multiple medical problems including MRI this year.  She notes that she is been unable to take her medications, or achieves any relief with OTC medications throughout this illness.     Home Medications Prior to Admission medications   Medication Sig Start Date End Date Taking? Authorizing Provider  ondansetron (ZOFRAN-ODT) 4 MG disintegrating tablet Take 1 tablet (4 mg total) by mouth every 8 (eight) hours as needed for nausea or vomiting. 02/23/23  Yes Gerhard Munch, MD  amLODipine (NORVASC) 5 MG tablet Take 5 mg by mouth daily. 08/25/22   [provider]  atorvastatin (LIPITOR) 80 MG tablet Take 1 tablet (80 mg total) by mouth daily. 02/11/23   Kathleene Hazel, MD  carisoprodol (SOMA) 350 MG tablet Take 350 mg by mouth daily as needed for muscle spasms. 04/24/19   [provider]  clopidogrel (PLAVIX) 75 MG tablet Take 1 tablet (75 mg total) by mouth daily with breakfast. 02/11/23   Kathleene Hazel, MD  diclofenac Sodium (VOLTAREN) 1 % GEL Apply 1 Application topically as needed for moderate pain. 11/09/21   [provider]  ezetimibe (ZETIA) 10 MG tablet Take 1 tablet (10 mg total) by mouth daily. 12/02/22 03/02/23  Swinyer, Zachary George, NP  folic acid (FOLVITE) 400 MCG tablet Take 400 mcg by mouth daily.    [provider]  HYDROcodone-acetaminophen (NORCO) 10-325 MG tablet Take 1 tablet by mouth up to 4 (four) times daily as needed for severe pain only. 05/05/22      levETIRAcetam (KEPPRA) 500 MG tablet Take 1 tablet (500 mg total) by mouth 2 (two) times daily. 07/27/12   Richarda Overlie, MD  metoprolol tartrate (LOPRESSOR) 25 MG tablet TAKE 1 TABLET(25 MG TOTAL) BY MOUTH TWICE DAILY 02/04/23   Kathleene Hazel, MD  MOUNJARO 2.5 MG/0.5ML Pen Inject 2.5 mg into the skin once a week. 04/17/22   [provider]  nitroGLYCERIN (NITROSTAT) 0.4 MG SL tablet Place 1 tablet (0.4 mg total) under the tongue every 5 (five) minutes x 3 doses as needed for chest pain. 09/16/22   Arty Baumgartner, NP  pantoprazole (PROTONIX) 40 MG tablet Take 40 mg by mouth daily before breakfast.     [provider]  promethazine (PHENERGAN) 25 MG tablet Take 1 tablet (25 mg total) by mouth every 6 (six) hours as needed. 03/26/22   Danford, Earl Lites, MD  promethazine (PHENERGAN) 6.25 MG/5ML syrup Take 20 mLs (25 mg total) by mouth every 8 (eight) hours as needed for nausea or vomiting. 03/26/22 03/26/23  Danford, Earl Lites, MD  RELISTOR 150 MG TABS Take 3 tablets by mouth every morning. 09/03/22   [provider]  solifenacin (VESICARE) 10 MG tablet Take 10 mg by mouth daily. 12/09/21   [provider]      Allergies    Aspirin and Penicillins    Review of Systems   Review of Systems  All other systems reviewed and are negative.   Physical  Exam Updated Vital Signs BP (!) 139/97 (BP Location: Right Arm)   Pulse 90   Temp 98.5 F (36.9 C) (Oral)   Resp 15   Ht 5\' 4"  (1.626 m)   Wt 95.3 kg   SpO2 97%   BMI 36.05 kg/m  Physical Exam Vitals and nursing note reviewed.  Constitutional:      Appearance: She is well-developed. She is obese. She is ill-appearing and diaphoretic.  HENT:     Head: Normocephalic and atraumatic.  Eyes:     Conjunctiva/sclera: Conjunctivae normal.  Cardiovascular:     Rate and Rhythm: Normal rate and regular rhythm.  Pulmonary:     Effort: Pulmonary effort is normal. No respiratory distress.     Breath  sounds: Normal breath sounds. No stridor.  Abdominal:     General: There is no distension.     Tenderness: There is generalized abdominal tenderness. There is guarding.  Skin:    General: Skin is warm.  Neurological:     Mental Status: She is alert and oriented to person, place, and time.     Cranial Nerves: No cranial nerve deficit.  Psychiatric:        Mood and Affect: Mood normal.     ED Results / Procedures / Treatments   Labs (all labs ordered are listed, but only abnormal results are displayed) Labs Reviewed  CBC WITH DIFFERENTIAL/PLATELET - Abnormal; Notable for the following components:      Result Value   Platelets 406 (*)    All other components within normal limits  URINALYSIS, ROUTINE W REFLEX MICROSCOPIC - Abnormal; Notable for the following components:   Specific Gravity, Urine >1.046 (*)    Ketones, ur 80 (*)    Leukocytes,Ua LARGE (*)    Bacteria, UA FEW (*)    All other components within normal limits  COMPREHENSIVE METABOLIC PANEL - Abnormal; Notable for the following components:   CO2 21 (*)    Creatinine, Ser 1.07 (*)    Calcium 8.6 (*)    Albumin 3.2 (*)    All other components within normal limits  LIPASE, BLOOD  TROPONIN I (HIGH SENSITIVITY)  TROPONIN I (HIGH SENSITIVITY)    EKG EKG Interpretation Date/Time:  Tuesday February 23 2023 15:49:46 EDT Ventricular Rate:  92 PR Interval:  185 QRS Duration:  102 QT Interval:  386 QTC Calculation: 478 R Axis:   12  Text Interpretation: Sinus rhythm Left atrial enlargement Borderline T abnormalities, anterior leads Artifact Abnormal ECG Confirmed by Gerhard Munch [phone]) on 02/23/2023 11:06:36 PM  Radiology CT ABDOMEN PELVIS W CONTRAST  Result Date: 02/23/2023 CLINICAL DATA:  Abdominal pain EXAM: CT ABDOMEN AND PELVIS WITH CONTRAST TECHNIQUE: Multidetector CT imaging of the abdomen and pelvis was performed using the standard protocol following bolus administration of intravenous contrast. RADIATION  DOSE REDUCTION: This exam was performed according to the departmental dose-optimization program which includes automated exposure control, adjustment of the mA and/or kV according to patient size and/or use of iterative reconstruction technique. CONTRAST:  75mL OMNIPAQUE IOHEXOL 350 MG/ML SOLN COMPARISON:  03/24/2022 FINDINGS: Lower chest: Lung bases are free of acute infiltrate or sizable effusion. Hepatobiliary: No focal liver abnormality is seen. No gallstones, gallbladder wall thickening, or biliary dilatation. Pancreas: Unremarkable. No pancreatic ductal dilatation or surrounding inflammatory changes. Spleen: Normal in size without focal abnormality. Adrenals/Urinary Tract: Adrenal glands are within normal limits. Kidneys demonstrate a normal enhancement pattern bilaterally. Tiny nonobstructing stone is noted in the lower pole of the right kidney. No  obstructing changes are seen. Bladder is partially distended. Stomach/Bowel: Diverticular change of the colon is noted without evidence of diverticulitis. The appendix is within normal limits. No obstructive or inflammatory changes of the small bowel are seen. Stomach is unremarkable. Vascular/Lymphatic: Aortic atherosclerosis. No enlarged abdominal or pelvic lymph nodes. Reproductive: Status post hysterectomy. No adnexal masses. Other: No abdominal wall hernia or abnormality. No abdominopelvic ascites. Musculoskeletal: Degenerative change without acute abnormality. IMPRESSION: Tiny nonobstructing right renal stone. Diverticulosis without diverticulitis. Electronically Signed   By: Alcide Clever M.D.   On: 02/23/2023 20:24   DG Chest 2 View  Result Date: 02/23/2023 CLINICAL DATA:  Upper abdominal and lower chest pain. Generalized abdominal cramping with nausea and vomiting for 3 days. EXAM: CHEST - 2 VIEW COMPARISON:  Chest radiographs 09/14/2022 and 02/15/2019 FINDINGS: Cardiac silhouette is again mildly enlarged. Mediastinal contours are within normal limits.  The lungs are clear. No pleural effusion or pneumothorax. No acute skeletal abnormality. IMPRESSION: 1. No acute cardiopulmonary process. 2. Mild cardiomegaly. Electronically Signed   By: Neita Garnet M.D.   On: 02/23/2023 18:03    Procedures Procedures    Medications Ordered in ED Medications  ondansetron (ZOFRAN) injection 4 mg (has no administration in time range)  fentaNYL (SUBLIMAZE) injection 50 mcg (50 mcg Intravenous Given 02/23/23 1547)  ondansetron (ZOFRAN) injection 4 mg (4 mg Intravenous Given 02/23/23 1547)  famotidine (PEPCID) IVPB 20 mg premix (0 mg Intravenous Stopped 02/23/23 1832)  iohexol (OMNIPAQUE) 350 MG/ML injection 75 mL (75 mLs Intravenous Contrast Given 02/23/23 1845)  HYDROmorphone (DILAUDID) injection 1 mg (1 mg Intravenous Given 02/23/23 2135)  sodium chloride 0.9 % bolus 1,000 mL (1,000 mLs Intravenous New Bag/Given 02/23/23 2135)    ED Course/ Medical Decision Making/ A&P                                 Medical Decision Making Adult female with multiple medical problems including MI, hypertension, obesity presents with nausea, vomiting, p.o. intolerance, abdominal and chest pain.  She does have a history of C-section so some elevated risk profile for bowel obstruction though her abdomen is nondistended. Other considerations include dehydration, metabolic acidosis given her persistent vomiting with minimal p.o. intake, and given her risk profile for cardiac disease inability to tolerate p.o. is concerning. Patient received fluids, fentanyl, Zofran, CT, x-ray, labs ordered. EMS ECG with sinus rhythm 99 T wave changes abnormal  Amount and/or Complexity of Data Reviewed Independent Historian: EMS External Data Reviewed: notes. Labs: ordered. Decision-making details documented in ED Course. Radiology: ordered and independent interpretation performed. Decision-making details documented in ED Course. ECG/medicine tests: ordered and independent interpretation  performed. Decision-making details documented in ED Course.  Risk Prescription drug management.  Update: Patient substantially better on repeat exam.   11:08 PM Patient accompanied by her children.  We discussed all findings again, and have reviewed them previously with the patient.  Essence this patient with multiple medical problems including hypertension, diabetes, prior MI presents with abdominal pain, and her concern for ACS equivalent.  No evidence for ACS with 2 normal troponin, nonischemic EKG, given her abdominal discomfort some suspicion for GI versus GU pathology.  Patient CT scan reassuring, aside from small kidney stone which may be contributing to her discomfort.  Additional considerations include Mounjaro use.  No evidence of bacteremia, sepsis.  She does have some evidence for dehydration as well and received fluids for repletion here.  She has  had no additional vomiting, pain has improved markedly, and she is remained hemodynamically stable for hours. Patient has a primary care physician with whom she will follow-up.         Final Clinical Impression(s) / ED Diagnoses Final diagnoses:  Generalized abdominal pain  Dehydration    Rx / DC Orders ED Discharge Orders          Ordered    ondansetron (ZOFRAN-ODT) 4 MG disintegrating tablet  Every 8 hours PRN        02/23/23 2308              Gerhard Munch, MD 02/23/23 2308

## 2023-02-23 NOTE — ED Notes (Signed)
Patient transported to CT 

## 2023-02-23 NOTE — Discharge Instructions (Addendum)
Today's evaluation has been generally reassuring.  There is no evidence that your heart is undergoing damage.  There is some evidence for dehydration, and a kidney stone which may be contributing to your pain.  Additional considerations include your medications.  Specifically Mounjaro can cause your symptoms in some individuals.  It is important to discuss this with your physician.  Please call tomorrow for the next available appointment for follow-up. Return here for concerning changes in your condition.

## 2023-02-23 NOTE — ED Triage Notes (Signed)
Pt with three days of generalized abdominal cramping with n/v x 3 days. Unable to tolerate PO intake.

## 2023-02-23 NOTE — Telephone Encounter (Signed)
  Ms. Stroope, your cholesterol is now at goal of LDL 55 or lower with the addition of the ezetimibe.  Please continue ezetimibe and atorvastatin.  HDL can improve with regular exercise.  Please let us know if you have any questions or concerns.   The patient has been notified of the result and verbalized understanding.  All questions (if any) were answered. Frutoso Schatz, RN 02/23/2023 1:08 PM

## 2023-02-23 NOTE — Telephone Encounter (Signed)
Patient returned call for her lab results. She states she doesn't doesn't know how to get into MyChart for her message.

## 2023-05-24 ENCOUNTER — Telehealth: Payer: Self-pay | Admitting: *Deleted

## 2023-05-24 NOTE — Telephone Encounter (Signed)
Called to give patient lab results for study she is enrolled in.  Left message for patient to call me back.  Research Coordinator 05/24/2023  11:18 am

## 2023-06-08 ENCOUNTER — Ambulatory Visit: Payer: 59 | Attending: Cardiovascular Disease | Admitting: Cardiovascular Disease

## 2023-06-08 ENCOUNTER — Encounter: Payer: Self-pay | Admitting: Cardiovascular Disease

## 2023-06-08 VITALS — BP 130/74 | HR 74 | Ht 64.0 in | Wt 203.0 lb

## 2023-06-08 DIAGNOSIS — I2510 Atherosclerotic heart disease of native coronary artery without angina pectoris: Secondary | ICD-10-CM | POA: Diagnosis not present

## 2023-06-08 DIAGNOSIS — I10 Essential (primary) hypertension: Secondary | ICD-10-CM

## 2023-06-08 DIAGNOSIS — E785 Hyperlipidemia, unspecified: Secondary | ICD-10-CM | POA: Diagnosis not present

## 2023-06-08 DIAGNOSIS — Z720 Tobacco use: Secondary | ICD-10-CM

## 2023-06-08 NOTE — Progress Notes (Signed)
 Chief Complaint  Patient presents with   Follow-up    CAD   History of Present Illness: 58 yo female with history of CAD, HTN, HLD, obesity, seizure disorder here today for cardiac follow up. She was admitted to [facility] in April 2024 with a NSTEMI. Cardiac cath with severe LAD stenosis treated with a drug eluting stent. Echo April 2024 with LVEF=50-55%. Normal RV function. No significant valve She was discharged on Plavix  (ASA allergy).   She is here today for follow up. The patient denies any chest pain, dyspnea, palpitations, lower extremity edema, orthopnea, PND, dizziness, near syncope or syncope.   Primary Care Physician: Shelda Atlas, MD   Past Medical History:  Diagnosis Date   Arthritis    Carpal tunnel syndrome    chronoic muscle spasms  & back pain   Chronic back pain    Fibroids    uterine   GERD (gastroesophageal reflux disease)    Hypertension    Seizures (HCC)    last seizure 4-5 years ago/on keppra    Single stillborn delivery outcome    Smoker     Past Surgical History:  Procedure Laterality Date   ABDOMINAL HYSTERECTOMY     BACK SURGERY  02/2017   CESAREAN SECTION     1 time   CORONARY STENT INTERVENTION N/A 09/15/2022   Procedure: CORONARY STENT INTERVENTION;  Surgeon: Wonda Sharper, MD;  Location: [facility] INVASIVE CV LAB;  Service: Cardiovascular;  Laterality: N/A;   LEFT HEART CATH AND CORONARY ANGIOGRAPHY N/A 09/15/2022   Procedure: LEFT HEART CATH AND CORONARY ANGIOGRAPHY;  Surgeon: Wonda Sharper, MD;  Location: [facility] INVASIVE CV LAB;  Service: Cardiovascular;  Laterality: N/A;   LUMBAR LAMINECTOMY/DECOMPRESSION MICRODISCECTOMY Left 02/03/2017   Procedure: LEFT SIDED LUMBAR 5-SACRUM 1 MICRODISECTOMY;  Surgeon: Beuford Anes, MD;  Location: [facility] OR;  Service: Orthopedics;  Laterality: Left;  LEFT SIDED LUMBAR 5-SACRUM 1 MICRODISECTOMY;    MYOMECTOMY     TUBAL LIGATION      Current Outpatient Medications  Medication Sig Dispense Refill   amLODipine   (NORVASC ) 5 MG tablet Take 5 mg by mouth daily.     atorvastatin  (LIPITOR) 80 MG tablet Take 1 tablet (80 mg total) by mouth daily. 90 tablet 1   carisoprodol  (SOMA ) 350 MG tablet Take 350 mg by mouth daily as needed for muscle spasms.     clopidogrel  (PLAVIX ) 75 MG tablet Take 1 tablet (75 mg total) by mouth daily with breakfast. 90 tablet 1   diclofenac Sodium (VOLTAREN) 1 % GEL Apply 1 Application topically as needed for moderate pain.     folic acid  (FOLVITE ) 400 MCG tablet Take 400 mcg by mouth daily.     HYDROcodone -acetaminophen  (NORCO) 10-325 MG tablet Take 1 tablet by mouth up to 4 (four) times daily as needed for severe pain only. 120 tablet 0   levETIRAcetam  (KEPPRA ) 500 MG tablet Take 1 tablet (500 mg total) by mouth 2 (two) times daily. 60 tablet 3   metoprolol  tartrate (LOPRESSOR ) 25 MG tablet TAKE 1 TABLET(25 MG TOTAL) BY MOUTH TWICE DAILY 180 tablet 2   MOUNJARO 2.5 MG/0.5ML Pen Inject 2.5 mg into the skin once a week.     nitroGLYCERIN  (NITROSTAT ) 0.4 MG SL tablet Place 1 tablet (0.4 mg total) under the tongue every 5 (five) minutes x 3 doses as needed for chest pain. 25 tablet 2   ondansetron  (ZOFRAN -ODT) 4 MG disintegrating tablet Take 1 tablet (4 mg total) by mouth every 8 (eight) hours as needed  for nausea or vomiting. 20 tablet 0   pantoprazole  (PROTONIX ) 40 MG tablet Take 40 mg by mouth daily before breakfast.      promethazine  (PHENERGAN ) 25 MG tablet Take 1 tablet (25 mg total) by mouth every 6 (six) hours as needed. 30 tablet 0   RELISTOR 150 MG TABS Take 3 tablets by mouth every morning.     solifenacin (VESICARE) 10 MG tablet Take 10 mg by mouth daily.     ezetimibe  (ZETIA ) 10 MG tablet Take 1 tablet (10 mg total) by mouth daily. 90 tablet 3   promethazine  (PHENERGAN ) 6.25 MG/5ML syrup Take 20 mLs (25 mg total) by mouth every 8 (eight) hours as needed for nausea or vomiting. 120 mL 1   No current facility-administered medications for this visit.    Allergies   Allergen Reactions   Aspirin  Anaphylaxis, Hives and Swelling    Swelling of lips and mouth    Penicillins Anaphylaxis, Hives and Swelling    Has patient had a PCN reaction causing immediate rash, facial/tongue/throat swelling, SOB or lightheadedness with hypotension: Yes Has patient had a PCN reaction causing severe rash involving mucus membranes or skin necrosis: Yes Has patient had a PCN reaction that required hospitalization No Has patient had a PCN reaction occurring within the last 10 years: No If all of the above answers are NO, then may proceed with Cephalosporin use.     Social History   Socioeconomic History   Marital status: Single    Spouse name: Not on file   Number of children: Not on file   Years of education: Not on file   Highest education level: Not on file  Occupational History   Not on file  Tobacco Use   Smoking status: Every Day    Current packs/day: 0.50    Average packs/day: 0.5 packs/day for 30.0 years (15.0 ttl pk-yrs)    Types: Cigarettes   Smokeless tobacco: Never  Vaping Use   Vaping status: Never Used  Substance and Sexual Activity   Alcohol use: No   Drug use: No   Sexual activity: Not on file  Other Topics Concern   Not on file  Social History Narrative   Lives with son.  She has another son.  One grandchild.     Social Drivers of Corporate Investment Banker Strain: Not on file  Food Insecurity: No Food Insecurity (03/24/2022)   Hunger Vital Sign    Worried About Running Out of Food in the Last Year: Never true    Ran Out of Food in the Last Year: Never true  Transportation Needs: No Transportation Needs (03/24/2022)   PRAPARE - Administrator, Civil Service (Medical): No    Lack of Transportation (Non-Medical): No  Physical Activity: Not on file  Stress: Not on file  Social Connections: Not on file  Intimate Partner Violence: Not At Risk (03/24/2022)   Humiliation, Afraid, Rape, and Kick questionnaire    Fear of  Current or Ex-Partner: No    Emotionally Abused: No    Physically Abused: No    Sexually Abused: No    Family History  Problem Relation Age of Onset   Hypertension Mother    Hypertension Father    Diabetes Mellitus II Father    Diabetes Father    CAD Other    Breast cancer Sister    Hypertension Brother    Hypertension Sister    Hypertension Sister    Hypertension Sister  Review of Systems:  As stated in the HPI and otherwise negative.   BP 130/74   Pulse 74   Ht 5' 4 (1.626 m)   Wt 92.1 kg   SpO2 97%   BMI 34.84 kg/m   Physical Examination: General: Well developed, well nourished, NAD  HEENT: OP clear, mucus membranes moist  SKIN: warm, dry. No rashes. Neuro: No focal deficits  Musculoskeletal: Muscle strength 5/5 all ext  Psychiatric: Mood and affect normal  Neck: No JVD, no carotid bruits, no thyromegaly, no lymphadenopathy.  Lungs:Clear bilaterally, no wheezes, rhonci, crackles Cardiovascular: Regular rate and rhythm. No murmurs, gallops or rubs. Abdomen:Soft. Bowel sounds present. Non-tender.  Extremities: No lower extremity edema. Pulses are 2 + in the bilateral DP/PT.  EKG:  EKG is not ordered today. The ekg ordered today demonstrates   Recent Labs: 09/07/2022: TSH 1.72 02/23/2023: ALT 20; BUN 19; Creatinine, Ser 1.07; Hemoglobin 13.0; Platelets 406; Potassium 3.7; Sodium 140   Lipid Panel    Component Value Date/Time   CHOL 111 02/19/2023 1003   TRIG 104 02/19/2023 1003   HDL 36 (L) 02/19/2023 1003   CHOLHDL 3.1 02/19/2023 1003   CHOLHDL 3.9 09/16/2022 0207   VLDL 13 09/16/2022 0207   LDLCALC 55 02/19/2023 1003   LDLCALC 104 (H) 09/07/2022 0000     Wt Readings from Last 3 Encounters:  06/08/23 92.1 kg  02/23/23 95.3 kg  01/01/23 97.4 kg    Assessment and Plan:   1. CAD without angina: No chest pain suggestive of angina. Continue Plavix , statin, Zetia  and Lopressor . She has anaphylaxis with ASA  2. HTN: BP is well controlled. No changes  today  3. Hyperlipidemia: LDL 55 in September 2024. Continue statin and Zetia   4. Tobacco abuse: Smoking cessation is advised.   Labs/ tests ordered today include:  No orders of the defined types were placed in this encounter.  Disposition:   F/U with me in 12 months   Signed, Lonni Cash, MD, [facility] 06/08/2023 4:08 PM    [facility] [address] [place], [place], [place]  [address] Phone: [phone]; Fax: [phone]

## 2023-06-08 NOTE — Patient Instructions (Signed)
Medication Instructions:  No changes *If you need a refill on your cardiac medications before your next appointment, please call your pharmacy*   Lab Work: none If you have labs (blood work) drawn today and your tests are completely normal, you will receive your results only by: [place] (if you have MyChart) OR A paper copy in the mail If you have any lab test that is abnormal or we need to change your treatment, we will call you to review the results.   Testing/Procedures: none   Follow-Up: At [facility], you and your health needs are our priority.  As part of our continuing mission to provide you with exceptional heart care, we have created designated Provider Care Teams.  These Care Teams include your primary Cardiologist (physician) and Advanced Practice Providers (APPs -  Physician Assistants and Nurse Practitioners) who all work together to provide you with the care you need, when you need it.  We recommend signing up for the patient portal called "MyChart".  Sign up information is provided on this After Visit Summary.  MyChart is used to connect with patients for Virtual Visits (Telemedicine).  Patients are able to view lab/test results, encounter notes, upcoming appointments, etc.  Non-urgent messages can be sent to your provider as well.   To learn more about what you can do with MyChart, go to [email or web address].    Your next appointment:   12 month(s)  Provider:   Lauree Chandler, MD     Other Instructions

## 2023-07-08 ENCOUNTER — Telehealth: Payer: Self-pay | Admitting: Cardiovascular Disease

## 2023-07-08 ENCOUNTER — Other Ambulatory Visit: Payer: Self-pay | Admitting: Urology

## 2023-07-08 NOTE — Telephone Encounter (Signed)
   Pre-operative Risk Assessment    Patient Name: [name]  DOB: [birth year] MRN: [id number]      Request for Surgical Clearance    Procedure:  CYSTOSCOPY/RIGHT RETROGRADE PYELOGRAM/URETEROSCOPY/HOLMIUM LASER/STENT PLACEMENT   Date of Surgery:  Clearance 07/16/23                                 Surgeon:  Dr. Sebastian Ache  Surgeon's Group or Practice Name:  [facility]  Phone number:  [phone] Fax number:  [phone]   Type of Clearance Requested:   - Pharmacy:  Hold Clopidogrel (Plavix) 5 day hold    Type of Anesthesia:  General    Additional requests/questions:    Signed, April Henson   07/08/2023, 2:01 PM

## 2023-07-09 NOTE — Telephone Encounter (Signed)
   Patient Name: [name]  DOB: 09-Jul-1965 MRN: [id number]  Primary Cardiologist: Verne Carrow, MD  Chart reviewed as part of pre-operative protocol coverage. Given past medical history and time since last visit, based on ACC/AHA guidelines, LYNEL FORESTER is at acceptable risk for the planned procedure without further cardiovascular testing.   The patient was advised that if she develops new symptoms prior to surgery to contact our office to arrange for a follow-up visit, and she verbalized understanding.  Patient can hold Plavix 5 days prior to procedure and should restart postprocedure when surgically safe.  I will route this recommendation to the requesting party via Epic fax function and remove from pre-op pool.  Please call with questions.  Napoleon Form, Leodis Rains, NP 07/09/2023, 11:36 AM

## 2023-07-12 NOTE — Progress Notes (Signed)
Anesthesia Review:  PCP: Cardiologist : McAlhany- clearance- 06/08/23  Chest x-ray : 02/23/23- 2 view  EKG : 02/23/23  Echo : 09/16/22  Stress test: Cardiac Cath :  09/15/22  Activity level:  Sleep Study/ CPAP : Fasting Blood Sugar :      / Checks Blood Sugar -- times a day:   Blood Thinner/ Instructions /Last Dose: ASA / Instructions/ Last Dose :

## 2023-07-13 ENCOUNTER — Encounter (HOSPITAL_COMMUNITY): Payer: Self-pay

## 2023-07-13 NOTE — Progress Notes (Signed)
COVID Vaccine Completed:  Date of COVID positive in last 90 days:  PCP - Fleet Contras, MD Cardiologist - Earney Hamburg, MD  Cardiac clearance in Epic dated 07-09-23  Chest x-ray - 02-23-23 Epic EKG - 02-23-23 Epic Stress Test -  ECHO - 09-16-22 Epic Cardiac Cath - 09-15-22 Epic Pacemaker/ICD device last checked: Spinal Cord Stimulator:  Bowel Prep -   Sleep Study -  CPAP -   Fasting Blood Sugar -  Checks Blood Sugar _____ times a day  Last dose of GLP1 agonist-  N/A GLP1 instructions:  Hold 7 days before surgery    Last dose of SGLT-2 inhibitors-  N/A SGLT-2 instructions:  Hold 3 days before surgery    Blood Thinner Instructions:  Plavix.  Hold x5 days.  Last dose:   Time: Aspirin Instructions: Last Dose:  Activity level:  Can go up a flight of stairs and perform activities of daily living without stopping and without symptoms of chest pain or shortness of breath.  Able to exercise without symptoms  Unable to go up a flight of stairs without symptoms of     Anesthesia review:  Hx of MI, HTN   Seizures -   Patient denies shortness of breath, fever, cough and chest pain at PAT appointment  Patient verbalized understanding of instructions that were given to them at the PAT appointment. Patient was also instructed that they will need to review over the PAT instructions again at home before surgery.

## 2023-07-13 NOTE — Patient Instructions (Addendum)
SURGICAL WAITING ROOM VISITATION  Patients having surgery or a procedure may have no more than 2 support people in the waiting area - these visitors may rotate.    Children under the age of 71 must have an adult with them who is not the patient.  Due to an increase in RSV and influenza rates and associated hospitalizations, children ages 41 and under may not visit patients in [facility] hospitals.  Visitors with respiratory illnesses are discouraged from visiting and should remain at home.  If the patient needs to stay at the hospital during part of their recovery, the visitor guidelines for inpatient rooms apply. Pre-op nurse will coordinate an appropriate time for 1 support person to accompany patient in pre-op.  This support person may not rotate.    Please refer to the [facility] website for the visitor guidelines for Inpatients (after your surgery is over and you are in a regular room).       Your procedure is scheduled on: 07/16/2023    Report to [facility] Main Entrance    Report to admitting at   7:45 AM   Call this number if you have problems the morning of surgery [phone]   Do not eat food  or drink liqiuids :After Midnight.           If you have questions, please contact your surgeon's office.  Oral Hygiene is also important to reduce your risk of infection.                                    Remember - BRUSH YOUR TEETH THE MORNING OF SURGERY WITH YOUR REGULAR TOOTHPASTE  DENTURES WILL BE REMOVED PRIOR TO SURGERY PLEASE DO NOT APPLY "Poly grip" OR ADHESIVES!!!   Do NOT smoke after Midnight   Stop all vitamins and herbal supplements 7 days before surgery.   Take these medicines the morning of surgery with A SIP OF WATER:   Amlodipine Atorvastatin Ezetimibe Keppra Metoprolol, Pantoprazole Solfenacin Hydrocodone if needed             Mounjaro - Hold 7 days before surgery (do not take after 07-08-23)                               You may not  have any metal on your body including hair pins, jewelry, and body piercing             Do not wear make-up, lotions, powders, perfumes, or deodorant  Do not wear nail polish including gel and S&S, artificial/acrylic nails, or any other type of covering on natural nails including finger and toenails. If you have artificial nails, gel coating, etc. that needs to be removed by a nail salon please have this removed prior to surgery or surgery may need to be canceled/ delayed if the surgeon/ anesthesia feels like they are unable to be safely monitored.   Do not shave  48 hours prior to surgery.       Do not bring valuables to the hospital. [facility] IS NOT  RESPONSIBLE   FOR VALUABLES.   Contacts, glasses, dentures or bridgework may not be worn into surgery.   DO NOT BRING YOUR HOME MEDICATIONS TO THE HOSPITAL. PHARMACY WILL DISPENSE MEDICATIONS LISTED ON YOUR MEDICATION LIST TO YOU DURING YOUR ADMISSION IN THE HOSPITAL!    Patients  discharged on the day of surgery will not be allowed to drive home.  Someone NEEDS to stay with you for the first 24 hours after anesthesia.              Please read over the following fact sheets you were given: IF YOU HAVE QUESTIONS ABOUT YOUR PRE-OP INSTRUCTIONS PLEASE CALL [phone] G[facility]   If you received a COVID test during your pre-op visit  it is requested that you wear a mask when out in public, stay away from anyone that may not be feeling well and notify your surgeon if you develop symptoms. If you test positive for Covid or have been in contact with anyone that has tested positive in the last 10 days please notify you surgeon.    [facility] - Preparing for Surgery Before surgery, you can play an important role.  Because skin is not sterile, your skin needs to be as free of germs as possible.  You can reduce the number of germs on your skin by washing with CHG (chlorahexidine gluconate) soap before surgery.  CHG is an antiseptic cleaner which kills  germs and bonds with the skin to continue killing germs even after washing. Please DO NOT use if you have an allergy to CHG or antibacterial soaps.  If your skin becomes reddened/irritated stop using the CHG and inform your nurse when you arrive at Short Stay. Do not shave (including legs and underarms) for at least 48 hours prior to the first CHG shower.  You may shave your face/neck. Please follow these instructions carefully:  1.  Shower with CHG Soap the night before surgery and the  morning of Surgery.  2.  If you choose to wash your hair, wash your hair first as usual with your  normal  shampoo.  3.  After you shampoo, rinse your hair and body thoroughly to remove the  shampoo.                           4.  Use CHG as you would any other liquid soap.  You can apply chg directly  to the skin and wash                       Gently with a scrungie or clean washcloth.  5.  Apply the CHG Soap to your body ONLY FROM THE NECK DOWN.   Do not use on face/ open                           Wound or open sores. Avoid contact with eyes, ears mouth and genitals (private parts).                       Wash face,  Genitals (private parts) with your normal soap.             6.  Wash thoroughly, paying special attention to the area where your surgery  will be performed.  7.  Thoroughly rinse your body with warm water from the neck down.  8.  DO NOT shower/wash with your normal soap after using and rinsing off  the CHG Soap.                9.  Pat yourself dry with a clean towel.            10.  Wear clean  pajamas.            11.  Place clean sheets on your bed the night of your first shower and do not  sleep with pets. Day of Surgery : Do not apply any lotions/deodorants the morning of surgery.  Please wear clean clothes to the hospital/surgery center.  FAILURE TO FOLLOW THESE INSTRUCTIONS MAY RESULT IN THE CANCELLATION OF YOUR SURGERY PATIENT SIGNATURE_________________________________  NURSE  SIGNATURE__________________________________  ________________________________________________________________________

## 2023-07-14 ENCOUNTER — Encounter (HOSPITAL_COMMUNITY): Payer: Self-pay

## 2023-07-14 ENCOUNTER — Other Ambulatory Visit: Payer: Self-pay

## 2023-07-14 ENCOUNTER — Encounter (HOSPITAL_COMMUNITY)
Admission: RE | Admit: 2023-07-14 | Discharge: 2023-07-14 | Disposition: A | Discharge status: Home / Self Care | Payer: 59 | Source: Ambulatory Visit | Attending: Urology | Admitting: Urology

## 2023-07-14 VITALS — Ht 64.0 in | Wt 203.0 lb

## 2023-07-14 DIAGNOSIS — I10 Essential (primary) hypertension: Secondary | ICD-10-CM

## 2023-07-14 DIAGNOSIS — D649 Anemia, unspecified: Secondary | ICD-10-CM

## 2023-07-14 HISTORY — DX: Acute myocardial infarction, unspecified: I21.9

## 2023-07-14 HISTORY — DX: Anemia, unspecified: D64.9

## 2023-07-14 HISTORY — DX: Personal history of urinary calculi: Z87.442

## 2023-07-14 NOTE — Progress Notes (Signed)
Anesthesia Chart Review   Case: [id number] Date/Time: 07/16/23 0945   Procedure: CYSTOSCOPY/RIGHT RETROGRADE PYELOGRAM/URETEROSCOPY/HOLMIUM LASER/STENT PLACEMENT (Right) - 60 MINUTES NEEDED FOR CASE   Anesthesia type: General   Pre-op diagnosis: RIGHT RENAL STONE, FLANK PAIN   Location: [facility] ROOM 07 / [facility]   Surgeons: [name]., MD       DISCUSSION:[name], CAD (NSTEMI 08/2022 s/p DES), right renal stone scheduled for above procedure 07/16/2023 with Dr. Sebastian Ache.   Pt last seen by cardiology 06/08/2023. Per OV note no chest pain, asymptomatic, HTN well controlled.   Per cardiology preoperative evaluation 07/09/2023, "Chart reviewed as part of pre-operative protocol coverage. Given past medical history and time since last visit, based on ACC/AHA guidelines, [name] is at acceptable risk for the planned procedure without further cardiovascular testing.    The patient was advised that if she develops new symptoms prior to surgery to contact our office to arrange for a follow-up visit, and she verbalized understanding.   Patient can hold Plavix 5 days prior to procedure and should restart postprocedure when surgically safe."  Last dose of Plavix 07/10/2023.   Pt unable to come into PAT for appointment due to transportation issues.  Labs to be done DOS. VS: Ht 5\' 4"  (1.626 m)   Wt 92.1 kg   BMI 34.84 kg/m   PROVIDERS: Joy Contras, MD is PCP   Primary Cardiologist: Joy Carrow, MD  LABS: Labs reviewed: Acceptable for surgery. and labs DOS, pt unable to come in for labs before surgery due to transportation (all labs ordered are listed, but only abnormal results are displayed)  Labs Reviewed - No data to display   IMAGES:   EKG:   CV: Echo 09/16/2022  1. Left ventricular ejection fraction, by estimation, is 50 to 55%. The  left ventricle has low normal function. The left ventricle has no regional  wall motion abnormalities.  There is moderate concentric left ventricular  hypertrophy. Left ventricular  diastolic parameters are consistent with Grade I diastolic dysfunction  (impaired relaxation). Elevated left ventricular end-diastolic pressure.   2. Right ventricular systolic function is normal. The right ventricular  size is normal. There is normal pulmonary artery systolic pressure. The  estimated right ventricular systolic pressure is 13.9 mmHg.   3. The mitral valve is normal in structure. Trivial mitral valve  regurgitation. No evidence of mitral stenosis.   4. The aortic valve is normal in structure. Aortic valve regurgitation is  not visualized. No aortic stenosis is present.   5. The inferior vena cava is normal in size with greater than 50%  respiratory variability, suggesting right atrial pressure of 3 mmHg.   6. Ascending aorta measurements are within normal limits for age when  indexed to body surface area.   Cardiac Cath 09/15/2022 1.  Severe proximal LAD stenosis, treated successfully with drug-eluting stent implantation using a 3.5 x 20 mm Synergy DES 2.  Widely patent left main, left circumflex, and RCA with no significant stenoses 3.  Normal LVEDP   Recommendation: Antiplatelet monotherapy with clopidogrel in this patient with aspirin allergy.  Needs to continue at least 12 months and long-term since this will be her only antiplatelet drug. Past Medical History:  Diagnosis Date   Anemia    Arthritis    Carpal tunnel syndrome    chronoic muscle spasms  & back pain   Chronic back pain    Fibroids    uterine   GERD (  gastroesophageal reflux disease)    History of kidney stones    Hypertension    Myocardial infarction (HCC)    Seizures (HCC)    last seizure 4-5 years ago/on keppra   Single stillborn delivery outcome    Smoker     Past Surgical History:  Procedure Laterality Date   ABDOMINAL HYSTERECTOMY     BACK SURGERY  02/2017   CESAREAN SECTION     1 time   CORONARY STENT  INTERVENTION N/A 09/15/2022   Procedure: CORONARY STENT INTERVENTION;  Surgeon: [name], MD;  Location[facility] INVASIVE CV LAB;  Service: Cardiovascular;  Laterality: N/A;   LEFT HEART CATH AND CORONARY ANGIOGRAPHY N/A 09/15/2022   Procedure: LEFT HEART CATH AND CORONARY ANGIOGRAPHY;  Surgeon: [name], MD;  Location: [facility] INVASIVE CV LAB;  Service: Cardiovascular;  Laterality: N/A;   LUMBAR LAMINECTOMY/DECOMPRESSION MICRODISCECTOMY Left 02/03/2017   Procedure: LEFT SIDED LUMBAR 5-SACRUM 1 MICRODISECTOMY;  Surgeon: [name], MD;  Location: [facility] OR;  Service: Orthopedics;  Laterality: Left;  LEFT SIDED LUMBAR 5-SACRUM 1 MICRODISECTOMY;    MYOMECTOMY     TUBAL LIGATION      MEDICATIONS:  amLODipine (NORVASC) 5 MG tablet   atorvastatin (LIPITOR) 80 MG tablet   carisoprodol (SOMA) 350 MG tablet   clopidogrel (PLAVIX) 75 MG tablet   diclofenac Sodium (VOLTAREN) 1 % GEL   ezetimibe (ZETIA) 10 MG tablet   folic acid (FOLVITE) 400 MCG tablet   HYDROcodone-acetaminophen (NORCO) 10-325 MG tablet   levETIRAcetam (KEPPRA) 500 MG tablet   metoprolol tartrate (LOPRESSOR) 25 MG tablet   MOUNJARO 2.5 MG/0.5ML Pen   nitroGLYCERIN (NITROSTAT) 0.4 MG SL tablet   ondansetron (ZOFRAN-ODT) 4 MG disintegrating tablet   pantoprazole (PROTONIX) 40 MG tablet   promethazine (PHENERGAN) 25 MG tablet   promethazine (PHENERGAN) 6.25 MG/5ML syrup   RELISTOR 150 MG TABS   solifenacin (VESICARE) 10 MG tablet   No current facility-administered medications for this encounter.      Jodell Cipro Ward, PA-C [facility] Pre-Surgical Testing [phone]

## 2023-07-14 NOTE — Anesthesia Preprocedure Evaluation (Signed)
Anesthesia Evaluation  Patient identified by MRN, date of birth, ID band Patient awake    Reviewed: Allergy & Precautions, H&P , NPO status , Patient's Chart, lab work & pertinent test results  Airway Mallampati: II  TM Distance: >3 FB Neck ROM: Full    Dental no notable dental hx.    Pulmonary neg pulmonary ROS, Current Smoker and Patient abstained from smoking.   Pulmonary exam normal breath sounds clear to auscultation       Cardiovascular hypertension, + Past MI and + Cardiac Stents  Normal cardiovascular exam Rhythm:Regular Rate:Normal     Neuro/Psych Seizures -,   negative psych ROS   GI/Hepatic Neg liver ROS,GERD  ,,  Endo/Other  negative endocrine ROS    Renal/GU negative Renal ROS  negative genitourinary   Musculoskeletal  (+) Arthritis ,    Abdominal   Peds negative pediatric ROS (+)  Hematology  (+) Blood dyscrasia, anemia   Anesthesia Other Findings   Reproductive/Obstetrics negative OB ROS                              Anesthesia Physical Anesthesia Plan  ASA: 3  Anesthesia Plan: General   Post-op Pain Management: Tylenol PO (pre-op)*   Induction: Intravenous  PONV Risk Score and Plan: 2 and Ondansetron and Dexamethasone  Airway Management Planned: LMA  Additional Equipment: None  Intra-op Plan:   Post-operative Plan: Extubation in OR  Informed Consent: I have reviewed the patients History and Physical, chart, labs and discussed the procedure including the risks, benefits and alternatives for the proposed anesthesia with the patient or authorized representative who has indicated his/her understanding and acceptance.     Dental advisory given  Plan Discussed with: CRNA  Anesthesia Plan Comments: (See PAT note 07/14/2023  [name], CAD (NSTEMI 08/2022 s/p DES), right renal stone scheduled for above procedure 07/16/2023 with Dr. Sebastian Ache.    )        Anesthesia Quick Evaluation

## 2023-07-16 ENCOUNTER — Encounter (HOSPITAL_COMMUNITY)
Admission: RE | Disposition: A | Discharge status: Home / Self Care | Payer: Self-pay | Source: Home / Self Care | Attending: Urology

## 2023-07-16 ENCOUNTER — Ambulatory Visit (HOSPITAL_COMMUNITY)
Admission: RE | Admit: 2023-07-16 | Discharge: 2023-07-16 | Disposition: A | Discharge status: Home / Self Care | Payer: 59 | Attending: Urology | Admitting: Urology

## 2023-07-16 ENCOUNTER — Ambulatory Visit (HOSPITAL_BASED_OUTPATIENT_CLINIC_OR_DEPARTMENT_OTHER): Payer: 59

## 2023-07-16 ENCOUNTER — Other Ambulatory Visit: Payer: Self-pay

## 2023-07-16 ENCOUNTER — Encounter (HOSPITAL_COMMUNITY): Payer: Self-pay | Admitting: Urology

## 2023-07-16 ENCOUNTER — Ambulatory Visit (HOSPITAL_COMMUNITY): Payer: 59

## 2023-07-16 ENCOUNTER — Ambulatory Visit (HOSPITAL_COMMUNITY): Payer: 59 | Admitting: Physician Assistant

## 2023-07-16 DIAGNOSIS — I2510 Atherosclerotic heart disease of native coronary artery without angina pectoris: Secondary | ICD-10-CM | POA: Insufficient documentation

## 2023-07-16 DIAGNOSIS — N132 Hydronephrosis with renal and ureteral calculous obstruction: Secondary | ICD-10-CM | POA: Diagnosis present

## 2023-07-16 DIAGNOSIS — Z90710 Acquired absence of both cervix and uterus: Secondary | ICD-10-CM | POA: Diagnosis not present

## 2023-07-16 DIAGNOSIS — Z6834 Body mass index (BMI) 34.0-34.9, adult: Secondary | ICD-10-CM | POA: Insufficient documentation

## 2023-07-16 DIAGNOSIS — I252 Old myocardial infarction: Secondary | ICD-10-CM | POA: Insufficient documentation

## 2023-07-16 DIAGNOSIS — Z955 Presence of coronary angioplasty implant and graft: Secondary | ICD-10-CM | POA: Diagnosis not present

## 2023-07-16 DIAGNOSIS — I10 Essential (primary) hypertension: Secondary | ICD-10-CM

## 2023-07-16 DIAGNOSIS — D649 Anemia, unspecified: Secondary | ICD-10-CM

## 2023-07-16 DIAGNOSIS — E669 Obesity, unspecified: Secondary | ICD-10-CM | POA: Diagnosis not present

## 2023-07-16 DIAGNOSIS — F17210 Nicotine dependence, cigarettes, uncomplicated: Secondary | ICD-10-CM

## 2023-07-16 DIAGNOSIS — Z7902 Long term (current) use of antithrombotics/antiplatelets: Secondary | ICD-10-CM | POA: Diagnosis not present

## 2023-07-16 DIAGNOSIS — M1990 Unspecified osteoarthritis, unspecified site: Secondary | ICD-10-CM | POA: Insufficient documentation

## 2023-07-16 DIAGNOSIS — N200 Calculus of kidney: Secondary | ICD-10-CM

## 2023-07-16 HISTORY — PX: CYSTOSCOPY/URETEROSCOPY/HOLMIUM LASER/STENT PLACEMENT: SHX6546

## 2023-07-16 LAB — BASIC METABOLIC PANEL
Anion gap: 9 (ref 5–15)
BUN: 11 mg/dL (ref 6–20)
CO2: 23 mmol/L (ref 22–32)
Calcium: 8.8 mg/dL — ABNORMAL LOW (ref 8.9–10.3)
Chloride: 105 mmol/L (ref 98–111)
Creatinine, Ser: 1.01 mg/dL — ABNORMAL HIGH (ref 0.44–1.00)
GFR, Estimated: >60 mL/min (ref >60)
Glucose, Bld: 91 mg/dL (ref 70–99)
Potassium: 4.6 mmol/L (ref 3.5–5.1)
Sodium: 137 mmol/L (ref 135–145)

## 2023-07-16 LAB — CBC
HCT: 40.5 % (ref 36.0–46.0)
Hemoglobin: 12.8 g/dL (ref 12.0–15.0)
MCH: 27.6 pg (ref 26.0–34.0)
MCHC: 31.6 g/dL (ref 30.0–36.0)
MCV: 87.5 fL (ref 80.0–100.0)
Platelets: 362 10*3/uL (ref 150–400)
RBC: 4.63 MIL/uL (ref 3.87–5.11)
RDW: 14.2 % (ref 11.5–15.5)
WBC: 6.4 10*3/uL (ref 4.0–10.5)
nRBC: 0 % (ref 0.0–0.2)

## 2023-07-16 LAB — GLUCOSE, CAPILLARY: Glucose-Capillary: 88 mg/dL (ref 70–99)

## 2023-07-16 SURGERY — CYSTOSCOPY/URETEROSCOPY/HOLMIUM LASER/STENT PLACEMENT
Anesthesia: General | Laterality: Right

## 2023-07-16 MED ORDER — OXYCODONE HCL 5 MG PO TABS
ORAL_TABLET | ORAL | Status: AC
Start: 2023-07-16 — End: 2023-07-16
  Administered 2023-07-16: 10 mg
  Filled 2023-07-16: qty 1

## 2023-07-16 MED ORDER — LIDOCAINE HCL (PF) 2 % IJ SOLN
INTRAMUSCULAR | Status: AC
  Filled 2023-07-16: qty 5

## 2023-07-16 MED ORDER — LACTATED RINGERS IV SOLN
INTRAVENOUS | Status: DC
Start: 2023-07-16 — End: 2023-07-16

## 2023-07-16 MED ORDER — MIDAZOLAM HCL 2 MG/2ML IJ SOLN
INTRAMUSCULAR | Status: AC
  Filled 2023-07-16: qty 2

## 2023-07-16 MED ORDER — FENTANYL CITRATE PF 50 MCG/ML IJ SOSY
PREFILLED_SYRINGE | INTRAMUSCULAR | Status: AC
  Filled 2023-07-16: qty 1

## 2023-07-16 MED ORDER — MIDAZOLAM HCL 5 MG/5ML IJ SOLN
INTRAMUSCULAR | Status: DC | PRN
  Administered 2023-07-16: 2 mg via INTRAVENOUS

## 2023-07-16 MED ORDER — FENTANYL CITRATE (PF) 100 MCG/2ML IJ SOLN
INTRAMUSCULAR | Status: AC
  Filled 2023-07-16: qty 2

## 2023-07-16 MED ORDER — ACETAMINOPHEN 500 MG PO TABS: 1000.0000 mg | ORAL_TABLET | Freq: Once | ORAL | Status: DC

## 2023-07-16 MED ORDER — INSULIN ASPART 100 UNIT/ML IJ SOLN: 0.0000 [IU] | INTRAMUSCULAR | Status: DC | PRN

## 2023-07-16 MED ORDER — DROPERIDOL 2.5 MG/ML IJ SOLN: 0.6250 mg | Freq: Once | INTRAMUSCULAR | Status: DC | PRN

## 2023-07-16 MED ORDER — IOHEXOL 300 MG/ML  SOLN
INTRAMUSCULAR | Status: DC | PRN
  Administered 2023-07-16: 7 mL

## 2023-07-16 MED ORDER — ORAL CARE MOUTH RINSE: 15.0000 mL | Freq: Once | OROMUCOSAL | Status: AC

## 2023-07-16 MED ORDER — LIDOCAINE HCL 1 % IJ SOLN: INTRAMUSCULAR | Status: DC | PRN

## 2023-07-16 MED ORDER — GENTAMICIN SULFATE 40 MG/ML IJ SOLN
5.0000 mg/kg | INTRAVENOUS | Status: AC
  Administered 2023-07-16: 348.4 mg via INTRAVENOUS
  Filled 2023-07-16: qty 8.75

## 2023-07-16 MED ORDER — HYDROMORPHONE HCL 1 MG/ML IJ SOLN
0.5000 mg | INTRAMUSCULAR | Status: AC | PRN
  Administered 2023-07-16 (×4): 0.5 mg via INTRAVENOUS

## 2023-07-16 MED ORDER — SODIUM CHLORIDE 0.9 % IR SOLN
Status: DC | PRN
  Administered 2023-07-16: 3000 mL via INTRAVESICAL

## 2023-07-16 MED ORDER — HYDROMORPHONE HCL 1 MG/ML IJ SOLN
INTRAMUSCULAR | Status: AC
  Filled 2023-07-16: qty 1

## 2023-07-16 MED ORDER — KETOROLAC TROMETHAMINE 30 MG/ML IJ SOLN
30.0000 mg | Freq: Once | INTRAMUSCULAR | Status: AC
  Administered 2023-07-16: 30 mg via INTRAVENOUS

## 2023-07-16 MED ORDER — OXYCODONE HCL 5 MG/5ML PO SOLN: 5.0000 mg | Freq: Once | ORAL | Status: AC | PRN

## 2023-07-16 MED ORDER — CHLORHEXIDINE GLUCONATE 0.12 % MT SOLN
15.0000 mL | Freq: Once | OROMUCOSAL | Status: AC
  Administered 2023-07-16: 15 mL via OROMUCOSAL

## 2023-07-16 MED ORDER — FENTANYL CITRATE PF 50 MCG/ML IJ SOSY
25.0000 ug | PREFILLED_SYRINGE | INTRAMUSCULAR | Status: DC | PRN
  Administered 2023-07-16 (×3): 50 ug via INTRAVENOUS

## 2023-07-16 MED ORDER — DEXAMETHASONE SODIUM PHOSPHATE 10 MG/ML IJ SOLN
INTRAMUSCULAR | Status: DC | PRN
  Administered 2023-07-16: 10 mg via INTRAVENOUS

## 2023-07-16 MED ORDER — DEXAMETHASONE SODIUM PHOSPHATE 10 MG/ML IJ SOLN
INTRAMUSCULAR | Status: AC
  Filled 2023-07-16: qty 1

## 2023-07-16 MED ORDER — SENNOSIDES-DOCUSATE SODIUM 8.6-50 MG PO TABS: 1.0000 | ORAL_TABLET | Freq: Two times a day (BID) | ORAL | 0 refills | Status: AC

## 2023-07-16 MED ORDER — FENTANYL CITRATE (PF) 100 MCG/2ML IJ SOLN
INTRAMUSCULAR | Status: DC | PRN
  Administered 2023-07-16 (×2): 50 ug via INTRAVENOUS

## 2023-07-16 MED ORDER — ONDANSETRON HCL 4 MG/2ML IJ SOLN
INTRAMUSCULAR | Status: DC | PRN
  Administered 2023-07-16: 4 mg via INTRAVENOUS

## 2023-07-16 MED ORDER — PROPOFOL 10 MG/ML IV BOLUS
INTRAVENOUS | Status: DC | PRN
  Administered 2023-07-16: 150 mg via INTRAVENOUS

## 2023-07-16 MED ORDER — OXYCODONE HCL 5 MG PO TABS
5.0000 mg | ORAL_TABLET | Freq: Once | ORAL | Status: AC | PRN
  Administered 2023-07-16: 5 mg via ORAL

## 2023-07-16 MED ORDER — ONDANSETRON HCL 4 MG/2ML IJ SOLN
INTRAMUSCULAR | Status: AC
  Filled 2023-07-16: qty 2

## 2023-07-16 MED ORDER — PROPOFOL 10 MG/ML IV BOLUS
INTRAVENOUS | Status: AC
  Filled 2023-07-16: qty 20

## 2023-07-16 MED ORDER — OXYCODONE HCL 5 MG PO TABS
ORAL_TABLET | ORAL | Status: AC
  Filled 2023-07-16: qty 2

## 2023-07-16 MED ORDER — LIDOCAINE 2% (20 MG/ML) 5 ML SYRINGE
INTRAMUSCULAR | Status: DC | PRN
  Administered 2023-07-16: 100 mg via INTRAVENOUS

## 2023-07-16 MED ORDER — OXYCODONE-ACETAMINOPHEN 5-325 MG PO TABS: 1.0000 | ORAL_TABLET | Freq: Four times a day (QID) | ORAL | 0 refills | Status: AC | PRN

## 2023-07-16 MED ORDER — ACETAMINOPHEN 10 MG/ML IV SOLN: 1000.0000 mg | Freq: Once | INTRAVENOUS | Status: DC | PRN

## 2023-07-16 MED ORDER — OXYCODONE HCL 5 MG PO TABS
10.0000 mg | ORAL_TABLET | Freq: Once | ORAL | Status: AC
  Administered 2023-07-16: 10 mg via ORAL

## 2023-07-16 MED ORDER — KETOROLAC TROMETHAMINE 30 MG/ML IJ SOLN
INTRAMUSCULAR | Status: DC
Start: 2023-07-16 — End: 2023-07-16
  Filled 2023-07-16: qty 1

## 2023-07-16 SURGICAL SUPPLY — 19 items
BAG URO CATCHER STRL LF (MISCELLANEOUS) ×1 IMPLANT
BASKET LASER NITINOL 1.9FR (BASKET) IMPLANT
CATH URETL OPEN END 6FR 70 (CATHETERS) ×1 IMPLANT
CLOTH BEACON ORANGE TIMEOUT ST (SAFETY) ×1 IMPLANT
EXTRACTOR STONE 1.7FRX115CM (UROLOGICAL SUPPLIES) IMPLANT
GLOVE SURG LX STRL 7.5 STRW (GLOVE) ×1 IMPLANT
GOWN STRL REUS W/ TWL XL LVL3 (GOWN DISPOSABLE) ×1 IMPLANT
GUIDEWIRE ANG ZIPWIRE 038X150 (WIRE) ×1 IMPLANT
GUIDEWIRE STR DUAL SENSOR (WIRE) ×1 IMPLANT
KIT TURNOVER KIT A (KITS) IMPLANT
LASER FIB FLEXIVA PULSE ID 365 (Laser) IMPLANT
MANIFOLD NEPTUNE II (INSTRUMENTS) ×1 IMPLANT
PACK CYSTO (CUSTOM PROCEDURE TRAY) ×1 IMPLANT
SHEATH NAVIGATOR HD 11/13X28 (SHEATH) IMPLANT
SHEATH NAVIGATOR HD 11/13X36 (SHEATH) IMPLANT
TRACTIP FLEXIVA PULS ID 200XHI (Laser) IMPLANT
TUBE PU 8FR 16IN ENFIT (TUBING) ×1 IMPLANT
TUBING CONNECTING 10 (TUBING) ×1 IMPLANT
TUBING UROLOGY SET (TUBING) ×1 IMPLANT

## 2023-07-16 NOTE — Discharge Instructions (Signed)
1 - You may have urinary urgency (bladder spasms) and bloody urine on / off x few days. This is normal. ° °2 - Call MD or go to ER for fever >102, severe pain / nausea / vomiting not relieved by medications, or acute change in medical status ° °

## 2023-07-16 NOTE — Op Note (Unsigned)
 [name], [name] MEDICAL RECORD NO: [id number] ACCOUNT NO: [id number] DATE OF BIRTH: Sep 20, 1965 FACILITY: [facility] LOCATION: [facility] PHYSICIAN: Sebastian Ache, MD  Operative Report   DATE OF PROCEDURE: 07/16/2023  PREOPERATIVE DIAGNOSIS:  Right renal stone, intermittent flank pain.  PROCEDURE PERFORMED: 1.  Cystoscopy, right retrograde pyelogram with interpretation. 2.  Right ureteroscopy with passing of stone.  ESTIMATED BLOOD LOSS:  Nil.  COMPLICATIONS:  None.  SPECIMENS:  Right renal stone for comprehensive analysis.  FINDINGS: 1.  Right intrarenal stone, lower mid pole. Estimated 3-4 mm. 2.  Resolution of all accessible stone and fragments following right ureteroscopy and basketing of stone. There was unremarkable retrograde pyelogram.  INDICATIONS:  The patient is a pleasant 58 year old lady with a history of significant obesity and some degenerative joint disease. She was found on workup of flank pain to have a small right intrarenal stone. At the time of her imaging, there was no  hydronephrosis. This was not overtly obstructing. She does have a history of intermittent right flank pain with radiation to the groin. She is quite concerned that this stone may be the source of her pain. We discussed that typically nonsurgical stones  do not generally cause significant pain; however, this may be a ball-valving intermittent obstruction. It is certainly possible. Options were discussed including observation versus treatment options including shockwave lithotripsy versus  ureteroscopy. Given the orientation of  the stone in relatively lower pole and her goal of completely stone-free, I felt like ureteroscopy offered the most definitive management. She presents for this today. Informed consent was obtained and placed in my medical record.  DESCRIPTION OF PROCEDURE:  The patient being [name], and procedure being right ureteroscopic stone manipulation was confirmed. Procedure  timeout was performed.  Intravenous antibiotics were verified.  General LMA anesthesia induced. The patient  was placed into a low lithotomy position.  A sterile field was created, prepped and draped the patient's vagina, introitus and proximal thighs using iodine.  Cystourethroscopy was performed using 21-French rigid cystoscope with offset lens.  Inspection  of urinary bladder revealed no diverticula, calcifications, papillary lesions.  The ureteral orifices were single bilaterally.  The right ureteral orifice was cannulated with a 6-French open-ended catheter and right retrograde pyelogram was obtained.  Right retrograde pyelogram demonstrated a single right ureter and a single system right kidney.  No filling defects or narrowing noted.  A 0.038 ZipWire was advanced to level of the upper pole and set aside as a safety wire.  An 8-French feeding tube placed in the urinary bladder for pressure release.  Semirigid ureteroscopy was performed of the distal four-fifths of right ureter  alongside a separate sensor working wire.  No mucosal abnormalities were found.  The ureter was somewhat narrow in caliber, did easily accommodate the 2 wires in semi-rigid ureteroscope.  The semi-rigid ureteroscope was then exchanged for a central  working wire for the single-channel flexible digital  ureteroscope using fluoroscopic guidance level of the kidney and the right kidney was inspected with all calyces x3. There was a single dominant stone in the lower mid location as anticipated. This was somewhat avoidant  appearance and did appear amenable to simple basketing. It was grasped with escape basket on its long axis and carefully navigated out in its entirety and set aside for comprehensive analysis. There was excellent hemostasis. No evidence of any sort of  perforation and we achieved the goals of the surgery today. Given the minimally traumatic nature, it was not felt that stenting would  be warranted. CT wire was  removed and achieved drainage. The patient tolerated the procedure well.  No immediate  periprocedural complications.  The patient was taken to the postanesthesia care unit in stable condition. Will plan to discharge home.   SHY D: 07/16/2023 10:18:09 am T: 07/16/2023 10:58:00 am  JOB: [id number]/ [id number]

## 2023-07-16 NOTE — H&P (Signed)
[name] is an 58 y.o. female.    Chief Complaint: Pre-OP RIGHT Ureteroscopic Stone Manipulation  HPI:   1 - Small Non-Obstructing Renal Stone - 3.1mm Rt lower mid papillary tip calc on CT 02/2023. No hydro. She does have intermitant Rt flank pain that radiates to groin that is non-positional c/w possible ball-valve obstruction.   PMH sig for CAD/MI/Stent/Plavix (follows [facility] cards), obesity, L spine surgery / disability, TAH (fibroids). Her PCP is [name].   Today " [name] " is seen to proceed with RIGHT ureteroscopy for small renal stone with possible intermittent obstruction. NO interval fevers. Most recent UCX negative. Has cards clearance on file.    Past Medical History:  Diagnosis Date   Anemia    Arthritis    Carpal tunnel syndrome    chronoic muscle spasms  & back pain   Chronic back pain    Fibroids    uterine   GERD (gastroesophageal reflux disease)    History of kidney stones    Hypertension    Myocardial infarction (HCC)    Seizures (HCC)    last seizure 4-5 years ago/on keppra   Single stillborn delivery outcome    Smoker     Past Surgical History:  Procedure Laterality Date   ABDOMINAL HYSTERECTOMY     BACK SURGERY  02/2017   CESAREAN SECTION     1 time   CORONARY STENT INTERVENTION N/A 09/15/2022   Procedure: CORONARY STENT INTERVENTION;  Surgeon: Joy Bollman, Kennedy;  Location: [facility] INVASIVE CV LAB;  Service: Cardiovascular;  Laterality: N/A;   LEFT HEART CATH AND CORONARY ANGIOGRAPHY N/A 09/15/2022   Procedure: LEFT HEART CATH AND CORONARY ANGIOGRAPHY;  Surgeon: Joy Bollman, Kennedy;  Location: [facility] INVASIVE CV LAB;  Service: Cardiovascular;  Laterality: N/A;   LUMBAR LAMINECTOMY/DECOMPRESSION MICRODISCECTOMY Left 02/03/2017   Procedure: LEFT SIDED LUMBAR 5-SACRUM 1 MICRODISECTOMY;  Surgeon: Joy Bamberg, Kennedy;  Location: [facility] OR;  Service: Orthopedics;  Laterality: Left;  LEFT SIDED LUMBAR 5-SACRUM 1 MICRODISECTOMY;    MYOMECTOMY     TUBAL  LIGATION      Family History  Problem Relation Age of Onset   Hypertension Mother    Hypertension Father    Diabetes Mellitus II Father    Diabetes Father    CAD Other    Breast cancer Sister    Hypertension Brother    Hypertension Sister    Hypertension Sister    Hypertension Sister    Social History:  reports that she has been smoking cigarettes. She has a 15 pack-year smoking history. She has never used smokeless tobacco. She reports that she does not drink alcohol and does not use drugs.  Allergies:  Allergies  Allergen Reactions   Aspirin Anaphylaxis, Hives and Swelling    Swelling of lips and mouth    Penicillins Anaphylaxis, Hives and Swelling    Has patient had a PCN reaction causing immediate rash, facial/tongue/throat swelling, SOB or lightheadedness with hypotension: Yes Has patient had a PCN reaction causing severe rash involving mucus membranes or skin necrosis: Yes Has patient had a PCN reaction that required hospitalization No Has patient had a PCN reaction occurring within the last 10 years: No If all of the above answers are "NO", then may proceed with Cephalosporin use.    Pork-Derived Products     Religious reasons    No medications prior to admission.    No results found for this or any previous visit (from the past 48 hours). No results  found.  Review of Systems  Constitutional:  Negative for chills and fever.  Genitourinary:  Positive for flank pain.    There were no vitals taken for this visit. Physical Exam Vitals reviewed.  HENT:     Head: Normocephalic.  Eyes:     Pupils: Pupils are equal, round, and reactive to light.  Cardiovascular:     Rate and Rhythm: Normal rate.  Pulmonary:     Effort: Pulmonary effort is normal.  Abdominal:     Comments: Stable truncal obesity.   Genitourinary:    Comments: No CVAT at present Musculoskeletal:        General: Normal range of motion.     Cervical back: Normal range of motion.  Skin:     General: Skin is warm.  Neurological:     Mental Status: She is alert.  Psychiatric:        Mood and Affect: Mood normal.      Assessment/Plan  Proceed as planned with RIGHT Ureteroscopic stone manipulation. Risks, benefits, alternatives, expected peri-op course discussed previously and reiterated today. We again discussed that this incidental renal stone may not be the source of there flank pain but removing will take this possibility out.   [name]., Kennedy 07/16/2023, 6:56 AM

## 2023-07-16 NOTE — Anesthesia Postprocedure Evaluation (Signed)
Anesthesia Post Note  Patient: [name]  Procedure(s) Performed: CYSTOSCOPY/RIGHT RETROGRADE PYELOGRAM/URETEROSCOPY/BASKETING OF STONE (Right)     Anesthesia Type: General Anesthetic complications: no   No notable events documented.  Last Vitals:  Vitals:   07/16/23 1300 07/16/23 1445  BP: (!) 199/96 (!) 165/102  Pulse: 68 80  Resp: (!) 30 12  Temp:    SpO2: 97% 98%    Last Pain:  Vitals:   07/16/23 1445  TempSrc:   PainSc: 2                  [place]

## 2023-07-16 NOTE — Anesthesia Procedure Notes (Signed)
Procedure Name: LMA Insertion Date/Time: 07/16/2023 9:53 AM  Performed by: Doran Clay, CRNAPre-anesthesia Checklist: Patient identified, Emergency Drugs available, Suction available and Patient being monitored Patient Re-evaluated:Patient Re-evaluated prior to induction Oxygen Delivery Method: Circle system utilized Preoxygenation: Pre-oxygenation with 100% oxygen Induction Type: IV induction LMA: LMA inserted LMA Size: 4.0 Tube type: Oral Number of attempts: 1 Placement Confirmation: positive ETCO2 and breath sounds checked- equal and bilateral Tube secured with: Tape Dental Injury: Teeth and Oropharynx as per pre-operative assessment

## 2023-07-16 NOTE — Transfer of Care (Signed)
Immediate Anesthesia Transfer of Care Note  Patient: [name]  Procedure(s) Performed: CYSTOSCOPY/RIGHT RETROGRADE PYELOGRAM/URETEROSCOPY/BASKETING OF STONE (Right)  Patient Location: PACU  Anesthesia Type:General  Level of Consciousness: sedated  Airway & Oxygen Therapy: Patient Spontanous Breathing and Patient connected to face mask  Post-op Assessment: Report given to RN and Post -op Vital signs reviewed and stable  Post vital signs: Reviewed and stable  Last Vitals:  Vitals Value Taken Time  BP 159/93 07/16/23 1024  Temp    Pulse 75 07/16/23 1025  Resp 17 07/16/23 1025  SpO2 100 % 07/16/23 1025  Vitals shown include unfiled device data.  Last Pain:  Vitals:   07/16/23 0804  TempSrc:   PainSc: 0-No pain         Complications: No notable events documented.

## 2023-07-16 NOTE — Brief Op Note (Signed)
07/16/2023  10:13 AM  PATIENT:  [name]  58 y.o. female  PRE-OPERATIVE DIAGNOSIS:  RIGHT RENAL STONE, FLANK PAIN  POST-OPERATIVE DIAGNOSIS:  RIGHT RENAL STONE, FLANK PAIN  PROCEDURE:  Procedure(s) with comments: CYSTOSCOPY/RIGHT RETROGRADE PYELOGRAM/URETEROSCOPY/BASKETING OF STONE (Right) - 60 MINUTES NEEDED FOR CASE  SURGEON:  Surgeons and Role:    * Manny, Delbert Phenix., MD - Primary  PHYSICIAN ASSISTANT:   ASSISTANTS: none   ANESTHESIA:   general  EBL:  minimal   BLOOD ADMINISTERED:none  DRAINS: none   LOCAL MEDICATIONS USED:  NONE  SPECIMEN:  Source of Specimen:  Rt renal stone  DISPOSITION OF SPECIMEN:   [facility] for compositional analysis  COUNTS:  YES  TOURNIQUET:  * No tourniquets in log *  DICTATION: .Other Dictation: Dictation Number [id number]  PLAN OF CARE: Discharge to home after PACU  PATIENT DISPOSITION:  PACU - hemodynamically stable.   Delay start of Pharmacological VTE agent (>24hrs) due to surgical blood loss or risk of bleeding: not applicable

## 2023-07-17 ENCOUNTER — Encounter (HOSPITAL_COMMUNITY): Payer: Self-pay | Admitting: Urology

## 2023-07-27 DIAGNOSIS — M47817 Spondylosis without myelopathy or radiculopathy, lumbosacral region: Secondary | ICD-10-CM | POA: Diagnosis not present

## 2023-07-27 DIAGNOSIS — Z79891 Long term (current) use of opiate analgesic: Secondary | ICD-10-CM | POA: Diagnosis not present

## 2023-07-27 DIAGNOSIS — M961 Postlaminectomy syndrome, not elsewhere classified: Secondary | ICD-10-CM | POA: Diagnosis not present

## 2023-07-27 DIAGNOSIS — M545 Low back pain, unspecified: Secondary | ICD-10-CM | POA: Diagnosis not present

## 2023-07-27 DIAGNOSIS — G894 Chronic pain syndrome: Secondary | ICD-10-CM | POA: Diagnosis not present

## 2023-08-26 DIAGNOSIS — M47817 Spondylosis without myelopathy or radiculopathy, lumbosacral region: Secondary | ICD-10-CM | POA: Diagnosis not present

## 2023-08-26 DIAGNOSIS — G894 Chronic pain syndrome: Secondary | ICD-10-CM | POA: Diagnosis not present

## 2023-08-26 DIAGNOSIS — M545 Low back pain, unspecified: Secondary | ICD-10-CM | POA: Diagnosis not present

## 2023-08-26 DIAGNOSIS — M791 Myalgia, unspecified site: Secondary | ICD-10-CM | POA: Diagnosis not present

## 2023-08-26 DIAGNOSIS — M961 Postlaminectomy syndrome, not elsewhere classified: Secondary | ICD-10-CM | POA: Diagnosis not present

## 2023-08-26 DIAGNOSIS — Z79891 Long term (current) use of opiate analgesic: Secondary | ICD-10-CM | POA: Diagnosis not present

## 2023-09-20 ENCOUNTER — Other Ambulatory Visit: Payer: Self-pay

## 2023-09-20 MED ORDER — ATORVASTATIN CALCIUM 80 MG PO TABS: 80.0000 mg | ORAL_TABLET | Freq: Every day | ORAL | 2 refills | Status: DC

## 2023-09-23 DIAGNOSIS — M961 Postlaminectomy syndrome, not elsewhere classified: Secondary | ICD-10-CM | POA: Diagnosis not present

## 2023-09-23 DIAGNOSIS — M47817 Spondylosis without myelopathy or radiculopathy, lumbosacral region: Secondary | ICD-10-CM | POA: Diagnosis not present

## 2023-09-23 DIAGNOSIS — M545 Low back pain, unspecified: Secondary | ICD-10-CM | POA: Diagnosis not present

## 2023-09-23 DIAGNOSIS — G894 Chronic pain syndrome: Secondary | ICD-10-CM | POA: Diagnosis not present

## 2023-09-23 DIAGNOSIS — Z79891 Long term (current) use of opiate analgesic: Secondary | ICD-10-CM | POA: Diagnosis not present

## 2023-09-23 DIAGNOSIS — M791 Myalgia, unspecified site: Secondary | ICD-10-CM | POA: Diagnosis not present

## 2023-09-24 ENCOUNTER — Other Ambulatory Visit: Payer: Self-pay | Admitting: Nurse Practitioner

## 2023-09-24 DIAGNOSIS — M47817 Spondylosis without myelopathy or radiculopathy, lumbosacral region: Secondary | ICD-10-CM

## 2023-10-21 DIAGNOSIS — Z79891 Long term (current) use of opiate analgesic: Secondary | ICD-10-CM | POA: Diagnosis not present

## 2023-10-21 DIAGNOSIS — M47817 Spondylosis without myelopathy or radiculopathy, lumbosacral region: Secondary | ICD-10-CM | POA: Diagnosis not present

## 2023-10-21 DIAGNOSIS — M791 Myalgia, unspecified site: Secondary | ICD-10-CM | POA: Diagnosis not present

## 2023-10-21 DIAGNOSIS — G894 Chronic pain syndrome: Secondary | ICD-10-CM | POA: Diagnosis not present

## 2023-10-21 DIAGNOSIS — M545 Low back pain, unspecified: Secondary | ICD-10-CM | POA: Diagnosis not present

## 2023-10-21 DIAGNOSIS — M961 Postlaminectomy syndrome, not elsewhere classified: Secondary | ICD-10-CM | POA: Diagnosis not present

## 2023-10-29 ENCOUNTER — Ambulatory Visit
Admission: RE | Admit: 2023-10-29 | Discharge: 2023-10-29 | Disposition: A | Discharge status: Home / Self Care | Source: Ambulatory Visit | Attending: Nurse Practitioner | Admitting: Nurse Practitioner

## 2023-10-29 DIAGNOSIS — M47817 Spondylosis without myelopathy or radiculopathy, lumbosacral region: Secondary | ICD-10-CM

## 2023-12-10 ENCOUNTER — Other Ambulatory Visit: Payer: Self-pay

## 2023-12-10 MED ORDER — CLOPIDOGREL BISULFATE 75 MG PO TABS: 75.0000 mg | ORAL_TABLET | Freq: Every day | ORAL | 1 refills | Status: DC

## 2023-12-15 ENCOUNTER — Other Ambulatory Visit: Payer: Self-pay | Admitting: Nurse Practitioner

## 2023-12-20 ENCOUNTER — Other Ambulatory Visit: Payer: Self-pay

## 2023-12-20 MED ORDER — METOPROLOL TARTRATE 25 MG PO TABS: 25.0000 mg | ORAL_TABLET | Freq: Two times a day (BID) | ORAL | 1 refills | Status: DC

## 2024-03-01 ENCOUNTER — Other Ambulatory Visit: Payer: Self-pay | Admitting: Internal Medicine

## 2024-03-01 DIAGNOSIS — Z1231 Encounter for screening mammogram for malignant neoplasm of breast: Secondary | ICD-10-CM

## 2024-03-08 ENCOUNTER — Ambulatory Visit
Admission: RE | Admit: 2024-03-08 | Discharge: 2024-03-08 | Disposition: A | Discharge status: Home / Self Care | Source: Ambulatory Visit | Attending: Nurse Practitioner | Admitting: Nurse Practitioner

## 2024-03-08 ENCOUNTER — Other Ambulatory Visit: Payer: Self-pay | Admitting: Nurse Practitioner

## 2024-03-08 DIAGNOSIS — M25552 Pain in left hip: Secondary | ICD-10-CM

## 2024-03-10 ENCOUNTER — Ambulatory Visit

## 2024-03-10 ENCOUNTER — Ambulatory Visit
Admission: RE | Admit: 2024-03-10 | Discharge: 2024-03-10 | Disposition: A | Source: Ambulatory Visit | Attending: Internal Medicine | Admitting: Internal Medicine

## 2024-03-10 DIAGNOSIS — Z1231 Encounter for screening mammogram for malignant neoplasm of breast: Secondary | ICD-10-CM

## 2024-05-05 ENCOUNTER — Telehealth (HOSPITAL_BASED_OUTPATIENT_CLINIC_OR_DEPARTMENT_OTHER): Payer: Self-pay

## 2024-05-05 NOTE — Telephone Encounter (Signed)
 1st attempt to reach pt regarding surgical clearance and the need for an IN OFFICE appointment.  Left pt a detailed message to call back and get that scheduled.

## 2024-05-05 NOTE — Telephone Encounter (Signed)
° °  Name: [name]  DOB: 09/02/1965  MRN: [id number]  Primary Cardiologist: Lonni Cash, MD  Chart reviewed as part of pre-operative protocol coverage. Because of [name]'s past medical history and time since last visit, she will require a follow-up in-office visit in order to better assess preoperative cardiovascular risk. Due for annual follow up in January 2026 with Dr. Cash.    Pre-op covering staff: - Please schedule appointment and call patient to inform them. If patient already had an upcoming appointment within acceptable timeframe, please add pre-op clearance to the appointment notes so provider is aware. - Please contact requesting surgeon's office via preferred method (i.e, phone, fax) to inform them of need for appointment prior to surgery.  This message will also be routed to pharmacy pool and/or Dr Cash for input on holding Plavix  as requested below so that this information is available to the clearing provider at time of patient's appointment.   Lamarr Satterfield, NP  05/05/2024, 1:39 PM

## 2024-05-05 NOTE — Telephone Encounter (Signed)
° °  Pre-operative Risk Assessment    Patient Name: [name]  DOB: 02-Feb-1966 MRN: [id number]   Date of last office visit: 06/08/23 [facility]  Date of next office visit: NA  Request for Surgical Clearance    Procedure:  Left TFESI L5 and S1   Date of Surgery:  Clearance TBD                                 Surgeon:  NA Surgeon's Group or Practice Name:  [facility]  Phone number:  [phone] Fax number:  [phone]   Type of Clearance Requested:   - Medical  - Pharmacy:  Hold Clopidogrel  (Plavix ) for 7 days    Type of Anesthesia:  Not Indicated   Additional requests/questions:    [name]   05/05/2024, 1:17 PM

## 2024-05-08 NOTE — Telephone Encounter (Signed)
 Pt scheduled in office 05/30/24.

## 2024-05-08 NOTE — Telephone Encounter (Signed)
 Pt scheduled in office 05/29/24.

## 2024-05-08 NOTE — Telephone Encounter (Signed)
 Left message on VM informing patient that she has an upcoming appointment already scheduled on 05/29/24 in which we can do preop -clearance then and to call us  if she has any questions or concerns.

## 2024-05-28 NOTE — Progress Notes (Unsigned)
 " Cardiology Office Note:    Date:  05/29/2024   ID:  [name], DOB [birth year], MRN [id number]  PCP:  Shelda Atlas, MD   [facility] Providers Cardiologist:  Lonni Cash, MD     Referring MD: Shelda Atlas, MD   Chief complaint: Annual follow-up CAD     History of Present Illness:   [name] is a 58 y.o. female with a hx of CAD, HTN, HLD, obesity, seizure disorder presenting to clinic today for follow-up of chronic cardiac conditions as well as preoperative clearance for upcoming Left TFESI L5 and S1, date TBD.  NSTEMI in April 2024, LHC demonstrating severe LAD stenosis treated with DES.  Echo LVEF 50-55%, normal RV function, no significant valvular disease.  Discharged on Plavix  monotherapy secondary to ASA allergy (anaphylaxis).  Last seen in the office in January 2025 by Dr. Lonni Cash, was doing well at that time.  Presents independently, appears stable from a cardiovascular standpoint. She denies chest pain, palpitations,  dark/tarry/bloody stools, hematuria, dizziness, syncope, edema, weight gain.  Does report DOE when she has to climb the flight of stairs in her townhome, unchanged from baseline.  Sleeps on 3 pillows as it is difficult to breathe when lying flat, states this has been her norm since prior to her NSTEMI in 2024. Is relatively inactive, physical mobility limited by significant back pain, hence the upcoming injection.  She is able to traverse the flight of stairs in her split-level townhome multiple times a day without issue, can perform light duties around the house.  She is able to perform sweeping, mopping, vacuuming, but will need to stop for breaks if performing for extended periods of time due to back pain and fatigue.  Would need to stop for a break due to back pain if she had to walk half a block.  Uses a cane to keep her steady, as she has had occasions where her knee/leg will give out secondary to her back  problems.  ROS:   Please see the history of present illness.    All other systems reviewed and are negative.     Past Medical History:  Diagnosis Date   Anemia    Arthritis    Carpal tunnel syndrome    chronoic muscle spasms  & back pain   Chronic back pain    Fibroids    uterine   GERD (gastroesophageal reflux disease)    History of kidney stones    Hypertension    Myocardial infarction (HCC)    Seizures (HCC)    last seizure 4-5 years ago/on keppra    Single stillborn delivery outcome    Smoker     Past Surgical History:  Procedure Laterality Date   ABDOMINAL HYSTERECTOMY     BACK SURGERY  02/2017   CESAREAN SECTION     1 time   CORONARY STENT INTERVENTION N/A 09/15/2022   Procedure: CORONARY STENT INTERVENTION;  Surgeon: Wonda Sharper, MD;  Location: [facility] INVASIVE CV LAB;  Service: Cardiovascular;  Laterality: N/A;   CYSTOSCOPY/URETEROSCOPY/HOLMIUM LASER/STENT PLACEMENT Right 07/16/2023   Procedure: CYSTOSCOPY/RIGHT RETROGRADE PYELOGRAM/URETEROSCOPY/BASKETING OF STONE;  Surgeon: Alvaro Ricardo KATHEE Mickey., MD;  Location: [facility] ORS;  Service: Urology;  Laterality: Right;  60 MINUTES NEEDED FOR CASE   LEFT HEART CATH AND CORONARY ANGIOGRAPHY N/A 09/15/2022   Procedure: LEFT HEART CATH AND CORONARY ANGIOGRAPHY;  Surgeon: Wonda Sharper, MD;  Location: [facility] INVASIVE CV LAB;  Service: Cardiovascular;  Laterality: N/A;   LUMBAR LAMINECTOMY/DECOMPRESSION MICRODISCECTOMY Left  02/03/2017   Procedure: LEFT SIDED LUMBAR 5-SACRUM 1 MICRODISECTOMY;  Surgeon: Beuford Anes, MD;  Location: [facility] OR;  Service: Orthopedics;  Laterality: Left;  LEFT SIDED LUMBAR 5-SACRUM 1 MICRODISECTOMY;    MYOMECTOMY     TUBAL LIGATION      Current Medications: Active Medications[1]   Allergies:   Aspirin , Penicillins, and Porcine (pork) protein-containing drug products   Social History   Socioeconomic History   Marital status: Single    Spouse name: Not on file   Number of children: Not on file   Years of  education: Not on file   Highest education level: Not on file  Occupational History   Not on file  Tobacco Use   Smoking status: Every Day    Current packs/day: 0.50    Average packs/day: 0.5 packs/day for 30.0 years (15.0 ttl pk-yrs)    Types: Cigarettes   Smokeless tobacco: Never  Vaping Use   Vaping status: Never Used  Substance and Sexual Activity   Alcohol use: No   Drug use: No   Sexual activity: Not on file  Other Topics Concern   Not on file  Social History Narrative   Lives with son.  She has another son.  One grandchild.     Social Drivers of Health   Tobacco Use: High Risk (05/29/2024)   Patient History    Smoking Tobacco Use: Every Day    Smokeless Tobacco Use: Never    Passive Exposure: Not on file  Financial Resource Strain: Not on file  Food Insecurity: No Food Insecurity (03/24/2022)   Hunger Vital Sign    Worried About Running Out of Food in the Last Year: Never true    Ran Out of Food in the Last Year: Never true  Transportation Needs: No Transportation Needs (03/24/2022)   PRAPARE - Administrator, Civil Service (Medical): No    Lack of Transportation (Non-Medical): No  Physical Activity: Not on file  Stress: Not on file  Social Connections: Not on file  Depression (PHQ2-9): Low Risk (01/07/2023)   Depression (PHQ2-9)    PHQ-2 Score: 0  Alcohol Screen: Not on file  Housing: Low Risk (03/24/2022)   Housing    Last Housing Risk Score: 0  Utilities: Not At Risk (03/24/2022)   AHC Utilities    Threatened with loss of utilities: No  Health Literacy: Not on file     Family History: The patient's family history includes Breast cancer in her sister; CAD in an other family member; Diabetes in her father; Diabetes Mellitus II in her father; Hypertension in her brother, father, mother, sister, sister, and sister.  EKGs/Labs/Other Studies Reviewed:    The following studies were reviewed today:  EKG Interpretation Date/Time:  Monday May 29 2024 10:13:18 EST Ventricular Rate:  83 PR Interval:  182 QRS Duration:  90 QT Interval:  372 QTC Calculation: 437 R Axis:   8  Text Interpretation: Normal sinus rhythm Cannot rule out Anterior infarct , age undetermined No significant change from prior studies Confirmed by Teairra Millar [phone]) on 05/29/2024 10:18:28 AM    Recent Labs: 07/16/2023: BUN 11; Creatinine, Ser 1.01; Hemoglobin 12.8; Platelets 362; Potassium 4.6; Sodium 137  Recent Lipid Panel    Component Value Date/Time   CHOL 111 02/19/2023 1003   TRIG 104 02/19/2023 1003   HDL 36 (L) 02/19/2023 1003   CHOLHDL 3.1 02/19/2023 1003   CHOLHDL 3.9 09/16/2022 0207   VLDL 13 09/16/2022 0207   LDLCALC 55  02/19/2023 1003   LDLCALC 104 (H) 09/07/2022 0000          Physical Exam:    VS:  BP 120/62   Pulse 83   Ht 5' 4 (1.626 m)   Wt 202 lb 3.2 oz (91.7 kg)   SpO2 99%   BMI 34.71 kg/m        Wt Readings from Last 3 Encounters:  05/29/24 202 lb 3.2 oz (91.7 kg)  07/16/23 203 lb (92.1 kg)  07/14/23 203 lb (92.1 kg)     GEN:  Obese, well developed, in no acute distress HEENT: Normal NECK: No carotid bruits CARDIAC:  S1-S2 normal, RRR, no murmurs, rubs, gallops RESPIRATORY:  Clear to auscultation without rales, wheezing or rhonchi  MUSCULOSKELETAL:  No edema; No deformity  SKIN: Warm and dry NEUROLOGIC:  Alert and oriented x 3 PSYCHIATRIC:  Normal affect       Assessment & Plan Preop cardiovascular exam According to the Revised Cardiac Risk Index (RCRI), her Perioperative Risk of Major Cardiac Event is (%): 0.9  Her Functional Capacity in METs is: 4.3 according to the Duke Activity Status Index (DASI). Therefore, based on ACC/AHA guidelines, patient would be at acceptable risk for the planned procedure without further cardiovascular testing. We are planning on performing an echo today given DOE w/ prior NSTEMI, this is not prohibitive of upcoming procedure and can proceed as planned.  Patient was  instructed to notify the office if any of her symptoms change prior to her surgery, as our recommendations may change.  Per Dr. Verlin: Genna to hold plavix  x 7 days as requested. Patient is to restart Plavix  as soon as possible following the procedure at the discretion of the surgeon.  I will fax this encounter to the requesting office following today's visit. NSTEMI (non-ST elevated myocardial infarction) (HCC) DOE (dyspnea on exertion) NSTEMI 2024 s/p pLAD lesion treated with 3.5 X20 DES, no significant disease of LCx, LM, RCA. Echo April 2024: LVEF 50-55%, normal RV function, no significant valvular disease. EKG: 83 bpm, NSR, cannot rule out anterior infarct, age undetermined Reports mild DOE when going up stairs, relatively inactive, unchanged from baseline, suspect due to deconditioning following musculoskeletal limitations, denies CP, SOB at rest, palpitations, near-syncope.  Will order echo to rule out cardiomyopathies or valvular abnormalities.  Continue to monitor for new symptoms Has been adherent to Plavix , denies missing doses Not on aspirin  2/2 anaphylactic allergy Denies significant bleeding/bruising Has not used/needed her nitroglycerin  Continue atorvastatin  80 mg daily, Lopressor  25 mg twice daily, nitroglycerin  0.4 mg SL as needed chest pain, clopidogrel  75 mg daily CMP today for drug monitoring Primary hypertension BPs reported well-controlled at home 120/62 in office today Continue amlodipine  5 mg daily Continue metoprolol  to tartrate 25 mg twice daily Hyperlipidemia LDL goal <55 Recheck fasting lipid and CMP today Continue atorvastatin  80 mg daily Continue Zetia  10 mg daily  If LDL not at goal, patient agreeable to discussing injectables with lipid clinic  Disposition: Follow-up in 1 year, or sooner if needed.  Proceed to ED with any new, worsening symptoms.            Medication Adjustments/Labs and Tests Ordered: Current medicines are reviewed at length  with the patient today.  Concerns regarding medicines are outlined above.  Orders Placed This Encounter  Procedures   Comp Met (CMET)   Lipid Profile   EKG 12-Lead   ECHOCARDIOGRAM COMPLETE   No orders of the defined types were placed in this encounter.  Patient Instructions  Medication Instructions:  Your physician recommends that you continue on your current medications as directed. Please refer to the Current Medication list given to you today.  *If you need a refill on your cardiac medications before your next appointment, please call your pharmacy*  Lab Work: This week: Fasting Lipids, CMET  If you have labs (blood work) drawn today and your tests are completely normal, you will receive your results only by: MyChart Message (if you have MyChart) OR A paper copy in the mail If you have any lab test that is abnormal or we need to change your treatment, we will call you to review the results.  Testing/Procedures: Your physician has requested that you have an echocardiogram. Echocardiography is a painless test that uses sound waves to create images of your heart. It provides your doctor with information about the size and shape of your heart and how well your hearts chambers and valves are working. This procedure takes approximately one hour. There are no restrictions for this procedure. Please do NOT wear cologne, perfume, aftershave, or lotions (deodorant is allowed). Please arrive 15 minutes prior to your appointment time.  Please note: We ask at that you not bring children with you during ultrasound (echo/ vascular) testing. Due to room size and safety concerns, children are not allowed in the ultrasound rooms during exams. Our front office staff cannot provide observation of children in our lobby area while testing is being conducted. An adult accompanying a patient to their appointment will only be allowed in the ultrasound room at the discretion of the ultrasound technician  under special circumstances. We apologize for any inconvenience.   Follow-Up: At [facility], you and your health needs are our priority.  As part of our continuing mission to provide you with exceptional heart care, our providers are all part of one team.  This team includes your primary Cardiologist (physician) and Advanced Practice Providers or APPs (Physician Assistants and Nurse Practitioners) who all work together to provide you with the care you need, when you need it.  Your next appointment:   1 year(s)  Provider:   Lonni Cash, MD            Signed, Ashten Prats E Marquest Gunkel, NP  05/29/2024 12:06 PM     [facility]     [1]  Current Meds  Medication Sig   amLODipine  (NORVASC ) 5 MG tablet Take 5 mg by mouth daily.   atorvastatin  (LIPITOR) 80 MG tablet Take 1 tablet (80 mg total) by mouth daily.   carisoprodol  (SOMA ) 350 MG tablet Take 350 mg by mouth daily as needed for muscle spasms.   clopidogrel  (PLAVIX ) 75 MG tablet Take 1 tablet (75 mg total) by mouth daily with breakfast.   diclofenac Sodium (VOLTAREN) 1 % GEL Apply 1 Application topically as needed for moderate pain.   ezetimibe  (ZETIA ) 10 MG tablet TAKE 1 TABLET(10 MG) BY MOUTH DAILY   folic acid  (FOLVITE ) 400 MCG tablet Take 400 mcg by mouth daily.   HYDROcodone -acetaminophen  (NORCO) 10-325 MG tablet Take 1 tablet by mouth 4 (four) times daily as needed.   levETIRAcetam  (KEPPRA ) 500 MG tablet Take 1 tablet (500 mg total) by mouth 2 (two) times daily.   metoprolol  tartrate (LOPRESSOR ) 25 MG tablet Take 1 tablet (25 mg total) by mouth 2 (two) times daily.   MOUNJARO 2.5 MG/0.5ML Pen Inject 2.5 mg into the skin once a week.   nitroGLYCERIN  (NITROSTAT ) 0.4 MG SL tablet Place 1 tablet (  0.4 mg total) under the tongue every 5 (five) minutes x 3 doses as needed for chest pain.   ondansetron  (ZOFRAN -ODT) 4 MG disintegrating tablet Take 1 tablet (4 mg total) by mouth every 8 (eight) hours as needed for  nausea or vomiting.   pantoprazole  (PROTONIX ) 40 MG tablet Take 40 mg by mouth daily before breakfast.    RELISTOR 150 MG TABS Take 3 tablets by mouth every morning.   solifenacin (VESICARE) 10 MG tablet Take 10 mg by mouth daily.   "

## 2024-05-28 NOTE — Telephone Encounter (Signed)
 Hey Dr. Verlin,  I see Mrs. Brunette tomorrow in clinic. She has an upcoming Left TFESI of L5 and S1 and her procedural team is asking for Plavix  hold guidelines, they are requesting it to be held for 7 days.  What are your recommendations?  Thank you, Zerick Prevette E Herminia Warren, NP

## 2024-05-29 ENCOUNTER — Encounter: Payer: Self-pay | Admitting: Emergency Medicine

## 2024-05-29 ENCOUNTER — Ambulatory Visit: Attending: Cardiology | Admitting: Emergency Medicine

## 2024-05-29 VITALS — BP 120/62 | HR 83 | Ht 64.0 in | Wt 202.2 lb

## 2024-05-29 DIAGNOSIS — Z01810 Encounter for preprocedural cardiovascular examination: Secondary | ICD-10-CM

## 2024-05-29 DIAGNOSIS — E785 Hyperlipidemia, unspecified: Secondary | ICD-10-CM

## 2024-05-29 DIAGNOSIS — R0609 Other forms of dyspnea: Secondary | ICD-10-CM

## 2024-05-29 DIAGNOSIS — I214 Non-ST elevation (NSTEMI) myocardial infarction: Secondary | ICD-10-CM | POA: Diagnosis not present

## 2024-05-29 DIAGNOSIS — I10 Essential (primary) hypertension: Secondary | ICD-10-CM

## 2024-05-29 NOTE — Patient Instructions (Signed)
 Medication Instructions:  Your physician recommends that you continue on your current medications as directed. Please refer to the Current Medication list given to you today.  *If you need a refill on your cardiac medications before your next appointment, please call your pharmacy*  Lab Work: This week: Fasting Lipids, CMET  If you have labs (blood work) drawn today and your tests are completely normal, you will receive your results only by: MyChart Message (if you have MyChart) OR A paper copy in the mail If you have any lab test that is abnormal or we need to change your treatment, we will call you to review the results.  Testing/Procedures: Your physician has requested that you have an echocardiogram. Echocardiography is a painless test that uses sound waves to create images of your heart. It provides your doctor with information about the size and shape of your heart and how well your hearts chambers and valves are working. This procedure takes approximately one hour. There are no restrictions for this procedure. Please do NOT wear cologne, perfume, aftershave, or lotions (deodorant is allowed). Please arrive 15 minutes prior to your appointment time.  Please note: We ask at that you not bring children with you during ultrasound (echo/ vascular) testing. Due to room size and safety concerns, children are not allowed in the ultrasound rooms during exams. Our front office staff cannot provide observation of children in our lobby area while testing is being conducted. An adult accompanying a patient to their appointment will only be allowed in the ultrasound room at the discretion of the ultrasound technician under special circumstances. We apologize for any inconvenience.   Follow-Up: At [facility], you and your health needs are our priority.  As part of our continuing mission to provide you with exceptional heart care, our providers are all part of one team.  This team includes  your primary Cardiologist (physician) and Advanced Practice Providers or APPs (Physician Assistants and Nurse Practitioners) who all work together to provide you with the care you need, when you need it.  Your next appointment:   1 year(s)  Provider:   Lonni Cash, MD

## 2024-05-29 NOTE — Assessment & Plan Note (Signed)
 BPs reported well-controlled at home 120/62 in office today Continue amlodipine  5 mg daily Continue metoprolol  to tartrate 25 mg twice daily

## 2024-05-29 NOTE — Assessment & Plan Note (Addendum)
 NSTEMI 2024 s/p pLAD lesion treated with 3.5 X20 DES, no significant disease of LCx, LM, RCA. Echo April 2024: LVEF 50-55%, normal RV function, no significant valvular disease. EKG: 83 bpm, NSR, cannot rule out anterior infarct, age undetermined Reports mild DOE when going up stairs, relatively inactive, unchanged from baseline, suspect due to deconditioning following musculoskeletal limitations, denies CP, SOB at rest, palpitations, near-syncope.  Will order echo to rule out cardiomyopathies or valvular abnormalities.  Continue to monitor for new symptoms Has been adherent to Plavix , denies missing doses Not on aspirin  2/2 anaphylactic allergy Denies significant bleeding/bruising Has not used/needed her nitroglycerin  Continue atorvastatin  80 mg daily, Lopressor  25 mg twice daily, nitroglycerin  0.4 mg SL as needed chest pain, clopidogrel  75 mg daily CMP today for drug monitoring

## 2024-06-03 ENCOUNTER — Ambulatory Visit: Payer: Self-pay | Admitting: Emergency Medicine

## 2024-06-03 LAB — COMPREHENSIVE METABOLIC PANEL WITH GFR
ALT: 30 IU/L (ref 0–32)
AST: 27 IU/L (ref 0–40)
Albumin: 3.8 g/dL (ref 3.8–4.9)
Alkaline Phosphatase: 97 IU/L (ref 49–135)
BUN/Creatinine Ratio: 12 (ref 9–23)
BUN: 10 mg/dL (ref 6–24)
Bilirubin Total: 0.2 mg/dL (ref 0.0–1.2)
CO2: 27 mmol/L (ref 20–29)
Calcium: 8.9 mg/dL (ref 8.7–10.2)
Chloride: 105 mmol/L (ref 96–106)
Creatinine, Ser: 0.82 mg/dL (ref 0.57–1.00)
Globulin, Total: 2.4 g/dL (ref 1.5–4.5)
Glucose: 86 mg/dL (ref 70–99)
Potassium: 4.2 mmol/L (ref 3.5–5.2)
Sodium: 144 mmol/L (ref 134–144)
Total Protein: 6.2 g/dL (ref 6.0–8.5)
eGFR: 82 mL/min/1.73

## 2024-06-03 LAB — LIPID PANEL
Chol/HDL Ratio: 2.5 ratio (ref 0.0–4.4)
Cholesterol, Total: 100 mg/dL (ref 100–199)
HDL: 40 mg/dL
LDL Chol Calc (NIH): 45 mg/dL (ref 0–99)
Triglycerides: 68 mg/dL (ref 0–149)
VLDL Cholesterol Cal: 15 mg/dL (ref 5–40)

## 2024-06-08 ENCOUNTER — Other Ambulatory Visit: Payer: Self-pay | Admitting: Cardiovascular Disease

## 2024-06-12 ENCOUNTER — Telehealth: Payer: Self-pay | Admitting: Cardiovascular Disease

## 2024-06-12 NOTE — Telephone Encounter (Addendum)
 I will send to preop APP to review the notes requesting office inquiring if pt has been cleared. Pt recently seen by Miriam Shams, NP 05/29/24.   [name] HERO [facility]   06/12/24  2:29 PM Note Ashari from [facility] calling on behalf of pt for an update on medical clearance for pt's upcoming procedure 02/05, please advise.       Ashari - [facility] to [name] HERO [facility]    06/12/24  2:27 PM Ext [phone] Fax #: [phone]

## 2024-06-12 NOTE — Telephone Encounter (Signed)
"  ° °  Patient Name: [name]  DOB: 08/26/1965 MRN: [id number]  Primary Cardiologist: Lonni Cash, MD  Chart reviewed as part of pre-operative protocol coverage. Given past medical history and time since last visit, based on ACC/AHA guidelines, [name] is at acceptable risk for the planned procedure without further cardiovascular testing.   Preop cardiovascular exam According to the Revised Cardiac Risk Index (RCRI), her Perioperative Risk of Major Cardiac Event is (%): 0.9   Her Functional Capacity in METs is: 4.3 according to the Duke Activity Status Index (DASI). Therefore, based on ACC/AHA guidelines, patient would be at acceptable risk for the planned procedure without further cardiovascular testing. We are planning on performing an echo today given DOE w/ prior NSTEMI, this is not prohibitive of upcoming procedure and can proceed as planned.  The patient was advised that if she develops new symptoms prior to surgery to contact our office to arrange for a follow-up visit, and she verbalized understanding.  I will route this recommendation to the requesting party via Epic fax function and remove from pre-op pool.  Please call with questions.  Lamarr Satterfield, NP 06/12/2024, 2:49 PM  "

## 2024-06-12 NOTE — Telephone Encounter (Signed)
 Ashari from [facility] calling on behalf of pt for an update on medical clearance for pt's upcoming procedure 02/05, please advise.

## 2024-06-16 ENCOUNTER — Other Ambulatory Visit: Payer: Self-pay | Admitting: Cardiovascular Disease

## 2024-06-23 NOTE — Telephone Encounter (Signed)
 Lipids Completed on 06/02/24.

## 2024-08-07 ENCOUNTER — Ambulatory Visit (HOSPITAL_COMMUNITY)
